# Patient Record
Sex: Male | Born: 1940
Health system: Southern US, Community
[De-identification: ages and names within clinical notes are randomized; demographics above are authoritative.]

## PROBLEM LIST (undated history)

## (undated) DIAGNOSIS — G709 Myoneural disorder, unspecified: Secondary | ICD-10-CM

## (undated) DIAGNOSIS — D689 Coagulation defect, unspecified: Secondary | ICD-10-CM

## (undated) DIAGNOSIS — I10 Essential (primary) hypertension: Secondary | ICD-10-CM

## (undated) DIAGNOSIS — T7840XA Allergy, unspecified, initial encounter: Secondary | ICD-10-CM

## (undated) DIAGNOSIS — K5790 Diverticulosis of intestine, part unspecified, without perforation or abscess without bleeding: Secondary | ICD-10-CM

## (undated) DIAGNOSIS — C61 Malignant neoplasm of prostate: Secondary | ICD-10-CM

## (undated) DIAGNOSIS — K219 Gastro-esophageal reflux disease without esophagitis: Secondary | ICD-10-CM

## (undated) DIAGNOSIS — J302 Other seasonal allergic rhinitis: Secondary | ICD-10-CM

## (undated) DIAGNOSIS — J019 Acute sinusitis, unspecified: Secondary | ICD-10-CM

## (undated) DIAGNOSIS — M1990 Unspecified osteoarthritis, unspecified site: Secondary | ICD-10-CM

## (undated) DIAGNOSIS — K4090 Unilateral inguinal hernia, without obstruction or gangrene, not specified as recurrent: Secondary | ICD-10-CM

## (undated) DIAGNOSIS — D369 Benign neoplasm, unspecified site: Secondary | ICD-10-CM

## (undated) DIAGNOSIS — E785 Hyperlipidemia, unspecified: Secondary | ICD-10-CM

## (undated) HISTORY — DX: Gastro-esophageal reflux disease without esophagitis: K21.9

## (undated) HISTORY — PX: TONSILLECTOMY: SUR1361

## (undated) HISTORY — DX: Acute sinusitis, unspecified: J01.90

## (undated) HISTORY — DX: Essential (primary) hypertension: I10

## (undated) HISTORY — DX: Myoneural disorder, unspecified: G70.9

## (undated) HISTORY — PX: EYE SURGERY: SHX253

## (undated) HISTORY — DX: Other seasonal allergic rhinitis: J30.2

## (undated) HISTORY — DX: Malignant neoplasm of prostate: C61

## (undated) HISTORY — DX: Diverticulosis of intestine, part unspecified, without perforation or abscess without bleeding: K57.90

## (undated) HISTORY — PX: HERNIA REPAIR: SHX51

## (undated) HISTORY — DX: Benign neoplasm, unspecified site: D36.9

## (undated) HISTORY — DX: Allergy, unspecified, initial encounter: T78.40XA

## (undated) HISTORY — DX: Unspecified osteoarthritis, unspecified site: M19.90

## (undated) HISTORY — DX: Coagulation defect, unspecified: D68.9

## (undated) HISTORY — PX: APPENDECTOMY: SHX54

## (undated) HISTORY — DX: Hyperlipidemia, unspecified: E78.5

---

## 1992-06-27 DIAGNOSIS — C61 Malignant neoplasm of prostate: Secondary | ICD-10-CM

## 1992-06-27 HISTORY — PX: OTHER SURGICAL HISTORY: SHX169

## 1992-06-27 HISTORY — DX: Malignant neoplasm of prostate: C61

## 1999-06-22 ENCOUNTER — Emergency Department (HOSPITAL_COMMUNITY): Admission: EM | Admit: 1999-06-22 | Discharge: 1999-06-22 | Payer: Self-pay | Admitting: Emergency Medicine

## 2002-03-04 ENCOUNTER — Ambulatory Visit (HOSPITAL_COMMUNITY): Admission: RE | Admit: 2002-03-04 | Discharge: 2002-03-04 | Payer: Self-pay | Admitting: Internal Medicine

## 2002-04-10 ENCOUNTER — Encounter: Admission: RE | Admit: 2002-04-10 | Discharge: 2002-04-10 | Payer: Self-pay | Admitting: Internal Medicine

## 2002-04-15 ENCOUNTER — Ambulatory Visit (HOSPITAL_COMMUNITY): Admission: RE | Admit: 2002-04-15 | Discharge: 2002-04-15 | Payer: Self-pay | Admitting: Internal Medicine

## 2002-04-23 ENCOUNTER — Ambulatory Visit: Admission: RE | Admit: 2002-04-23 | Discharge: 2002-04-23 | Payer: Self-pay | Admitting: Internal Medicine

## 2002-04-23 ENCOUNTER — Encounter (INDEPENDENT_AMBULATORY_CARE_PROVIDER_SITE_OTHER): Payer: Self-pay

## 2004-05-06 ENCOUNTER — Ambulatory Visit: Payer: Self-pay | Admitting: Internal Medicine

## 2006-06-27 DIAGNOSIS — D369 Benign neoplasm, unspecified site: Secondary | ICD-10-CM

## 2006-06-27 HISTORY — DX: Benign neoplasm, unspecified site: D36.9

## 2006-12-07 ENCOUNTER — Ambulatory Visit: Payer: Self-pay | Admitting: Internal Medicine

## 2006-12-07 LAB — CONVERTED CEMR LAB
ALT: 16 units/L (ref 0–40)
AST: 22 units/L (ref 0–37)
Albumin: 3.6 g/dL (ref 3.5–5.2)
Alkaline Phosphatase: 47 units/L (ref 39–117)
BUN: 11 mg/dL (ref 6–23)
Basophils Absolute: 0 10*3/uL (ref 0.0–0.1)
Basophils Relative: 0.2 % (ref 0.0–1.0)
Bilirubin, Direct: 0.1 mg/dL (ref 0.0–0.3)
CO2: 29 meq/L (ref 19–32)
Calcium: 8.9 mg/dL (ref 8.4–10.5)
Chloride: 102 meq/L (ref 96–112)
Cholesterol: 200 mg/dL (ref 0–200)
Creatinine, Ser: 1.1 mg/dL (ref 0.4–1.5)
Eosinophils Absolute: 0.1 10*3/uL (ref 0.0–0.6)
Eosinophils Relative: 2.3 % (ref 0.0–5.0)
GFR calc Af Amer: 86 mL/min
GFR calc non Af Amer: 71 mL/min
Glucose, Bld: 85 mg/dL (ref 70–99)
HCT: 42.5 % (ref 39.0–52.0)
HDL: 57.1 mg/dL (ref >39.0)
Hemoglobin: 15.2 g/dL (ref 13.0–17.0)
LDL Cholesterol: 131 mg/dL — ABNORMAL HIGH (ref 0–99)
Lymphocytes Relative: 35.4 % (ref 12.0–46.0)
MCHC: 35.8 g/dL (ref 30.0–36.0)
MCV: 88.4 fL (ref 78.0–100.0)
Monocytes Absolute: 0.5 10*3/uL (ref 0.2–0.7)
Monocytes Relative: 8.4 % (ref 3.0–11.0)
Neutro Abs: 3.1 10*3/uL (ref 1.4–7.7)
Neutrophils Relative %: 53.7 % (ref 43.0–77.0)
PSA: 1.65 ng/mL (ref 0.10–4.00)
Platelets: 209 10*3/uL (ref 150–400)
Potassium: 3.9 meq/L (ref 3.5–5.1)
RBC: 4.81 M/uL (ref 4.22–5.81)
RDW: 12.2 % (ref 11.5–14.6)
Sodium: 138 meq/L (ref 135–145)
TSH: 3.02 microintl units/mL (ref 0.35–5.50)
Total Bilirubin: 1.8 mg/dL — ABNORMAL HIGH (ref 0.3–1.2)
Total CHOL/HDL Ratio: 3.5
Total Protein: 6.8 g/dL (ref 6.0–8.3)
Triglycerides: 58 mg/dL (ref 0–149)
VLDL: 12 mg/dL (ref 0–40)
WBC: 5.7 10*3/uL (ref 4.5–10.5)

## 2007-02-07 ENCOUNTER — Ambulatory Visit: Payer: Self-pay | Admitting: Gastroenterology

## 2007-02-21 ENCOUNTER — Encounter: Payer: Self-pay | Admitting: Gastroenterology

## 2007-02-21 ENCOUNTER — Ambulatory Visit: Payer: Self-pay | Admitting: Gastroenterology

## 2007-02-21 ENCOUNTER — Encounter: Payer: Self-pay | Admitting: Internal Medicine

## 2007-02-21 LAB — HM COLONOSCOPY

## 2007-03-26 DIAGNOSIS — K219 Gastro-esophageal reflux disease without esophagitis: Secondary | ICD-10-CM | POA: Insufficient documentation

## 2007-03-26 DIAGNOSIS — K5730 Diverticulosis of large intestine without perforation or abscess without bleeding: Secondary | ICD-10-CM | POA: Insufficient documentation

## 2007-05-16 ENCOUNTER — Telehealth: Payer: Self-pay | Admitting: Internal Medicine

## 2007-05-16 ENCOUNTER — Ambulatory Visit: Payer: Self-pay | Admitting: Internal Medicine

## 2007-05-16 LAB — CONVERTED CEMR LAB
ALT: 230 units/L — ABNORMAL HIGH (ref 0–53)
AST: 277 units/L — ABNORMAL HIGH (ref 0–37)
Albumin: 4 g/dL (ref 3.5–5.2)
Alkaline Phosphatase: 89 units/L (ref 39–117)
Amylase: 3384 units/L — ABNORMAL HIGH (ref 27–131)
BUN: 12 mg/dL (ref 6–23)
Basophils Absolute: 0 10*3/uL (ref 0.0–0.1)
Basophils Relative: 0 % (ref 0.0–1.0)
Bilirubin, Direct: 0.9 mg/dL — ABNORMAL HIGH (ref 0.0–0.3)
CO2: 31 meq/L (ref 19–32)
Calcium: 10 mg/dL (ref 8.4–10.5)
Chloride: 98 meq/L (ref 96–112)
Creatinine, Ser: 1.1 mg/dL (ref 0.4–1.5)
Eosinophils Absolute: 0 10*3/uL (ref 0.0–0.6)
Eosinophils Relative: 0.3 % (ref 0.0–5.0)
GFR calc Af Amer: 86 mL/min
GFR calc non Af Amer: 71 mL/min
Glucose, Bld: 102 mg/dL — ABNORMAL HIGH (ref 70–99)
HCT: 44 % (ref 39.0–52.0)
Hemoglobin: 15.8 g/dL (ref 13.0–17.0)
Lymphocytes Relative: 12.2 % (ref 12.0–46.0)
MCHC: 35.9 g/dL (ref 30.0–36.0)
MCV: 91.2 fL (ref 78.0–100.0)
Monocytes Absolute: 0.9 10*3/uL — ABNORMAL HIGH (ref 0.2–0.7)
Monocytes Relative: 7.1 % (ref 3.0–11.0)
Neutro Abs: 9.8 10*3/uL — ABNORMAL HIGH (ref 1.4–7.7)
Neutrophils Relative %: 80.4 % — ABNORMAL HIGH (ref 43.0–77.0)
PSA: 1.46 ng/mL (ref 0.10–4.00)
Platelets: 215 10*3/uL (ref 150–400)
Potassium: 4 meq/L (ref 3.5–5.1)
RBC: 4.82 M/uL (ref 4.22–5.81)
RDW: 12.3 % (ref 11.5–14.6)
Sodium: 137 meq/L (ref 135–145)
Total Bilirubin: 3.5 mg/dL — ABNORMAL HIGH (ref 0.3–1.2)
Total Protein: 7.2 g/dL (ref 6.0–8.3)
WBC: 12.2 10*3/uL — ABNORMAL HIGH (ref 4.5–10.5)

## 2007-05-17 ENCOUNTER — Telehealth: Payer: Self-pay | Admitting: Internal Medicine

## 2007-05-17 ENCOUNTER — Encounter: Admission: RE | Admit: 2007-05-17 | Discharge: 2007-05-17 | Payer: Self-pay | Admitting: Internal Medicine

## 2007-05-18 ENCOUNTER — Ambulatory Visit: Payer: Self-pay | Admitting: Internal Medicine

## 2007-05-18 LAB — CONVERTED CEMR LAB
ALT: 122 units/L — ABNORMAL HIGH (ref 0–53)
AST: 62 units/L — ABNORMAL HIGH (ref 0–37)
Albumin: 4 g/dL (ref 3.5–5.2)
Bilirubin, Direct: 0.3 mg/dL (ref 0.0–0.3)
Lipase: 47 units/L (ref 11.0–59.0)
Total Bilirubin: 2 mg/dL — ABNORMAL HIGH (ref 0.3–1.2)

## 2007-05-19 ENCOUNTER — Encounter: Payer: Self-pay | Admitting: Internal Medicine

## 2007-06-01 ENCOUNTER — Telehealth: Payer: Self-pay | Admitting: Internal Medicine

## 2007-06-05 ENCOUNTER — Encounter (INDEPENDENT_AMBULATORY_CARE_PROVIDER_SITE_OTHER): Payer: Self-pay | Admitting: General Surgery

## 2007-06-05 ENCOUNTER — Ambulatory Visit (HOSPITAL_COMMUNITY): Admission: RE | Admit: 2007-06-05 | Discharge: 2007-06-05 | Payer: Self-pay | Admitting: General Surgery

## 2007-08-06 DIAGNOSIS — J309 Allergic rhinitis, unspecified: Secondary | ICD-10-CM | POA: Insufficient documentation

## 2009-03-10 ENCOUNTER — Ambulatory Visit: Payer: Self-pay | Admitting: Internal Medicine

## 2009-03-10 DIAGNOSIS — E785 Hyperlipidemia, unspecified: Secondary | ICD-10-CM | POA: Insufficient documentation

## 2009-03-19 ENCOUNTER — Telehealth: Payer: Self-pay | Admitting: Internal Medicine

## 2009-03-27 LAB — CONVERTED CEMR LAB
AST: 24 units/L (ref 0–37)
Basophils Absolute: 0 10*3/uL (ref 0.0–0.1)
CO2: 32 meq/L (ref 19–32)
Calcium: 9 mg/dL (ref 8.4–10.5)
Eosinophils Absolute: 0.1 10*3/uL (ref 0.0–0.7)
Eosinophils Relative: 2.4 % (ref 0.0–5.0)
GFR calc non Af Amer: 63.53 mL/min (ref >60)
Glucose, Bld: 94 mg/dL (ref 70–99)
HDL: 46 mg/dL (ref >39.00)
LDL Cholesterol: 120 mg/dL — ABNORMAL HIGH (ref 0–99)
Lymphocytes Relative: 31.8 % (ref 12.0–46.0)
Monocytes Absolute: 0.6 10*3/uL (ref 0.1–1.0)
Neutrophils Relative %: 55.4 % (ref 43.0–77.0)
Platelets: 188 10*3/uL (ref 150.0–400.0)
Potassium: 4.7 meq/L (ref 3.5–5.1)
Sodium: 139 meq/L (ref 135–145)
Total Bilirubin: 2.3 mg/dL — ABNORMAL HIGH (ref 0.3–1.2)
Total Protein: 7.4 g/dL (ref 6.0–8.3)
Triglycerides: 93 mg/dL (ref 0.0–149.0)
VLDL: 18.6 mg/dL (ref 0.0–40.0)

## 2009-04-28 ENCOUNTER — Telehealth: Payer: Self-pay | Admitting: Internal Medicine

## 2009-05-27 HISTORY — PX: CHOLECYSTECTOMY: SHX55

## 2009-10-14 ENCOUNTER — Ambulatory Visit: Payer: Self-pay | Admitting: Internal Medicine

## 2009-11-10 ENCOUNTER — Ambulatory Visit: Payer: Self-pay | Admitting: Internal Medicine

## 2009-11-10 DIAGNOSIS — M549 Dorsalgia, unspecified: Secondary | ICD-10-CM | POA: Insufficient documentation

## 2009-11-10 DIAGNOSIS — C61 Malignant neoplasm of prostate: Secondary | ICD-10-CM | POA: Insufficient documentation

## 2009-12-24 ENCOUNTER — Telehealth: Payer: Self-pay | Admitting: Internal Medicine

## 2010-02-22 ENCOUNTER — Telehealth: Payer: Self-pay | Admitting: Internal Medicine

## 2010-02-23 ENCOUNTER — Ambulatory Visit: Payer: Self-pay | Admitting: Internal Medicine

## 2010-02-24 ENCOUNTER — Encounter (INDEPENDENT_AMBULATORY_CARE_PROVIDER_SITE_OTHER): Payer: Self-pay | Admitting: *Deleted

## 2010-03-16 ENCOUNTER — Ambulatory Visit: Payer: Self-pay | Admitting: Family Medicine

## 2010-03-16 DIAGNOSIS — J209 Acute bronchitis, unspecified: Secondary | ICD-10-CM | POA: Insufficient documentation

## 2010-05-25 ENCOUNTER — Encounter (INDEPENDENT_AMBULATORY_CARE_PROVIDER_SITE_OTHER): Payer: Self-pay | Admitting: *Deleted

## 2010-06-01 ENCOUNTER — Encounter (INDEPENDENT_AMBULATORY_CARE_PROVIDER_SITE_OTHER): Payer: Self-pay | Admitting: *Deleted

## 2010-06-02 ENCOUNTER — Ambulatory Visit: Payer: Self-pay | Admitting: Gastroenterology

## 2010-06-18 ENCOUNTER — Ambulatory Visit: Payer: Self-pay | Admitting: Gastroenterology

## 2010-06-30 ENCOUNTER — Encounter: Payer: Self-pay | Admitting: Gastroenterology

## 2010-07-18 ENCOUNTER — Encounter: Payer: Self-pay | Admitting: Internal Medicine

## 2010-07-27 NOTE — Progress Notes (Signed)
Summary: rx for vaccine  Phone Note Call from Patient Call back at Home Phone [phone]   Caller: Patient Call For: Birdie Sons MD Summary of Call: pt would like rx [facility] [phone]  for shingle vaccine. please call pt back Initial call taken by: Heron Sabins,  February 22, 2010 10:44 AM  Follow-up for Phone Call        Wife notified we have vaccine here and needs appt to get it. Follow-up by: Gladis Riffle, RN,  February 22, 2010 2:27 PM

## 2010-07-27 NOTE — Assessment & Plan Note (Signed)
Summary: shingles vacc/cjr  Nurse Visit   Allergies: No Known Drug Allergies  Immunizations Administered:  Zostavax # 1:    Vaccine Type: Zostavax    Site: left deltoid    Mfr: Merck    Dose: 0.5 ml    Route: [place]    Given by: Gladis Riffle, RN    Exp. Date: 01/29/2011    Lot #: [id number]    VIS given: 04/08/05 given February 23, 2010.  Orders Added: 1)  Zoster (Shingles) Vaccine Live [90736] 2)  Admin 1st Vaccine [phone]

## 2010-07-27 NOTE — Letter (Signed)
Summary: [facility] Instructions  [facility]  [address] [place], [place] [address]   Phone: [phone]  Fax: [phone]       Juan Lin    1940/07/23    MRN: [id number]        Procedure Day Dorna Bloom:  Farrell Ours  06/18/10     Arrival Time:  2:30pm     Procedure Time: 3:30pm     Location of Procedure:                    Juliann Pares  [facility] (4th Floor)                       PREPARATION FOR COLONOSCOPY WITH MOVIPREP   Starting 5 days prior to your procedure  SUNDAY 12/18  do not eat nuts, seeds, popcorn, corn, beans, peas,  salads, or any raw vegetables.  Do not take any fiber supplements (e.g. Metamucil, Citrucel, and Benefiber).  THE DAY BEFORE YOUR PROCEDURE         DATE:  THURSDAY 12/22  1.  Drink clear liquids the entire day-NO SOLID FOOD  2.  Do not drink anything colored red or purple.  Avoid juices with pulp.  No orange juice.  3.  Drink at least 64 oz. (8 glasses) of fluid/clear liquids during the day to prevent dehydration and help the prep work efficiently.  CLEAR LIQUIDS INCLUDE: Water Jello Ice Popsicles Tea (sugar ok, no milk/cream) Powdered fruit flavored drinks Coffee (sugar ok, no milk/cream) Gatorade Juice: apple, white grape, white cranberry  Lemonade Clear bullion, consomm, broth Carbonated beverages (any kind) Strained chicken noodle soup Hard Candy                             4.  In the morning, mix first dose of MoviPrep solution:    Empty 1 Pouch A and 1 Pouch B into the disposable container    Add lukewarm drinking water to the top line of the container. Mix to dissolve    Refrigerate (mixed solution should be used within 24 hrs)  5.  Begin drinking the prep at 5:00 p.m. The MoviPrep container is divided by 4 marks.   Every 15 minutes drink the solution down to the next mark (approximately 8 oz) until the full liter is complete.   6.  Follow completed prep with 16 oz of clear liquid of your choice (Nothing  red or purple).  Continue to drink clear liquids until bedtime.  7.  Before going to bed, mix second dose of MoviPrep solution:    Empty 1 Pouch A and 1 Pouch B into the disposable container    Add lukewarm drinking water to the top line of the container. Mix to dissolve    Refrigerate  THE DAY OF YOUR PROCEDURE      DATE:  FRIDAY  12/23  Beginning at  10:30 a.m. (5 hours before procedure):         1. Every 15 minutes, drink the solution down to the next mark (approx 8 oz) until the full liter is complete.  2. Follow completed prep with 16 oz. of clear liquid of your choice.    3. You may drink clear liquids until  1:30pm  (2 HOURS BEFORE PROCEDURE).   MEDICATION INSTRUCTIONS  Unless otherwise instructed, you should take regular prescription medications with a small sip of water   as early  as possible the morning of your procedure.         OTHER INSTRUCTIONS  You will need a responsible adult at least 72 years of age to accompany you and drive you home.   This person must remain in the waiting room during your procedure.  Wear loose fitting clothing that is easily removed.  Leave jewelry and other valuables at home.  However, you may wish to bring a book to read or  an iPod/MP3 player to listen to music as you wait for your procedure to start.  Remove all body piercing jewelry and leave at home.  Total time from sign-in until discharge is approximately 2-3 hours.  You should go home directly after your procedure and rest.  You can resume normal activities the  day after your procedure.  The day of your procedure you should not:   Drive   Make legal decisions   Operate machinery   Drink alcohol   Return to work  You will receive specific instructions about eating, activities and medications before you leave.    The above instructions have been reviewed and explained to me by   Wyona Almas RN  June 02, 2010 9:31 AM     I fully understand and  can verbalize these instructions _____________________________ Date _________

## 2010-07-27 NOTE — Letter (Signed)
Summary: Colonoscopy Letter  [facility]  [address] [place], [place] [address]   Phone: [phone]  Fax: [phone]      February 24, 2010 MRN: [id number]   Juan Lin [address] [place], [place]  [address]   Dear Mr. VELAZCO,   According to your medical record, it is time for you to schedule a Colonoscopy. The American Cancer Society recommends this procedure as a method to detect early colon cancer. Patients with a family history of colon cancer, or a personal history of colon polyps or inflammatory bowel disease are at increased risk.  This letter has beeen generated based on the recommendations made at the time of your procedure. If you feel that in your particular situation this may no longer apply, please contact our office.  Please call our office at [phone] to schedule this appointment or to update your records at your earliest convenience.  Thank you for cooperating with [place] to provide you with the very best care possible.   Sincerely,   Vania Rea. Jarold Motto, M.D.  [facility] Gastroenterology Division [phone]

## 2010-07-27 NOTE — Progress Notes (Signed)
Summary: REQ FOR SHINGLES VACCINE (ZOSTAVAX)  Phone Note Call from Patient   Caller: Patient  [phone] Reason for Call: Talk to Nurse, Talk to Doctor Summary of Call: Pt called to ck on receiving shingles (zostavax) vaccine or a Rx for same.... Pt can be reached at [phone] when / if available.Marland KitchenMarland KitchenMarland KitchenPt understands that Dr Cato Mulligan / Alvino Chapel, RN out of office till 12/29/2009... Pt adv he would wait to speak with them once they return.  Initial call taken by: Debbra Riding,  December 24, 2009 10:41 AM  Follow-up for Phone Call        Left message that either is possible as of right now.  He is to check with insurance for coverage and let us know which he would like to do. Follow-up by: Gladis Riffle, RN,  December 30, 2009 4:18 PM

## 2010-07-27 NOTE — Miscellaneous (Signed)
Summary: [facility] Previsit/prep  Clinical Lists Changes  Medications: Added new medication of MOVIPREP 100 GM  SOLR (PEG-KCL-NACL-NASULF-NA ASC-C) As per prep instructions. - Signed Rx of MOVIPREP 100 GM  SOLR (PEG-KCL-NACL-NASULF-NA ASC-C) As per prep instructions.;  #1 x 0;  Signed;  Entered by: Wyona Almas RN;  Authorized by: Mardella Layman MD [facility];  Method used: Electronically to [id number] [address], [place], [place], [place]  [address], Ph: [phone] or [phone], Fax: [phone] Observations: Added new observation of NKA: T (06/02/2010 8:48)    Prescriptions: MOVIPREP 100 GM  SOLR (PEG-KCL-NACL-NASULF-NA ASC-C) As per prep instructions.  #1 x 0   Entered by:   Wyona Almas RN   Authorized by:   Mardella Layman MD [facility]   Signed by:   Wyona Almas RN on 06/02/2010   Method used:   Electronically to        [facility] [address] [phone]* (retail)       [address]       [place], [place]  [address]       Ph: [phone] or [phone]       Fax: [phone]   RxID:   [id number]

## 2010-07-27 NOTE — Assessment & Plan Note (Signed)
Summary: COUGH, CONGESTION // RS   Vital Signs:  Patient profile:   70 year old male Height:      73.5 inches Weight:      170 pounds Temp:     98.2 degrees F oral Pulse rate:   84 / minute Pulse rhythm:   regular Resp:     12 per minute BP sitting:   112 / 74  (left arm) Cuff size:   regular  Vitals Entered By: Gladis Riffle, RN (October 14, 2009 1:02 PM) CC: c/o cough and congestion--taking mucinex, loratidine with some relief--now when blows nose gets bloody drainage Is Patient Diabetic? No Comments all began with tree pollen   Primary Care Provider:  [facility]  CC:  c/o cough and congestion--taking mucinex and loratidine with some relief--now when blows nose gets bloody drainage.  History of Present Illness: not feeling normal for 2weeks---initially thought allergy related 9 days ago developed fever 101---resolved since then has had a cough---can be productive of white mucous (foamy) he does have some sinus congestion mucopurulent with bloody discharge no fever or chills recently  All other systems reviewed and were negative   Preventive Screening-Counseling & Management  Alcohol-Tobacco     Smoking Status: never  Current Problems (verified): 1)  Hyperlipidemia  (ICD-272.4) 2)  Colonic Polyps, Adenomatous, Serrated  (ICD-211.3) 3)  Allergic Rhinitis  (ICD-477.9) 4)  Colonic Polyps, Benign, Hx Of, Lymphoid Nodule  (ICD-V12.72) 5)  Gerd  (ICD-530.81) 6)  Diverticulosis, Colon  (ICD-562.10) 7)  Prostate Cancer, Hx of  (ICD-V10.46)  Current Medications (verified): 1)  Multivitamins   Tabs (Multiple Vitamin) .... Once Daily 2)  Fish Oil 500 Mg  Caps (Omega-3 Fatty Acids) .... Once Daily 3)  Glucosamine-Chondroitin 500-400 Mg  Tabs (Glucosamine-Chondroitin) .... Once Daily 4)  Loratadine 10 Mg  Tabs (Loratadine) .... Once Daily As Needed Allergies 5)  Aleve 220 Mg Tabs (Naproxen Sodium) .... At Bedtime 6)  Mucinex 600 Mg Xr12h-Tab (Guaifenesin) .... Two Times A Day As  Needed 7)  Claritin 10 Mg Tabs (Loratadine) .... Once Daily As Needed  Allergies (verified): No Known Drug Allergies  Past History:  Past Medical History: Last updated: 03/10/2009 Prostate cancer, hx of Diverticulosis, colon Sinusitis GERD Allergies Serrated adenoma of colon (2008) Hyperlipidemia  Past Surgical History: Last updated: 03/10/2009 Colonoscopy 02/21/07 Prostatectomy 1994  Family History: Last updated: 03/26/2007 Family History of Respiratory disease Family History of Cardiovascular disorder  Social History: Last updated: 03/26/2007 Retired Married Never Smoked Alcohol use-yes  Risk Factors: Smoking Status: never (10/14/2009)  Physical Exam  General:  Well developed, well nourished, no acute distress. Head:  normocephalic and atraumatic.   Eyes:  pupils equal and pupils round.   Ears:  R ear normal and L ear normal.   Neck:  Supple; no masses or thyromegaly. Lungs:  normal respiratory effort and no intercostal retractions.   Heart:  normal rate and regular rhythm.   Abdomen:  soft and non-tender.   Msk:  No deformity or scoliosis noted of thoracic or lumbar spine.   Skin:  turgor normal and color normal.     Impression & Recommendations:  Problem # 1:  SINUSITIS- ACUTE-NOS (ICD-461.9)  trial ABX side effects discussed salt water nose spray.  His updated medication list for this problem includes:    Mucinex 600 Mg Xr12h-tab (Guaifenesin) .[name]..[name] Two times a day as needed    Doxycycline Hyclate 100 Mg Caps (Doxycycline hyclate) .[name]... Take 1 tab twice a day  Orders: Prescription Created Electronically (  [email or web address])  Problem # 2:  PROSTATE CANCER, HX OF (ICD-V10.46) check psa today Orders: Venipuncture ([address]) TLB-PSA (Prostate Specific Antigen) (84153-PSA)  Complete Medication List: 1)  Multivitamins Tabs (Multiple vitamin) .... Once daily 2)  Fish Oil 500 Mg Caps (Omega-3 fatty acids) .... Once daily 3)  Glucosamine-chondroitin 500-400 Mg  Tabs (Glucosamine-chondroitin) .... Once daily 4)  Loratadine 10 Mg Tabs (Loratadine) .... Once daily as needed allergies 5)  Aleve 220 Mg Tabs (Naproxen sodium) .... At bedtime 6)  Mucinex 600 Mg Xr12h-tab (Guaifenesin) .... Two times a day as needed 7)  Claritin 10 Mg Tabs (Loratadine) .... Once daily as needed 8)  Doxycycline Hyclate 100 Mg Caps (Doxycycline hyclate) .... Take 1 tab twice a day  Patient Instructions: 1)  . Prescriptions: DOXYCYCLINE HYCLATE 100 MG CAPS (DOXYCYCLINE HYCLATE) Take 1 tab twice a day  #20 x 0   Entered and Authorized by:   Birdie Sons MD   Signed by:   Birdie Sons MD on 10/14/2009   Method used:   Electronically to        [facility] [address] [phone]* (retail)       [address]       [place], [place]  [address]       Ph: [phone] or [phone]       Fax: [phone]   RxID:   [id number]

## 2010-07-27 NOTE — Assessment & Plan Note (Signed)
Summary: strange sensation in legs/feels like legs going to sleep/cjr   Vital Signs:  Patient profile:   70 year old male Height:      72.5 inches Weight:      165 pounds BMI:     22.15 Pulse rate:   86 / minute Pulse rhythm:   regular Resp:     12 per minute BP sitting:   116 / 74  (left arm) Cuff size:   regular  Vitals Entered By: Gladis Riffle, RN (Nov 10, 2009 8:38 AM) CC: c/o feeling of left leg going to sleep Is Patient Diabetic? No   Primary Care Provider:  Montavis Schubring  CC:  c/o feeling of left leg going to sleep.  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 Once Daily 2)  Fish Oil 500 Mg  Caps (Omega-3 Fatty Acids) .... Once Daily 3)  Glucosamine-Chondroitin 500-400 Mg  Tabs (Glucosamine-Chondroitin) .... Once Daily 4)  Loratadine 10 Mg  Tabs (Loratadine) .... Once Daily As Needed Allergies 5)  Aleve 220 Mg Tabs (Naproxen Sodium) .... At Bedtime  Allergies (verified): No Known Drug Allergies  Past History:  Past Medical History: Last updated: 03/10/2009 Prostate cancer, hx of Diverticulosis, colon Sinusitis GERD Allergies Serrated adenoma of colon  (2008) Hyperlipidemia  Past Surgical History: Last updated: 03/10/2009 Colonoscopy 02/21/07 Prostatectomy 1994  Family History: Last updated: 03/26/2007 Family History of Respiratory disease Family History of Cardiovascular disorder  Social History: Last updated: 03/26/2007 Retired Married Never Smoked Alcohol use-yes  Risk Factors: Smoking Status: never (11/10/2009)  Physical Exam  General:  alert and well-developed.   Head:  normocephalic and atraumatic.   Eyes:  pupils equal and pupils round.   Ears:  R ear normal and L ear normal.   Neck:  Supple; no masses or thyromegaly. Abdomen:  soft and non-tender.   Msk:  FROM both hips Neurologic:  cranial nerves II-XII intact, gait normal, and DTRs symmetrical and normal.  AJ and KJ Skin:  turgor normal and color normal.   Psych:  memory intact for recent and remote and normally interactive.     Impression & Recommendations:  Problem # 1:  ADENOCARCINOMA, PROSTATE (ICD-185) unclear about rising PSA and what that means in context of hip pain discussed will need bone survey/scan---discuss with radiology Radionuclide bone scan   Problem # 2:  BACK PAIN (ICD-724.5) radicular sxs His updated medication list for this problem includes:    Diclofenac Sodium 75 Mg Tbec (Diclofenac sodium) .[name]... Take 1 tablet by mouth two times a day  Complete Medication List: 1)  Multivitamins Tabs (Multiple vitamin) .... Once daily 2)  Fish Oil 500 Mg Caps (Omega-3 fatty acids) .... Once daily 3)  Glucosamine-chondroitin 500-400 Mg Tabs (Glucosamine-chondroitin) .... Once  daily 4)  Loratadine 10 Mg Tabs (Loratadine) .... Once daily as needed allergies 5)  Diclofenac Sodium 75 Mg Tbec (Diclofenac sodium) .... Take 1 tablet by mouth two times a day  Patient Instructions: 1)  we will call you about radiology study Prescriptions: DICLOFENAC SODIUM 75 MG TBEC (DICLOFENAC SODIUM) Take 1 tablet by mouth two times a day  #40 x 1   Entered and  Authorized by:   Birdie Sons MD   Signed by:   Birdie Sons MD on 11/10/2009   Method used:   Electronically to        [facility] [address] [phone]* (retail)       [address]       [place], [place]  [address]       Ph: [phone] or [phone]       Fax: [phone]   RxID:   [id number]

## 2010-07-27 NOTE — Assessment & Plan Note (Signed)
Summary: still has sinus headaches/cannot sleep//ccm   Vital Signs:  Patient profile:   70 year old male Height:      72.5 inches (184.15 cm) Weight:      174 pounds (79.09 kg) O2 Sat:      98 % on Room air Temp:     98.0 degrees F (36.67 degrees C) oral Pulse rate:   81 / minute BP sitting:   150 / 88  (left arm) Cuff size:   regular  Vitals Entered By: Josph Macho RMA (March 16, 2010 12:45 PM)  O2 Flow:  Room air CC: sinus headaches/ can't sleep/ CF Is Patient Diabetic? No   History of Present Illness: Patient in today to she has symptoms of sinus congestion and headache evaluate. He reports a history of a similar episode in the past she would choose lower respiratory symptoms and was ultimately treated with antibiotics to that effect area and he reports he's been having trouble for about a week now. He first noticed pain in his ears bilaterally week ago then developed a generalized headache and pressure behind his eyes. The headache can occur throughout the day but over the last few days has been waking him up for him and has been uncomfortable and asked to keep him from going back to sleep. He is noted mild sore throat intermittently some persistent nasal congestion. He denies fevers, chills, visual changes, chest pain, palpitations, shortness of breath, wheezing does note a mild nonproductive cough is occurring occasionally. Denies any abdominal pain, GI or GU complaints at this time. Has tried Claritin in the morning occasional nasal saline and some occasional Benadryl at night with partial effect. Took some Advil this morning for a headache and got some temporary relief only to have the headache returned. He has been taking Mucinex intermittently for the last week as well.  Current Medications (verified): 1)  Multivitamins   Tabs (Multiple Vitamin) .... Once Daily 2)  Fish Oil 500 Mg  Caps (Omega-3 Fatty Acids) .... Once Daily 3)  Glucosamine-Chondroitin 500-400 Mg  Tabs  (Glucosamine-Chondroitin) .... Once Daily 4)  Loratadine 10 Mg  Tabs (Loratadine) .... Once Daily As Needed Allergies 5)  Diclofenac Sodium 75 Mg Tbec (Diclofenac Sodium) .... Take 1 Tablet By Mouth Two Times A Day  Allergies (verified): No Known Drug Allergies  Past History:  Past medical history reviewed for relevance to current acute and chronic problems. Social history (including risk factors) reviewed for relevance to current acute and chronic problems.  Past Medical History: Reviewed history from 03/10/2009 and no changes required. Prostate cancer, hx of Diverticulosis, colon Sinusitis GERD Allergies Serrated adenoma of colon (2008) Hyperlipidemia  Social History: Reviewed history from 03/26/2007 and no changes required. Retired Married Never Smoked Alcohol use-yes  Review of Systems      See HPI  Physical Exam  General:  Well-developed,well-nourished,in no acute distress; alert,appropriate and cooperative throughout examination Head:  Normocephalic and atraumatic without obvious abnormalities. No apparent alopecia or balding. Eyes:  No corneal or conjunctival inflammation noted. EOMI. Perrla. Funduscopic exam benign, without hemorrhages, exudates or papilledema. Vision grossly normal. Ears:  R ear normal and L Cerumen impaction.   Nose:  nasal dischargemucosal pallor.   Mouth:  Oral mucosa and oropharynx without lesions or exudates.  Teeth in good repair. Neck:  No deformities, masses. Anterior Cervical LN mildly tender to palp b/l Lungs:  R decreased breath sounds and L decreased breath sounds at bases. All else CTA Heart:  Normal rate and  regular rhythm. S1 and S2 normal without gallop, murmur, click, rub or other extra sounds. Abdomen:  Bowel sounds positive,abdomen soft and non-tender without masses, organomegaly or hernias noted. Extremities:  No clubbing, cyanosis, edema, or deformity noted  Cervical Nodes:  R anterior LN tender and L anterior LN tender.     Psych:  Cognition and judgment appear intact. Alert and cooperative with normal attention span and concentration. No apparent delusions, illusions, hallucinations   Impression & Recommendations:  Problem # 1:  ACUTE BRONCHITIS (ICD-466.0)  His updated medication list for this problem includes:    Cipro 500 Mg Tabs (Ciprofloxacin hcl) .[name]... 1 tab by mouth two times a day x 10 day Mucinex two times a day x 10 days  Problem # 2:  ALLERGIC RHINITIS (ICD-477.9)  His updated medication list for this problem includes:    Loratadine 10 Mg Tabs (Loratadine) ..... Once daily as needed allergies and may take Benadryl at bedtime and nasal saline two times a day during acute illness.  Complete Medication List: 1)  Multivitamins Tabs (Multiple vitamin) .... Once daily 2)  Fish Oil 500 Mg Caps (Omega-3 fatty acids) .... Once daily 3)  Glucosamine-chondroitin 500-400 Mg Tabs (Glucosamine-chondroitin) .... Once daily 4)  Loratadine 10 Mg Tabs (Loratadine) .... Once daily as needed allergies 5)  Diclofenac Sodium 75 Mg Tbec (Diclofenac sodium) .... Take 1 tablet by mouth two times a day 6)  Cipro 500 Mg Tabs (Ciprofloxacin hcl) .[name].. 1 tab by mouth two times a day x 10 day  Patient Instructions: 1)  Please schedule a follow-up appointment as needed if symptoms worsen or do not improve 2)  Take 650 - 1000 mg of tylenol every 4-6 hours as needed for relief of pain or comfort of fever. Avoid taking more than 3000 mg in a 24 hour period( can cause liver damage in higher doses).  3)  May alternate Tylenol with Aleve (generic name is Naproxen) do not take with Advil/Motrin (generic Ibuprofen), do not take Diclofenac with any 4)  Take a probiotic such as Align capsules daily whenever you take an antibiotic 5)  May flush ear wax out of left ear with hYDROGEN PEROXIDE AND WARM wATER and return if no relief 6)  Plain Mucinex two times a day for next 10 days as well Prescriptions: CIPRO 500 MG TABS  (CIPROFLOXACIN HCL) 1 tab by mouth two times a day x 10 day  #20 x 0   Entered and Authorized by:   Danise Edge MD   Signed by:   Danise Edge MD on 03/16/2010   Method used:   Electronically to        [facility] [phone]* (retail)       [address]       [place], [place]  [address]       Ph: [phone] or [phone]       Fax: [phone]   RxID:   [id number]

## 2010-07-27 NOTE — Letter (Signed)
Summary: Pre Visit Letter Revised  [facility]  [address] [place], [place] [address]   Phone: [phone]  Fax: [phone]        05/25/2010 MRN: [id number] Juan Lin [address] [place], [place]  [address]             Procedure Date:  06/18/2010    Welcome to the Gastroenterology Division at [facility].    You are scheduled to see a nurse for your pre-procedure visit on 06/02/2010 at 9:00 AM on the 3rd floor at [facility], [address]  We ask that you try to arrive at our office 15 minutes prior to your appointment time to allow for check-in.  Please take a minute to review the attached form.  If you answer "Yes" to one or more of the questions on the first page, we ask that you call the person listed at your earliest opportunity.  If you answer "No" to all of the questions, please complete the rest of the form and bring it to your appointment.    Your nurse visit will consist of discussing your medical and surgical history, your immediate family medical history, and your medications.   If you are unable to list all of your medications on the form, please bring the medication bottles to your appointment and we will list them.  We will need to be aware of both prescribed and over the counter drugs.  We will need to know exact dosage information as well.    Please be prepared to read and sign documents such as consent forms, a financial agreement, and acknowledgement forms.  If necessary, and with your consent, a friend or relative is welcome to sit-in on the nurse visit with you.  Please bring your insurance card so that we may make a copy of it.  If your insurance requires a referral to see a specialist, please bring your referral form from your primary care physician.  No co-pay is required for this nurse visit.     If you cannot keep your appointment, please call [phone] to cancel or reschedule prior to your appointment date.  This  allows [place] the opportunity to schedule an appointment for another patient in need of care.    Thank you for choosing [facility] for your medical needs.  We appreciate the opportunity to care for you.  Please visit [place] at our website  to learn more about our practice.  Sincerely, The Gastroenterology Division

## 2010-07-29 NOTE — Procedures (Signed)
Summary: Colonoscopy  Patient: Logan Baltimore Note: All result statuses are Final unless otherwise noted.  Tests: (1) Colonoscopy (COL)   COL Colonoscopy           DONE     [facility]     [address]     [place], [place]  [address]           COLONOSCOPY PROCEDURE REPORT           PATIENT:  Craigory, Toste  MR#:  [id number]     BIRTHDATE:  Nov 28, 1940, 69 yrs. old  GENDER:  male     ENDOSCOPIST:  Vania Rea. Jarold Motto, MD, [facility]     REF. BY:     PROCEDURE DATE:  06/18/2010     PROCEDURE:  Colonoscopy with snare polypectomy     ASA CLASS:  Class II     INDICATIONS:  history of pre-cancerous (adenomatous) colon polyps,     Elevated Risk Screening     MEDICATIONS:   Fentanyl 50 mcg IV, Versed 8 mg IV           DESCRIPTION OF PROCEDURE:   After the risks benefits and     alternatives of the procedure were thoroughly explained, informed     consent was obtained.  Digital rectal exam was performed and     revealed no abnormalities.   The LB 180AL [id number] endoscope was     introduced through the anus and advanced to the cecum, which was     identified by both the appendix and ileocecal valve, limited by     poor preparation, a redundant colon.    The quality of the prep     was adequate, using MoviPrep.  The instrument was then slowly     withdrawn as the colon was fully examined.     <<PROCEDUREIMAGES>>           FINDINGS:  ENDOSCOPIC FINDINGS:   A sessile polyp was found in the     cecum. .5 CM FLAT POLYP PIECEMEAL HOT SNARE EXCISED.CECAL BASE.     Severe diverticulosis was found in the sigmoid to descending colon     segments.   Retroflexed views in the rectum revealed no     abnormalities.    The scope was then withdrawn from the patient     and the procedure completed.           COMPLICATIONS:  None     ENDOSCOPIC IMPRESSION:     1) Severe diverticulosis in the sigmoid to descending colon     segments     2) Sessile polyp in the cecum     R/O ADENOMA.  RECOMMENDATIONS:     1) high fiber diet     2) Await biopsy results     3) Repeat Colonoscopy in 3 years.     REPEAT EXAM:  No           ______________________________     Vania Rea. Jarold Motto, MD, [facility]           CC:  Lindley Magnus, MD           n.     Rosalie DoctorVania Rea. Patterson at 06/18/2010 03:43 PM           Alleen Borne, [id number]  Note: An exclamation mark (!) indicates a result that was not dispersed into the flowsheet. Document Creation Date: 06/18/2010 3:43 PM _______________________________________________________________________  (1) Order result status: Final Collection  or observation date-time: 06/18/2010 15:36 Requested date-time:  Receipt date-time:  Reported date-time:  Referring Physician:   Ordering Physician: Sheryn Bison [phone]) Specimen Source:  Source: Launa Grill Order Number: [id number] Lab site:   Appended Document: Colonoscopy

## 2010-07-29 NOTE — Letter (Signed)
Summary: Patient Notice- Polyp Results  [facility]  [address] [place], [place] [address]   Phone: [phone]  Fax: [phone]        June 30, 2010 MRN: [id number]    [facility] [address] [place], [place]  [address]    Dear Mr. OFFORD,  I am pleased to inform you that the colon polyp(s) removed during your recent colonoscopy was (were) found to be benign (no cancer detected) upon pathologic examination.  I recommend you have a repeat colonoscopy examination in 3_ years to look for recurrent polyps, as having colon polyps increases your risk for having recurrent polyps or even colon cancer in the future.  Should you develop new or worsening symptoms of abdominal pain, bowel habit changes or bleeding from the rectum or bowels, please schedule an evaluation with either your primary care physician or with me.  Additional information/recommendations:  _x_ No further action with gastroenterology is needed at this time. Please      follow-up with your primary care physician for your other healthcare      needs.  __ Please call [phone] to schedule a return visit to review your      situation.  __ Please keep your follow-up visit as already scheduled.  __ Continue treatment plan as outlined the day of your exam.  Please call us if you are having persistent problems or have questions about your condition that have not been fully answered at this time.  Sincerely,  Mardella Layman MD [facility]  This letter has been electronically signed by your physician.  Appended Document: Patient Notice- Polyp Results Letter mailed

## 2010-11-09 NOTE — Op Note (Signed)
Juan Lin, Juan Lin              ACCOUNT NO.:  [id number]   MEDICAL RECORD NO.:  [id number]          PATIENT TYPE:  AMB   LOCATION:  SDS                          FACILITY:  [facility]   PHYSICIAN:  Lennie Muckle, MD      DATE OF BIRTH:  1940/09/16   DATE OF PROCEDURE:  06/05/2007  DATE OF DISCHARGE:                               OPERATIVE REPORT   PREOP DIAGNOSIS:  Gallstone pancreatitis with cholelithiasis.   POSTOP DIAGNOSIS:  Gallstone pancreatitis with cholelithiasis.   PROCEDURE:  Laparoscopic cholecystectomy with intraoperative  cholangiogram.   SURGEON:  Bertram Savin, MD.   No assistant.  General endotracheal anesthesia.   SPECIMEN:  Gallbladder.   No drains were placed.  No immediate complications.   INDICATIONS FOR PROCEDURE:  Juan Lin is a 70 year old male who had  an episode of epigastric pain.  During his workup was found to have  gallstone pancreatitis.  He did have slow return to normal of his liver  enzymes.  However, I discussed with Juan Lin and his wife that I  felt he should have an intraoperative cholangiogram to ensure patency of  the common bile duct.  Informed consent was obtained prior to the  procedure.   DETAILS OF PROCEDURE:  Juan Lin was identified in the preoperative  holding suite.  He received IV cefazolin prior to being taken to the  operating room.  Once in the operating room he was placed in the supine  position.  After administration of general endotracheal anesthesia, his  abdomen was prepped and draped in the usual sterile fashion.  Using a  #11 blade, a supraumbilical incision was placed.  A Veress needle was  placed into the abdominal cavity to obtain pneumoperitoneum.  Using the  #11 mm Optiview, the camera was placed into the abdominal cavity.  There  was no evidence of injury with entry into the abdominal cavity, the  Veress needle or the trocar.  There were adhesions noted just superiorly  to the trocar at the  supraumbilical incision.  I placed a 5 mm trocar at  the epigastric region.  An additional 5 mm trocar was placed in the  right upper quadrant.  Using laparoscopic scissors, the adhesions were  taken down sharp dissection.  This enabled a direct visualization of the  gallbladder and the gallbladder fossa beneath the liver.  An additional  5 mm port was placed in the right side of the abdomen.  The gallbladder  was noted to be very contracted.  The fundus was grasped and lifted up.  The area around the infundibulum was attempted to be grasped away from  the liver bed.  However, due to the wall thickening the dissection was  rather difficult.  I was able to carefully dissect the peritoneum  surrounding the cystic duct and the cystic artery which was coursing  medial posterior to the cystic duct.  I did obtain a critical view of  the cystic duct, cystic artery and the liver bed.  The 5 mm epigastric  port had to be up-sized to an 11 mm trocar due to the  size of the cystic  duct.  The clip was closed proximally and the cystic duct was partially  transected.  A Cook catheter was placed into the abdominal cavity for  the cholangiogram.  Once secured in place, a cholangiogram was  performed.  There appeared to be patency in the common duct and there  appeared to be a rather long tortuous cystic duct and both branches of  the common duct into the right and left ducts were identified.  I then  placed 3 clips proximally on the cystic duct and completed the  transection with laparoscopic scissors.  I placed 2 clips on the cystic  artery proximally, 1 distally and also transected this.  There was a  posterior branch of the cystic artery which I followed along the  peritoneum of the gallbladder.  I performed a high ligation of this  posterior branch near the top of the gallbladder near the fundus of the  gallbladder.  The gallbladder fossa was irrigated, there was no evidence  of bleeding at the end of  the case.  The gallbladder was placed in an  EndoCatch bag and removed from the abdomen.  Both 11 mm port sites,  fascia was closed with #0 Vicryl suture.  Skin was closed with #4-0  Monocryl.  Steri-Strips were placed final dressing.  The patient was  then extubated and transported to the Post Anesthesia Care Unit in  stable condition.      Lennie Muckle, MD  Electronically Signed     ALA/MEDQ  D:  06/05/2007  T:  06/05/2007  Job:  [id number]

## 2010-11-12 NOTE — Op Note (Signed)
NAME:  Juan Lin, Juan Lin NO.:  [id number]   MEDICAL RECORD NO.:  [id number]                   PATIENT TYPE:   LOCATION:                                       FACILITY:   PHYSICIAN:  Casimiro Needle B. Sherene Sires, M.D. [facility]           DATE OF BIRTH:   DATE OF PROCEDURE:  04/23/2002  DATE OF DISCHARGE:                                 OPERATIVE REPORT   PROCEDURE:  Fiberoptic bronchoscopy diagnostic with lavage.   HISTORY:  This is a 70 year old white male with persistent cough that has  only responded in the past to high dose prednisone and has failed to respond  to treatment directly at either rhinitis, asthma or topical treatments  designed to treat asthma, rhinitis or reflux.   Bronchoscopy I felt was indicated to perform lavage to be sure he did not  have eosinophilic bronchitis or mechanical cause for cough or atypical  microbacterial infection based on a CT scan showing lingula in the right  middle lobe, predominantly bronchial changes with scarring.   The procedure was performed in the bronchoscopy suite with continuous  monitoring by surface ECG. The patient agreed to the procedure after a full  discussion of risks, benefits, and alternatives. He received a total of 50  mg of IV Demerol and 5 mg of IV Versed with marginal control of airway  coughing. He also received 1% lidocaine aerosol by [employer].   Using a standard fiberoptic bronchoscope, the right naris was easily  cannulated with good visualization of the entire oropharynx and larynx. The  cords moved normally and there were no apparent upper airway lesions. There  was extreme and exquisite sensitivity of the upper airway to lidocaine and  the bronchoscope such that it took an inordinate amount of lidocaine to try  to topically anesthetize the upper airway.   Using an additional 1% lidocaine as needed, the entire tracheobronchial tree  was explored bilaterally with the following findings:  1. The trachea, carina and all the airways were normal except for the     lingula orifice. Here there appeared to be mucoid secretions lining the     airway leading to the lingula. This was therefore lavage selectively with     adequate return and sent for Gram stain and culture, cell count with     differential, AFB and fungal stain and culture as well as cytology.   The patient tolerated the procedure well. Followup will be arranged as an  outpatient.   FINDINGS:  Cough undetermined etiology. Rule out eosinophilic bronchitis and  a MIA infection.                                               Charlaine Dalton. Sherene Sires, M.D. [facility]    MBW/MEDQ  D:  04/23/2002  T:  04/23/2002  Job:  [id number]

## 2011-04-04 LAB — COMPREHENSIVE METABOLIC PANEL
AST: 27
Albumin: 4
Alkaline Phosphatase: 62
Chloride: 104
Creatinine, Ser: 1.04
GFR calc Af Amer: >60
GFR calc non Af Amer: >60

## 2011-04-04 LAB — CBC
HCT: 43.4
Hemoglobin: 15.3
MCHC: 35.4
Platelets: 249

## 2011-05-17 ENCOUNTER — Telehealth: Payer: Self-pay | Admitting: Internal Medicine

## 2011-05-17 NOTE — Telephone Encounter (Signed)
Pt called again for find out status of getting work in ov with any doctor or status of getting an abx called in to [facility] on [address] today. Pls call pt asap.

## 2011-05-17 NOTE — Telephone Encounter (Signed)
Pt called back again and said that he would see any doctor that has an opening or he would like an abx to be called in to [facility] at [address]

## 2011-05-17 NOTE — Telephone Encounter (Signed)
Wants to only see Dr Juan Lin today. He has a sinus infection which is going down into his chest. Please advise and return his call. He refused several times to see another MD. Thanks.

## 2011-05-18 ENCOUNTER — Encounter: Payer: Self-pay | Admitting: Internal Medicine

## 2011-05-18 ENCOUNTER — Ambulatory Visit (INDEPENDENT_AMBULATORY_CARE_PROVIDER_SITE_OTHER): Payer: Medicare Other | Admitting: Internal Medicine

## 2011-05-18 DIAGNOSIS — J309 Allergic rhinitis, unspecified: Secondary | ICD-10-CM

## 2011-05-18 DIAGNOSIS — J069 Acute upper respiratory infection, unspecified: Secondary | ICD-10-CM

## 2011-05-18 MED ORDER — METHYLPREDNISOLONE ACETATE 40 MG/ML IJ SUSP
40.0000 mg | Freq: Once | INTRAMUSCULAR | Status: AC
  Administered 2011-05-18: 40 mg via INTRAMUSCULAR

## 2011-05-18 MED ORDER — FLUTICASONE PROPIONATE 50 MCG/ACT NA SUSP: 1.0000 | Freq: Every day | NASAL | Status: DC

## 2011-05-18 NOTE — Progress Notes (Signed)
  Subjective:    Patient ID: Juan Lin, male    DOB: [birth year], 70 y.o.   MRN: [id number]  HPI  70 year old patient who has a history of allergic rhinitis for the past 7 days he has had increased sinus congestion drainage postnasal drip and minimal cough. There's been no fever although his nasal drainage has become a bit discolored. Denies any fever chills shortness of breath or productive cough he has been treated for sinusitis and bronchitis in the past;  at the present time he is using Mucinex   Review of Systems  Constitutional: Positive for fatigue. Negative for fever, chills and appetite change.  HENT: Positive for congestion, rhinorrhea and postnasal drip. Negative for hearing loss, ear pain, sore throat, trouble swallowing, neck stiffness, dental problem, voice change and tinnitus.   Eyes: Negative for pain, discharge and visual disturbance.  Respiratory: Negative for cough, chest tightness, wheezing and stridor.   Cardiovascular: Negative for chest pain, palpitations and leg swelling.  Gastrointestinal: Negative for nausea, vomiting, abdominal pain, diarrhea, constipation, blood in stool and abdominal distention.  Genitourinary: Negative for urgency, hematuria, flank pain, discharge, difficulty urinating and genital sores.  Musculoskeletal: Negative for myalgias, back pain, joint swelling, arthralgias and gait problem.  Skin: Negative for rash.  Neurological: Negative for dizziness, syncope, speech difficulty, weakness, numbness and headaches.  Hematological: Negative for adenopathy. Does not bruise/bleed easily.  Psychiatric/Behavioral: Negative for behavioral problems and dysphoric mood. The patient is not nervous/anxious.        Objective:   Physical Exam  Constitutional: He is oriented to person, place, and time. He appears well-developed and well-nourished.  HENT:  Head: Normocephalic.  Right Ear: External ear normal.  Left Ear: External ear normal.  Eyes:  Conjunctivae and EOM are normal. Pupils are equal, round, and reactive to light. Right eye exhibits no discharge. Left eye exhibits no discharge.  Neck: Normal range of motion. Neck supple. No thyromegaly present.  Cardiovascular: Normal rate, regular rhythm and normal heart sounds.   Pulmonary/Chest: Effort normal and breath sounds normal. No respiratory distress. He has no wheezes. He has no rales.  Abdominal: Soft. Bowel sounds are normal.  Musculoskeletal: Normal range of motion. He exhibits no edema and no tenderness.  Lymphadenopathy:    He has no cervical adenopathy.  Neurological: He is alert and oriented to person, place, and time.  Psychiatric: He has a normal mood and affect. His behavior is normal.          Assessment & Plan:   Allergic rhinitis. Possible allergy flare versus viral URI. Will treat with expectorants decongestants nasal steroids. Was also given an injection of Depo-Medrol 40 mg IM. We'll also consider a Patent examiner

## 2011-05-18 NOTE — Telephone Encounter (Signed)
Pt has appt with Dr Kirtland Bouchard this morning

## 2011-05-18 NOTE — Patient Instructions (Signed)
Acute sinusitis symptoms for less than 10 days are generally not helped by antibiotic therapy.  Use saline irrigation, warm  moist compresses and over-the-counter decongestants only as directed.  Call if there is no improvement in 5 to 7 days, or sooner if you develop increasing pain, fever, or any new symptoms.  Netti Pot as directed

## 2011-05-18 NOTE — Progress Notes (Signed)
Addended by: Duard Brady I on: 05/18/2011 11:06 AM   Modules accepted: Orders

## 2011-07-07 ENCOUNTER — Encounter: Payer: Self-pay | Admitting: Internal Medicine

## 2011-07-07 ENCOUNTER — Ambulatory Visit (INDEPENDENT_AMBULATORY_CARE_PROVIDER_SITE_OTHER): Payer: Medicare Other | Admitting: Internal Medicine

## 2011-07-07 DIAGNOSIS — E785 Hyperlipidemia, unspecified: Secondary | ICD-10-CM

## 2011-07-07 DIAGNOSIS — C61 Malignant neoplasm of prostate: Secondary | ICD-10-CM

## 2011-07-07 LAB — BASIC METABOLIC PANEL
BUN: 15 mg/dL (ref 6–23)
CO2: 29 mEq/L (ref 19–32)
GFR: 72.04 mL/min (ref >60.00)
Glucose, Bld: 92 mg/dL (ref 70–99)
Potassium: 3.7 mEq/L (ref 3.5–5.1)

## 2011-07-07 LAB — LIPID PANEL
HDL: 56.3 mg/dL (ref >39.00)
LDL Cholesterol: 101 mg/dL — ABNORMAL HIGH (ref 0–99)
Total CHOL/HDL Ratio: 3
Triglycerides: 81 mg/dL (ref 0.0–149.0)
VLDL: 16.2 mg/dL (ref 0.0–40.0)

## 2011-07-07 LAB — CBC WITH DIFFERENTIAL/PLATELET
HCT: 43.3 % (ref 39.0–52.0)
Hemoglobin: 15.1 g/dL (ref 13.0–17.0)
Lymphocytes Relative: 16.9 % (ref 12.0–46.0)
Lymphs Abs: 1.4 10*3/uL (ref 0.7–4.0)
MCHC: 34.9 g/dL (ref 30.0–36.0)
Monocytes Absolute: 1 10*3/uL (ref 0.1–1.0)
Neutro Abs: 5.7 10*3/uL (ref 1.4–7.7)
Platelets: 197 10*3/uL (ref 150.0–400.0)
RBC: 4.67 Mil/uL (ref 4.22–5.81)
RDW: 13.1 % (ref 11.5–14.6)
WBC: 8.3 10*3/uL (ref 4.5–10.5)

## 2011-07-07 LAB — POCT URINALYSIS DIPSTICK
Leukocytes, UA: NEGATIVE
Urobilinogen, UA: 0.2

## 2011-07-07 LAB — HEPATIC FUNCTION PANEL
Albumin: 4.1 g/dL (ref 3.5–5.2)
Alkaline Phosphatase: 55 U/L (ref 39–117)
Total Bilirubin: 2.1 mg/dL — ABNORMAL HIGH (ref 0.3–1.2)
Total Protein: 6.9 g/dL (ref 6.0–8.3)

## 2011-07-07 LAB — PSA: PSA: 3.05 ng/mL (ref 0.10–4.00)

## 2011-07-07 LAB — TSH: TSH: 6.85 u[IU]/mL — ABNORMAL HIGH (ref 0.35–5.50)

## 2011-07-07 MED ORDER — TRAMADOL HCL 50 MG PO TABS: 50.0000 mg | ORAL_TABLET | Freq: Every evening | ORAL | Status: AC | PRN

## 2011-07-07 NOTE — Progress Notes (Signed)
Patient ID: Juan Lin, male   DOB: 1940/12/29, 71 y.o.   MRN: [id number] cpx  Complains of URI sxs--since Monday. No fever or chills but has some sinus congestion and cough. Cough can be productive of white sputum.  No SOB.  Cough can be disruptive  Past Medical History  Diagnosis Date  . Prostate cancer   . Diverticulosis   . Sinusitis acute   . Seasonal allergies   . Adenoma 2008    serrated adenoma of colon  . Hyperlipidemia     History   Social History  . Marital Status: Married    Spouse Name: N/A    Number of Children: N/A  . Years of Education: N/A   Occupational History  . Not on file.   Social History Main Topics  . Smoking status: Never Smoker   . Smokeless tobacco: Never Used  . Alcohol Use: Yes  . Drug Use: Not on file  . Sexually Active: Not on file   Other Topics Concern  . Not on file   Social History Narrative  . No narrative on file    Past Surgical History  Procedure Date  . Prostatectomy 1994    No family history on file.  No Known Allergies  Current Outpatient Prescriptions on File Prior to Visit  Medication Sig Dispense Refill  . fluticasone (FLONASE) 50 MCG/ACT nasal spray Place 1 spray into the nose daily.  16 g  2  . glucosamine-chondroitin 500-400 MG tablet Take 1 tablet by mouth daily.        [name] loratadine (CLARITIN) 10 MG tablet Take 10 mg by mouth daily.        . Multiple Vitamins-Minerals (MULTIVITAMIN WITH MINERALS) tablet Take 1 tablet by mouth daily.        . Omega-3 Fatty Acids (FISH OIL) 500 MG CAPS Take 1 capsule by mouth daily.           patient denies chest pain, shortness of breath, orthopnea. Denies lower extremity edema, abdominal pain, change in appetite, change in bowel movements. Patient denies rashes, musculoskeletal complaints. No other specific complaints in a complete review of systems.   There were no vitals taken for this visit. Well-developed male in no acute distress. HEENT exam atraumatic,  normocephalic, extraocular muscles are intact. Conjunctivae are pink without exudate. Neck is supple without lymphadenopathy, thyromegaly, jugular venous distention. Chest is clear to auscultation without increased work of breathing. Cardiac exam S1-S2 are regular. The PMI is normal. No significant murmurs or gallops. Abdominal exam active bowel sounds, soft, nontender. No abdominal bruits. Extremities no clubbing cyanosis or edema. Peripheral pulses are normal without bruits. Neurologic exam alert and oriented without any motor or sensory deficits.

## 2011-07-12 ENCOUNTER — Other Ambulatory Visit: Payer: Self-pay | Admitting: Internal Medicine

## 2011-07-12 DIAGNOSIS — R972 Elevated prostate specific antigen [PSA]: Secondary | ICD-10-CM

## 2011-08-12 DIAGNOSIS — R972 Elevated prostate specific antigen [PSA]: Secondary | ICD-10-CM | POA: Diagnosis not present

## 2011-08-12 DIAGNOSIS — C61 Malignant neoplasm of prostate: Secondary | ICD-10-CM | POA: Diagnosis not present

## 2011-08-26 ENCOUNTER — Encounter: Payer: Self-pay | Admitting: *Deleted

## 2011-08-26 DIAGNOSIS — C61 Malignant neoplasm of prostate: Secondary | ICD-10-CM | POA: Insufficient documentation

## 2011-08-26 NOTE — Progress Notes (Signed)
HX  Radical Prostatectomy ,Path: Adenocarcinoma,.Gleason was 2+3=5,PSA=4.5;,Volume=29.0 grams; 09/14/1992 Dr. [place] at [facility]  PSA  07/07/11=3.05 2.43 10/14/09 2.39,03/10/09 1.46 05/16/07 1.65 12/07/06  Married,no children,  No c/o pain or discomfort, alert and oriented x 3, steaday gait  Allergies, NKDA, seasonal  08/29/11, I-PPS= [id number] score=9 IIEF=53 [id number]-, [id number]   Paternal uncle and cousin both deceased, had prostate cancer

## 2011-08-29 ENCOUNTER — Ambulatory Visit
Admission: RE | Admit: 2011-08-29 | Discharge: 2011-08-29 | Disposition: A | Discharge status: Home / Self Care | Payer: Medicare Other | Source: Ambulatory Visit | Attending: Radiation Oncology | Admitting: Radiation Oncology

## 2011-08-29 ENCOUNTER — Encounter: Payer: Self-pay | Admitting: Radiation Oncology

## 2011-08-29 VITALS — BP 129/87 | HR 76 | Temp 97.1°F | Resp 20 | Ht 73.0 in | Wt 161.8 lb

## 2011-08-29 DIAGNOSIS — K219 Gastro-esophageal reflux disease without esophagitis: Secondary | ICD-10-CM | POA: Insufficient documentation

## 2011-08-29 DIAGNOSIS — C61 Malignant neoplasm of prostate: Secondary | ICD-10-CM

## 2011-08-29 DIAGNOSIS — Z79899 Other long term (current) drug therapy: Secondary | ICD-10-CM | POA: Insufficient documentation

## 2011-08-29 DIAGNOSIS — R972 Elevated prostate specific antigen [PSA]: Secondary | ICD-10-CM | POA: Diagnosis not present

## 2011-08-29 DIAGNOSIS — Z9079 Acquired absence of other genital organ(s): Secondary | ICD-10-CM | POA: Diagnosis not present

## 2011-08-29 DIAGNOSIS — E785 Hyperlipidemia, unspecified: Secondary | ICD-10-CM | POA: Diagnosis not present

## 2011-08-29 DIAGNOSIS — Z8042 Family history of malignant neoplasm of prostate: Secondary | ICD-10-CM | POA: Diagnosis not present

## 2011-08-29 NOTE — Progress Notes (Signed)
Please see the Nurse Progress Note in the MD Initial Consult Encounter for this patient. 

## 2011-08-31 ENCOUNTER — Encounter: Payer: Self-pay | Admitting: Radiation Oncology

## 2011-08-31 NOTE — Progress Notes (Signed)
Radiation Oncology         [phone] ________________________________  Initial outpatient Consultation  Name: Juan Lin MRN: [id number]  Date: 08/31/2011  DOB: 1940-11-23  WU:[id number],YNWGN Juan Cooter, MD, MD  [name],*   REFERRING PHYSICIAN: [name],*  DIAGNOSIS: 71 year old gentleman with a history of prostate cancer status post radical prostatectomy in 1994 with rising PSA most recently 3.05  HISTORY OF PRESENT ILLNESS::Juan Lin is a 71 y.o. male who is a very interesting gentleman who was diagnosed with adenocarcinoma of the prostate with PSA level IV.5 in 1994 the patient proceeded to radical prostatectomy at [facility] on March 21 of 1994. The prostate measured 29 g at that time and contained 10% involvement with adenocarcinoma characterized as Gleason's 2+3. Surgical margins were negative. There was no penetration of the capsule. There was no perineural or lymphovascular invasion noted. Pelvic lymph nodes were uninvolved. Seminal vesicles were patient experienced an excellent biochemical control with undetectable PSA until 1997 when it was found to be 0.1. In 1998, and returned to an undetectable level and in 1999 increased to 0.2. It has gradually increased over subsequent years most recently achieving a level of 3.05 on January 10 of 2013. His PSA doubling time appears to be 4 years. Patient has been followed by Dr. Cato Mulligan and Dr. Margarita Grizzle. He is currently been referred today for a discussion about salvage radiotherapy.[name]  PREVIOUS RADIATION THERAPY: No  PAST MEDICAL HISTORY:  has a past medical history of Diverticulosis; Sinusitis acute; Seasonal allergies; Adenoma (2008); Hyperlipidemia; Prostate cancer (1994); and GERD (gastroesophageal reflux disease).    PAST SURGICAL HISTORY: Past Surgical History  Procedure Date  . Prostatectomy 1994    DR[facility]  . Appendectomy   . Tonsillectomy   . Cholecystectomy 05/2009    FAMILY  HISTORY: family history includes Prostate cancer in his cousin and paternal uncle.  SOCIAL HISTORY:  reports that he has never smoked. He has never used smokeless tobacco. He reports that he drinks alcohol.  ALLERGIES: Review of patient's allergies indicates no known allergies.  MEDICATIONS:  Current Outpatient Prescriptions  Medication Sig Dispense Refill  . Calcium Carbonate-Vitamin D 300-100 MG-UNIT CAPS Take 1 capsule by mouth daily.      [name] glucosamine-chondroitin 500-400 MG tablet Take 1 tablet by mouth daily.        [name] guaiFENesin (MUCINEX) 600 MG 12 hr tablet Take 1,200 mg by mouth 2 (two) times daily.      [name] loratadine (CLARITIN) 10 MG tablet Take 10 mg by mouth daily.        . Multiple Vitamins-Minerals (MULTIVITAMIN WITH MINERALS) tablet Take 1 tablet by mouth daily.        . Omega-3 Fatty Acids (FISH OIL) 500 MG CAPS Take 1 capsule by mouth daily.        . vitamin C (ASCORBIC ACID) 500 MG tablet Take 500 mg by mouth daily.      . fluticasone (FLONASE) 50 MCG/ACT nasal spray Place 1 spray into the nose daily.  16 g  2    REVIEW OF SYSTEMS:  A 15 point review of systems is documented in the electronic medical record. This was obtained by the nursing staff. However, I reviewed this with the patient to discuss relevant findings and make appropriate changes.  A comprehensive review of systems was negative.   PHYSICAL EXAM:  height is 6\' 1"  (1.854 m) and weight is 161 lb 12.8 oz (73.392 kg). His oral temperature is 97.1  F (36.2 C). His blood pressure is 129/87 and his pulse is 76. His respiration is 20.   The patient is in no acute distress today. He is alert and oriented. He is a cheerful. I did not perform a formal physical exam today. Dr. Hilario Quarry note describes an absent prostate fossa did a rectal exam.  LABORATORY DATA:  Lab Results  Component Value Date   WBC 8.3 07/07/2011   HGB 15.1 07/07/2011   HCT 43.3 07/07/2011   MCV 92.6 07/07/2011   PLT 197.0 07/07/2011   Lab  Results  Component Value Date   NA 131* 07/07/2011   K 3.7 07/07/2011   CL 95* 07/07/2011   CO2 29 07/07/2011   Lab Results  Component Value Date   ALT 19 07/07/2011   AST 24 07/07/2011   ALKPHOS 55 07/07/2011   BILITOT 2.1* 07/07/2011     RADIOGRAPHY: No results found.    IMPRESSION: Mr. [name] is a very interesting 71 year old gentleman with rising PSA following radical prostatectomy. He seems to have very low-grade disease based on the pathology report with a Gleason score 2+3. He also had no adverse pathologic features which might portend a local recurrence. The slow rise in his PSA over the past decade would be suggestive of low-grade disease such as Gleason's 5. However, the source of the rising PSA is not entirely clear. He could have recurrent disease within the prostatic fossa or elsewhere. The rate of PSA rise and the grade of his disease may be slightly more suggestive of local recurrence amenable to salvage radiation treatment. His PSA is high enough that he may in fact have a solitary gross deposit of disease visible on imaging studies. For that reason, I suspect restaging imaging would be appropriate prior to making any decisions about possible treatment.  PLAN: Today, I talked to the patient and his wife about his situation. Talked about the management of prostate cancer in general. The role of radiation treatment following prostatectomy. Talked about adjuvant radiation versus salvage radiation. Talked about some of the risk factors which can predict for local recurrence and I explained that he does not seem to have any of these risk factors on his pathology report. We discussed the logistics and delivery of radiation treatment as well as the anticipated acute and late sequela. The patient had several questions and seems very well-informed regarding prostate cancer. At this point, he does not wish to pursue active therapy such as radiation but he will would like to consider pursuing  radiation along the way if his PSA continues to rise. I will talk with Dr. Margarita Grizzle about whether followup restaging imaging would be appropriate. The patient may be suitable for standard bone scan and CT of the abdomen and pelvis or possibly a sodium fluoride PET scan. If any of these studies documented oligo metastatic disease, the patient could be treated aggressively to a metastatic deposit. I talked to stereotactic body irradiation for oligo metastatic disease in addition or as an alternative to prostatic fossa radiation if his imaging suggests metastases. Patient plans to followup with Dr. Margarita Grizzle. I enjoyed meeting with him today and will be happy to be involved in his care if clinically indicated.  I spent 60 minutes minutes face to face with the patient and more than 50% of that time was spent in counseling and/or coordination of care.   ------------------------------------------------  Artist Pais. Kathrynn Running, M.D.

## 2011-12-06 ENCOUNTER — Ambulatory Visit (INDEPENDENT_AMBULATORY_CARE_PROVIDER_SITE_OTHER): Payer: Medicare Other | Admitting: Family Medicine

## 2011-12-06 ENCOUNTER — Encounter: Payer: Self-pay | Admitting: Family Medicine

## 2011-12-06 VITALS — BP 120/82 | HR 90 | Temp 99.6°F | Wt 160.0 lb

## 2011-12-06 DIAGNOSIS — J40 Bronchitis, not specified as acute or chronic: Secondary | ICD-10-CM

## 2011-12-06 MED ORDER — HYDROCODONE-HOMATROPINE 5-1.5 MG/5ML PO SYRP: 5.0000 mL | ORAL_SOLUTION | ORAL | Status: AC | PRN

## 2011-12-06 MED ORDER — AZITHROMYCIN 250 MG PO TABS: ORAL_TABLET | ORAL | Status: AC

## 2011-12-06 NOTE — Progress Notes (Signed)
  Subjective:    Patient ID: Juan Lin, male    DOB: May 12, 1941, 71 y.o.   MRN: [id number]  HPI Here for 5 days of sinus pressure, PND, ST, chest congestion, and coughing up green sputum.    Review of Systems  Constitutional: Positive for fever.  HENT: Positive for congestion, postnasal drip and sinus pressure.   Eyes: Negative.   Respiratory: Positive for cough.        Objective:   Physical Exam  Constitutional: He appears well-developed and well-nourished.  HENT:  Right Ear: External ear normal.  Left Ear: External ear normal.  Nose: Nose normal.  Mouth/Throat: Oropharynx is clear and moist. No oropharyngeal exudate.  Eyes: Conjunctivae are normal.  Pulmonary/Chest: Effort normal. No respiratory distress. He has no wheezes. He has no rales.       Scattered rhonchi  Lymphadenopathy:    He has no cervical adenopathy.          Assessment & Plan:  Add Mucinex

## 2011-12-26 ENCOUNTER — Encounter: Payer: Self-pay | Admitting: Family Medicine

## 2011-12-26 ENCOUNTER — Ambulatory Visit (INDEPENDENT_AMBULATORY_CARE_PROVIDER_SITE_OTHER): Payer: Medicare Other | Admitting: Family Medicine

## 2011-12-26 VITALS — BP 120/72 | Temp 98.1°F | Wt 162.0 lb

## 2011-12-26 DIAGNOSIS — J019 Acute sinusitis, unspecified: Secondary | ICD-10-CM | POA: Diagnosis not present

## 2011-12-26 MED ORDER — LEVOFLOXACIN 500 MG PO TABS: 500.0000 mg | ORAL_TABLET | Freq: Every day | ORAL | Status: AC

## 2011-12-26 NOTE — Progress Notes (Signed)
  Subjective:    Patient ID: Juan Lin, male    DOB: February 27, 1941, 71 y.o.   MRN: [id number]  HPI  Acute visit- patient seen with persistent sinus symptoms. Was seen recently and treated with Zithromax. He had progressive left maxillary facial pain and some frontal discomfort as well. Persistent nasal congestion not relieved Mucinex. Intermittent headaches. No nausea or vomiting. He has occasional cough. In addition Mucinex he start Claritin. He has occasional tan to brownish colored mucus.  Past Medical History  Diagnosis Date  . Diverticulosis   . Sinusitis acute   . Seasonal allergies   . Adenoma 2008    serrated adenoma of colon  . Hyperlipidemia   . Prostate cancer 1994    radical prostatectomy Dr. Richardo Priest  . GERD (gastroesophageal reflux disease)    Past Surgical History  Procedure Date  . Prostatectomy 1994    [facility]  . Appendectomy   . Tonsillectomy   . Cholecystectomy 05/2009    reports that he has never smoked. He has never used smokeless tobacco. He reports that he drinks about 3.5 ounces of alcohol per week. He reports that he does not use illicit drugs. family history includes Prostate cancer in his cousin and paternal uncle. No Known Allergies    Review of Systems  Constitutional: Positive for fatigue. Negative for fever and chills.  HENT: Positive for congestion and sinus pressure.   Respiratory: Positive for cough. Negative for shortness of breath.   Neurological: Positive for headaches.       Objective:   Physical Exam  Constitutional: He appears well-developed and well-nourished.  HENT:  Right Ear: External ear normal.  Left Ear: External ear normal.  Mouth/Throat: Oropharynx is clear and moist.  Neck: Neck supple.  Cardiovascular: Normal rate and regular rhythm.   Pulmonary/Chest: Effort normal and breath sounds normal. No respiratory distress. He has no wheezes. He has no rales.  Lymphadenopathy:    He has no cervical adenopathy.            Assessment & Plan:  Probable acute sinusitis. Unimproved with Zithromax. Levaquin 500 milligrams once daily for 10 days. Consider CT maxillofacial in 2 weeks if no better

## 2011-12-26 NOTE — Patient Instructions (Addendum)

## 2012-02-28 DIAGNOSIS — C61 Malignant neoplasm of prostate: Secondary | ICD-10-CM | POA: Diagnosis not present

## 2012-03-01 DIAGNOSIS — C61 Malignant neoplasm of prostate: Secondary | ICD-10-CM | POA: Diagnosis not present

## 2012-03-19 ENCOUNTER — Encounter: Payer: Self-pay | Admitting: *Deleted

## 2012-06-21 ENCOUNTER — Other Ambulatory Visit (HOSPITAL_COMMUNITY): Payer: Self-pay | Admitting: Urology

## 2012-06-21 DIAGNOSIS — C61 Malignant neoplasm of prostate: Secondary | ICD-10-CM

## 2012-07-09 ENCOUNTER — Encounter (HOSPITAL_COMMUNITY)
Admission: RE | Admit: 2012-07-09 | Discharge: 2012-07-09 | Disposition: A | Discharge status: Home / Self Care | Payer: Medicare Other | Source: Ambulatory Visit | Attending: Urology | Admitting: Urology

## 2012-07-09 DIAGNOSIS — C61 Malignant neoplasm of prostate: Secondary | ICD-10-CM | POA: Diagnosis not present

## 2012-07-09 DIAGNOSIS — Z006 Encounter for examination for normal comparison and control in clinical research program: Secondary | ICD-10-CM | POA: Diagnosis not present

## 2012-07-09 MED ORDER — FLUDEOXYGLUCOSE F - 18 (FDG) INJECTION
10.0000 | Freq: Once | INTRAVENOUS | Status: AC | PRN
  Administered 2012-07-09: 10 via INTRAVENOUS

## 2012-12-03 ENCOUNTER — Telehealth: Payer: Self-pay | Admitting: Internal Medicine

## 2012-12-03 NOTE — Telephone Encounter (Signed)
Patient Information:  Caller Name: Koltan  Phone: [phone]  Patient: Juan Lin, Diskin  Gender: Male  DOB: 03/03/1941  Age: 72 Years  PCP: Birdie Sons (Adults only)  Office Follow Up:  Does the office need to follow up with this patient?: No  Instructions For The Office: N/A   Symptoms  Reason For Call & Symptoms: Onset 12/01/2012 tick bite on chin in beard, area is sore, and doesn't feel well today.  Patient states [place] tick.  Has raised red bump at location of bite.  Reviewed Health History In EMR: Yes  Reviewed Medications In EMR: Yes  Reviewed Allergies In EMR: Yes  Reviewed Surgeries / Procedures: Yes  Date of Onset of Symptoms: 12/01/2012  Guideline(s) Used:  Tick Bite  Disposition Per Guideline:   Home Care  Reason For Disposition Reached:   Tick bite with no complications  Advice Given:  Antibiotic Ointment:  Wash the wound and your hands with soap and water after removal to prevent catching any tick disease. Apply an over-the-counter antibiotic ointment (e.g., bacitracin) to the bite once.  Expected Course:  Tick bites normally do not itch or hurt. That is why they often go unnoticed.  Call Back If:  Fever or rash occur in the next 2 weeks  Bite begins to look infected  You become worse.  Patient Will Follow Care Advice:  YES

## 2012-12-04 ENCOUNTER — Ambulatory Visit (INDEPENDENT_AMBULATORY_CARE_PROVIDER_SITE_OTHER): Payer: Medicare Other | Admitting: Family Medicine

## 2012-12-04 ENCOUNTER — Encounter: Payer: Self-pay | Admitting: Family Medicine

## 2012-12-04 VITALS — BP 110/70 | Temp 98.4°F

## 2012-12-04 DIAGNOSIS — T148 Other injury of unspecified body region: Secondary | ICD-10-CM

## 2012-12-04 DIAGNOSIS — W57XXXA Bitten or stung by nonvenomous insect and other nonvenomous arthropods, initial encounter: Secondary | ICD-10-CM

## 2012-12-04 NOTE — Patient Instructions (Addendum)
Continue with Claritan one daily Avoid further use of Neosporin Consider topical hydrocortisone cream

## 2012-12-04 NOTE — Progress Notes (Signed)
  Subjective:    Patient ID: Juan Lin, male    DOB: Feb 11, 1941, 72 y.o.   MRN: [id number]  HPI Acute visit Tick bite this past Saturday, 3 days ago. Location was chin and he described this as a [place] tick He's had some local itching. Tic was removed in entirety. No fever. No headaches. No local or generalized rash. No arthralgias. He has not used anything topically.   Review of Systems  Constitutional: Negative for fever, chills and appetite change.  Musculoskeletal: Negative for arthralgias.  Skin: Negative for rash.  Neurological: Negative for headaches.       Objective:   Physical Exam  Constitutional: He appears well-developed and well-nourished.  Cardiovascular: Normal rate and regular rhythm.   Pulmonary/Chest: Effort normal and breath sounds normal. No respiratory distress. He has no wheezes. He has no rales.  Skin:  Patient has very small eschar chin region. Minimal surrounding erythema which is non-warm to palpation and nontender. No pustules. No surrounding rashes. No generalized rashes          Assessment & Plan:  Tick bite. Suspect local allergic reaction. We discussed tick associated fever and especially described STARI, though he does not have any rash or other findings to suggest. Avoid further Neosporin. Continue Claritin. Consider topical steroid cream such as hydrocortisone. We did not see any evidence for retained tick parts

## 2012-12-18 ENCOUNTER — Telehealth: Payer: Self-pay | Admitting: Internal Medicine

## 2012-12-18 NOTE — Telephone Encounter (Signed)
[facility] Triage Call Report Triage Record Num: [id number] Operator: Griselda Miner Patient Name: Juan Lin Call Date & Time: 12/17/2012 6:40:06PM Patient Phone: [phone] PCP: Valetta Mole. Swords Patient Gender: Male PCP Fax : [phone] Patient DOB: Feb 20, 1941 Practice Name: [facility] Reason for Call: Caller: [name]/Patient; PCP: Birdie Sons (Adults only); CB#: [phone]; Call regarding Recent tick bit (06/07)e-now has diarrhea (12/14/12);Watery stools- the morning every other day since 06/20; today stool was formed and tan. Today is feeling uneasy stomach, tired and just feeling bad. Is eating some but has no appetite. Is drinking fluids and voiding normally.Using Immodium for diarrhea [place] tick was seen and patient has been seen by provider. Has not had rash, fever. Has had slight headache and joint pain. Lymph glands in neck may be tender but not bad per patient. Triaged using Bites, stings, insects and spiders as well as diarrhea or other change in bowel habits with dispositions of home care due all other situations and see in 2 wks due to change in character of bowel movements. Care advice given with caller demonstrating his understanding. Declined appointment will call back as needed. Protocol(s) Used: Bites and Stings - Insects or Spiders Recommended Outcome per Protocol: Provide Home/Self Care Reason for Outcome: All other situations Care Advice: If you find a tick on yourself, it is important to take it off as soon as possible. The tick transmitting [facility] Spotted Fever can cause an infection after several hours of attachment. The risk of getting Lyme disease is much lower if the tick is removed within 36 hours. ~ Call EMS 911 if develop change in level of consciousness, paralysis, difficulty breathing, chest pain or palpitations, or severe signs dehydration. ~ 06/

## 2012-12-18 NOTE — Telephone Encounter (Signed)
[facility] Triage Call Report Triage Record Num: [id number] Operator: Rebeca Allegra Patient Name: Juan Lin Call Date & Time: 12/17/2012 8:00:04PM Patient Phone: [phone] PCP: Patient Gender: Male PCP Fax : Patient DOB: 12/11/1940 Practice Name: [facility] Reason for Call: Caller: Keddrick/Patient; PCP: Birdie Sons (Adults only); CB#: [phone]; Call regarding Tick Bite; No answer upon call back. VM left for pt to call back if still needed. Protocol(s) Used: Office Note Recommended Outcome per Protocol: Information Noted and Sent to Office Reason for Outcome: Caller information to office Care Advice: ~ 06/

## 2013-02-20 DIAGNOSIS — C44611 Basal cell carcinoma of skin of unspecified upper limb, including shoulder: Secondary | ICD-10-CM | POA: Diagnosis not present

## 2013-02-20 DIAGNOSIS — C44711 Basal cell carcinoma of skin of unspecified lower limb, including hip: Secondary | ICD-10-CM | POA: Diagnosis not present

## 2013-02-20 DIAGNOSIS — C44519 Basal cell carcinoma of skin of other part of trunk: Secondary | ICD-10-CM | POA: Diagnosis not present

## 2013-04-08 ENCOUNTER — Encounter: Payer: Self-pay | Admitting: Internal Medicine

## 2013-04-08 ENCOUNTER — Ambulatory Visit (INDEPENDENT_AMBULATORY_CARE_PROVIDER_SITE_OTHER): Payer: Medicare Other | Admitting: Internal Medicine

## 2013-04-08 VITALS — BP 112/80 | HR 76 | Temp 98.0°F | Ht 73.0 in | Wt 158.0 lb

## 2013-04-08 DIAGNOSIS — E785 Hyperlipidemia, unspecified: Secondary | ICD-10-CM

## 2013-04-08 DIAGNOSIS — K219 Gastro-esophageal reflux disease without esophagitis: Secondary | ICD-10-CM | POA: Diagnosis not present

## 2013-04-08 DIAGNOSIS — C4491 Basal cell carcinoma of skin, unspecified: Secondary | ICD-10-CM | POA: Diagnosis not present

## 2013-04-08 DIAGNOSIS — C61 Malignant neoplasm of prostate: Secondary | ICD-10-CM

## 2013-04-08 LAB — HEPATIC FUNCTION PANEL
ALT: 16 U/L (ref 0–53)
Total Protein: 6.9 g/dL (ref 6.0–8.3)

## 2013-04-08 LAB — CBC WITH DIFFERENTIAL/PLATELET
Basophils Relative: 0.5 % (ref 0.0–3.0)
Eosinophils Absolute: 0.1 10*3/uL (ref 0.0–0.7)
Lymphocytes Relative: 35.4 % (ref 12.0–46.0)
MCHC: 33.9 g/dL (ref 30.0–36.0)
Neutrophils Relative %: 52.1 % (ref 43.0–77.0)
Platelets: 224 10*3/uL (ref 150.0–400.0)
RBC: 4.83 Mil/uL (ref 4.22–5.81)
WBC: 5.6 10*3/uL (ref 4.5–10.5)

## 2013-04-08 LAB — BASIC METABOLIC PANEL
BUN: 10 mg/dL (ref 6–23)
CO2: 28 mEq/L (ref 19–32)
Chloride: 100 mEq/L (ref 96–112)
Creatinine, Ser: 1 mg/dL (ref 0.4–1.5)

## 2013-04-08 LAB — PSA: PSA: 5.18 ng/mL — ABNORMAL HIGH (ref 0.10–4.00)

## 2013-04-08 NOTE — Progress Notes (Signed)
Prostate cancer- has regular f/u with urology  GERD- not requiring any meds  Lipids- not on meds  Health maint- reviewed during this visit  Past Medical History  Diagnosis Date  . Diverticulosis   . Sinusitis acute   . Seasonal allergies   . Adenoma 2008    serrated adenoma of colon  . Hyperlipidemia   . Prostate cancer 1994    radical prostatectomy Dr. Richardo Priest  . GERD (gastroesophageal reflux disease)     History   Social History  . Marital Status: Married    Spouse Name: N/A    Number of Children: N/A  . Years of Education: N/A   Occupational History  . Not on file.   Social History Main Topics  . Smoking status: Never Smoker   . Smokeless tobacco: Never Used  . Alcohol Use: 3.5 oz/week    7 drink(s) per week  . Drug Use: No  . Sexual Activity: Not on file   Other Topics Concern  . Not on file   Social History Narrative  . No narrative on file    Past Surgical History  Procedure Laterality Date  . Prostatectomy  1994    [facility]  . Appendectomy    . Tonsillectomy    . Cholecystectomy  05/2009    Family History  Problem Relation Age of Onset  . Prostate cancer Paternal Uncle   . Prostate cancer Cousin     No Known Allergies  Current Outpatient Prescriptions on File Prior to Visit  Medication Sig Dispense Refill  . Calcium Carbonate-Vitamin D 300-100 MG-UNIT CAPS Take 1 capsule by mouth daily.      [name] glucosamine-chondroitin 500-400 MG tablet Take 1 tablet by mouth daily.        [name] guaiFENesin (MUCINEX) 600 MG 12 hr tablet Take 1,200 mg by mouth 2 (two) times daily.      [name] loratadine (CLARITIN) 10 MG tablet Take 10 mg by mouth daily.        . Multiple Vitamins-Minerals (MULTIVITAMIN WITH MINERALS) tablet Take 1 tablet by mouth daily.        . Omega-3 Fatty Acids (FISH OIL) 500 MG CAPS Take 1 capsule by mouth daily.        . vitamin C (ASCORBIC ACID) 500 MG tablet Take 500 mg by mouth daily.       No current facility-administered  medications on file prior to visit.     patient denies chest pain, shortness of breath, orthopnea. Denies lower extremity edema, abdominal pain, change in appetite, change in bowel movements. Patient denies rashes, musculoskeletal complaints. No other specific complaints in a complete review of systems.   Reviewed vitals  well-developed well-nourished male in no acute distress. HEENT exam atraumatic, normocephalic, neck supple without jugular venous distention. Chest clear to auscultation cardiac exam S1-S2 are regular. Abdominal exam overweight with bowel sounds, soft and nontender. Extremities no edema. Neurologic exam is alert with a normal gait. LE with bilateral varicose veins

## 2013-04-08 NOTE — Assessment & Plan Note (Signed)
Note gradually increasing psa- will check today Lab Results  Component Value Date   PSA 3.05 07/07/2011   PSA 2.43 10/14/2009   PSA 2.39 03/10/2009

## 2013-04-08 NOTE — Assessment & Plan Note (Signed)
Lipid Panel     Component Value Date/Time   CHOL 173 07/07/2011 0946   TRIG 81.0 07/07/2011 0946   HDL 56.30 07/07/2011 0946   CHOLHDL 3 07/07/2011 0946   VLDL 16.2 07/07/2011 0946   LDLCALC 101* 07/07/2011 0946   Reviewed previous labs- no need to check (well controlled)

## 2013-04-08 NOTE — Assessment & Plan Note (Signed)
No significant sxs Check cbc today

## 2013-04-08 NOTE — Assessment & Plan Note (Signed)
Lab Results  Component Value Date   PSA 3.05 07/07/2011   PSA 2.43 10/14/2009   PSA 2.39 03/10/2009   Has regular f/u with urology

## 2014-04-11 ENCOUNTER — Encounter: Payer: Self-pay | Admitting: Internal Medicine

## 2014-04-11 ENCOUNTER — Other Ambulatory Visit: Payer: Self-pay

## 2014-06-02 ENCOUNTER — Encounter: Payer: Self-pay | Admitting: Family Medicine

## 2014-06-02 ENCOUNTER — Ambulatory Visit (INDEPENDENT_AMBULATORY_CARE_PROVIDER_SITE_OTHER): Payer: Medicare Other | Admitting: Family Medicine

## 2014-06-02 VITALS — BP 140/74 | Temp 98.0°F | Wt 159.0 lb

## 2014-06-02 DIAGNOSIS — E785 Hyperlipidemia, unspecified: Secondary | ICD-10-CM | POA: Diagnosis not present

## 2014-06-02 DIAGNOSIS — Z0000 Encounter for general adult medical examination without abnormal findings: Secondary | ICD-10-CM

## 2014-06-02 DIAGNOSIS — Z23 Encounter for immunization: Secondary | ICD-10-CM

## 2014-06-02 DIAGNOSIS — C61 Malignant neoplasm of prostate: Secondary | ICD-10-CM

## 2014-06-02 DIAGNOSIS — Z86010 Personal history of colonic polyps: Secondary | ICD-10-CM | POA: Insufficient documentation

## 2014-06-02 DIAGNOSIS — IMO0001 Reserved for inherently not codable concepts without codable children: Secondary | ICD-10-CM

## 2014-06-02 DIAGNOSIS — Z85828 Personal history of other malignant neoplasm of skin: Secondary | ICD-10-CM

## 2014-06-02 DIAGNOSIS — R7989 Other specified abnormal findings of blood chemistry: Secondary | ICD-10-CM | POA: Insufficient documentation

## 2014-06-02 LAB — COMPREHENSIVE METABOLIC PANEL
ALBUMIN: 4.2 g/dL (ref 3.5–5.2)
ALT: 20 U/L (ref 0–53)
AST: 27 U/L (ref 0–37)
Alkaline Phosphatase: 48 U/L (ref 39–117)
BUN: 15 mg/dL (ref 6–23)
CHLORIDE: 99 meq/L (ref 96–112)
CO2: 28 mEq/L (ref 19–32)
Calcium: 9 mg/dL (ref 8.4–10.5)
Creatinine, Ser: 1.1 mg/dL (ref 0.4–1.5)
GFR: 72.25 mL/min (ref >60.00)
GLUCOSE: 86 mg/dL (ref 70–99)
POTASSIUM: 4.2 meq/L (ref 3.5–5.1)
SODIUM: 134 meq/L — AB (ref 135–145)
TOTAL PROTEIN: 7 g/dL (ref 6.0–8.3)
Total Bilirubin: 2.3 mg/dL — ABNORMAL HIGH (ref 0.2–1.2)

## 2014-06-02 LAB — CBC
HCT: 44.7 % (ref 39.0–52.0)
Hemoglobin: 14.8 g/dL (ref 13.0–17.0)
MCHC: 33 g/dL (ref 30.0–36.0)
MCV: 93.1 fl (ref 78.0–100.0)
Platelets: 213 10*3/uL (ref 150.0–400.0)
RBC: 4.8 Mil/uL (ref 4.22–5.81)
RDW: 13.1 % (ref 11.5–15.5)
WBC: 6.8 10*3/uL (ref 4.0–10.5)

## 2014-06-02 LAB — LIPID PANEL
CHOL/HDL RATIO: 3
Cholesterol: 203 mg/dL — ABNORMAL HIGH (ref 0–200)
HDL: 73.1 mg/dL (ref >39.00)
LDL Cholesterol: 117 mg/dL — ABNORMAL HIGH (ref 0–99)
NONHDL: 129.9
Triglycerides: 63 mg/dL (ref 0.0–149.0)
VLDL: 12.6 mg/dL (ref 0.0–40.0)

## 2014-06-02 LAB — PSA: PSA: 6.02 ng/mL — AB (ref 0.10–4.00)

## 2014-06-02 LAB — TSH: TSH: 5.39 u[IU]/mL — AB (ref 0.35–4.50)

## 2014-06-02 NOTE — Progress Notes (Signed)
Juan Reddish, MD Phone: [phone]  Subjective:  Patient presents today to establish care with me as their new primary care provider and for annual wellness visit . Patient was formerly a patient of Dr. Leanne Chang. Chief complaint-noted.   Patient has prostate cancer with history of prostatectomy. He gets PSA in this office then follows up with urology. He has been told he needs treatment for likely metastatic disease but has declined in the past.   ROS- Full ROS completed and negative specifically no chest pain, shortness of breath, headaches. No unintentional weight loss or night sweats.  Preventive Screening-Counseling & Management  Never Smoker No smokers in home  Risk Factors He exercises regularly-45 minutes on elliptical 5 days a week.  Eats a healthy diet- rare meat, almost vegetarian No fall risk  Cardiac risk factors: advanced age (older than 67 for men, 83 for women), and high cholesterol (good protective cholesterol). No diabetes. Possible CAD history in mother.   Depression Screen None. PHQ2 0  Activities of Daily Living Independent ADLs and IADLs  Hearing Difficulties: hearing tests in past but never hearing aids-patient declines  Cognitive Testing He has no trouble.   List the Names of Other Physician/Practitioners you currently use: 1. Dr. Sharlett Iles [facility] 2. Dr. Jasmine [facility]  Immunization History  Administered Date(s) Administered  . Influenza Whole 03/28/1999  . Influenza,inj,Quad PF,36+ Mos 04/03/2013, 03/19/2014  . Pneumococcal Conjugate-13 06/02/2014  . Pneumococcal Polysaccharide-23 12/07/2006  . Td 12/07/2006  . Zoster 02/23/2010   Prevnar today.   Screening tests- up to date with exception colonoscopy which had 3 year follow up recommended in 2011, referred at this time.    The following were reviewed and entered/updated in epic: Past Medical History  Diagnosis Date  . Diverticulosis   . Sinusitis acute   . Seasonal  allergies   . Adenoma 2008    serrated adenoma of colon  . Hyperlipidemia   . Prostate cancer 1994    radical prostatectomy Dr. Gentry Fitz  . GERD (gastroesophageal reflux disease)     H. Pylori related-gone when treated   Patient Active Problem List   Diagnosis Date Noted  . Prostate cancer     Priority: Medium  . Hyperlipidemia 03/10/2009    Priority: Medium  . Elevated TSH 06/02/2014    Priority: Low  . History of colon polyps     Priority: Low  . Basal cell cancer 04/08/2013    Priority: Low  . ALLERGIC RHINITIS 08/06/2007    Priority: Low   Past Surgical History  Procedure Laterality Date  . Prostatectomy  1994    DR.MOHLER [facility]  . Appendectomy    . Tonsillectomy    . Cholecystectomy  05/2009    Family History  Problem Relation Age of Onset  . Prostate cancer Paternal Uncle   . Prostate cancer Cousin   . Pneumonia Father     aspiration  . Other  46    smoker. sudden death unclear cause    Medications- reviewed and updated Current Outpatient Prescriptions  Medication Sig Dispense Refill  . Calcium Carbonate-Vitamin D 300-100 MG-UNIT CAPS Take 1 capsule by mouth daily.    [name] glucosamine-chondroitin 500-400 MG tablet Take 1 tablet by mouth daily.      . Multiple Vitamins-Minerals (MULTIVITAMIN WITH MINERALS) tablet Take 1 tablet by mouth daily.      . Omega-3 Fatty Acids (FISH OIL) 500 MG CAPS Take 1 capsule by mouth daily.      . vitamin C (ASCORBIC  ACID) 500 MG tablet Take 500 mg by mouth daily.    [name] guaiFENesin (MUCINEX) 600 MG 12 hr tablet Take 1,200 mg by mouth 2 (two) times daily.    [name] loratadine (CLARITIN) 10 MG tablet Take 10 mg by mouth daily.       No current facility-administered medications for this visit.    Allergies-reviewed and updated No Known Allergies  History   Social History  . Marital Status: Married    Spouse Name: N/A    Number of Children: N/A  . Years of Education: N/A   Social History Main Topics  . Smoking status: Never  Smoker   . Smokeless tobacco: Never Used  . Alcohol Use: 3.5 oz/week    7 drink(s) per week  . Drug Use: No  . Sexual Activity: None   Other Topics Concern  . None   Social History Narrative   From [place]    Came down to [employer] for graduate studies- Phd in botany.    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       Married 1985.1st marriage. No kids. No pets.       Hobbies- above + gardening          ROS--See HPI   Objective: BP 140/74 mmHg  Temp(Src) 98 F (36.7 C)  Wt 159 lb (72.122 kg) Gen: NAD, resting comfortably HEENT: Mucous membranes are moist. Oropharynx normal.  Neck: no thyromegaly CV: RRR no murmurs rubs or gallops Lungs: CTAB no crackles, wheeze, rhonchi Abdomen: soft/nontender/nondistended/normal bowel sounds. No rebound or guarding.  No prostate-defers rectal Ext: no edema Skin: warm, dry, 2 x 2 cm lesion on head with multiple circular lesions in it (? [place]ca-send to dermatology for follow up) Neuro: grossly normal, moves all extremities, PERRLA   Assessment/Plan:  Annual wellness visit completed with screening above.   Prostate cancer PSA continues to increase. Referred back to urology  Hyperlipidemia With sudden death in mother at age 8 which could have been heart related,  I advised patient to take aspirin and refuses. Also discussed statin therapy which he refused. I sent a follow-up message/further elevation of cholesterol on labs today. He will let me know if he decides to take medication. To balance this,  there is not clear indication for statin therapy in primary prevention after age 3 per ASCVD risk calculator and this would be primary prevention.   Elevated TSH Elevated x 2 years, send for free t4. Recheck yearly.   Return precautions advised.   Fasting labs Orders Placed This Encounter  Procedures  .  Pneumococcal conjugate vaccine 13-valent IM  . CBC    [facility]  . Comprehensive metabolic panel    [place]    Order Specific Question:  Has the patient fasted?    Answer:  No  . Lipid panel    [place]    Order Specific Question:  Has the patient fasted?    Answer:  No  . TSH    [place]  . PSA  . Ambulatory referral to Gastroenterology    Referral Priority:  Routine    Referral Type:  Consultation    Referral Reason:  Specialty Services Required    Requested Specialty:  Gastroenterology    Number of Visits Requested:  1  . Ambulatory referral to Dermatology    Referral Priority:  Routine    Referral Type:  Consultation    Referral Reason:  Specialty Services Required    Requested Specialty:  Dermatology    Number of Visits Requested:  1  . Ambulatory referral to Urology    Referral Priority:  Routine    Referral Type:  Consultation    Referral Reason:  Specialty Services Required    Referred to Provider:  Sharyn Creamer, MD    Requested Specialty:  Urology    Number of Visits Requested:  1

## 2014-06-02 NOTE — Assessment & Plan Note (Addendum)
With sudden death in mother at age 75 which could have been heart related,  I advised patient to take aspirin and refuses. Also discussed statin therapy which he refused. I sent a follow-up message/further elevation of cholesterol on labs today. He will let me know if he decides to take medication. To balance this,  there is not clear indication for statin therapy in primary prevention after age 71 per ASCVD risk calculator and this would be primary prevention.

## 2014-06-02 NOTE — Assessment & Plan Note (Addendum)
Elevated x 2 years, send for free t4. Recheck yearly.

## 2014-06-02 NOTE — Assessment & Plan Note (Signed)
PSA continues to increase. Referred back to urology

## 2014-06-02 NOTE — Patient Instructions (Addendum)
Check PSA today due to prostate cancer  Refer to GI for colon cancer screening  Prevnar Today (final pneumonia)  Go see Dr. Ronnald Ramp to discuss the spot on his head- refer   I also referred you to urology to discuss the results of your PSA

## 2014-06-03 ENCOUNTER — Encounter: Payer: Self-pay | Admitting: Family Medicine

## 2014-06-03 ENCOUNTER — Other Ambulatory Visit (INDEPENDENT_AMBULATORY_CARE_PROVIDER_SITE_OTHER): Payer: Medicare Other

## 2014-06-03 ENCOUNTER — Encounter: Payer: Self-pay | Admitting: Internal Medicine

## 2014-06-03 DIAGNOSIS — R946 Abnormal results of thyroid function studies: Secondary | ICD-10-CM

## 2014-06-03 DIAGNOSIS — R7989 Other specified abnormal findings of blood chemistry: Secondary | ICD-10-CM

## 2014-06-03 LAB — T4, FREE: Free T4: 1.02 ng/dL (ref 0.60–1.60)

## 2014-06-12 ENCOUNTER — Ambulatory Visit: Payer: Medicare Other | Admitting: Family Medicine

## 2014-08-01 ENCOUNTER — Telehealth: Payer: Self-pay

## 2014-08-01 ENCOUNTER — Ambulatory Visit (AMBULATORY_SURGERY_CENTER): Payer: Self-pay

## 2014-08-01 VITALS — Ht 73.0 in | Wt 164.0 lb

## 2014-08-01 DIAGNOSIS — Z860100 Personal history of colon polyps, unspecified: Secondary | ICD-10-CM

## 2014-08-01 MED ORDER — MOVIPREP 100 G PO SOLR: 1.0000 | Freq: Once | ORAL | Status: DC

## 2014-08-01 NOTE — Progress Notes (Signed)
No allergies to eggs or soy No diet/weight loss meds No past problems with anesthesia No home oxygen  Has trouble with IV starts in hand  Has email  Emmi instructions given for colonoscopy

## 2014-08-01 NOTE — Telephone Encounter (Signed)
Sent to you and Dr Olevia Perches.  Will route to her again.  Thanks

## 2014-08-01 NOTE — Telephone Encounter (Signed)
Pt upset about 2 day prep, wants to alter times, please call him asap to discuss further to see if any revisions to 2 day prep can be made.  Thanks, Levada Dy Laurina Bustle

## 2014-08-01 NOTE — Telephone Encounter (Signed)
He hadf an suboptimal prep on last colonoscopy by Dr Darrel Reach in 2011. I am not sure if he did not take the whole prep or just could not be cleaned with one prep. I suggest that he stays on clear liquid only for 24 hour prior to colonoscopy and takes bottle of Mag citrate 2 nights prior to colonoscopy and then do a normal prep  The day before the colonoscopy.

## 2014-08-04 NOTE — Telephone Encounter (Signed)
Spoke with patient, gave him recommendations from Dr.Brodie.

## 2014-08-05 ENCOUNTER — Other Ambulatory Visit: Payer: Self-pay | Admitting: Dermatology

## 2014-08-15 ENCOUNTER — Encounter: Payer: Self-pay | Admitting: Internal Medicine

## 2014-08-15 ENCOUNTER — Ambulatory Visit (AMBULATORY_SURGERY_CENTER): Payer: PPO | Admitting: Internal Medicine

## 2014-08-15 VITALS — BP 130/91 | HR 65 | Temp 95.8°F | Resp 30 | Ht 73.0 in | Wt 164.0 lb

## 2014-08-15 DIAGNOSIS — Z86010 Personal history of colonic polyps: Secondary | ICD-10-CM

## 2014-08-15 MED ORDER — SODIUM CHLORIDE 0.9 % IV SOLN: 500.0000 mL | INTRAVENOUS | Status: DC

## 2014-08-15 NOTE — Op Note (Signed)
[facility]  [address] [place], [id number]   COLONOSCOPY PROCEDURE REPORT  PATIENT: Juan Lin, Juan Lin  MR#: [id number] BIRTHDATE: [birth year] , 1  yrs. old GENDER: male ENDOSCOPIST: Lafayette Dragon, MD REFERRED BY:Dr Rushie Chestnut PROCEDURE DATE:  08/15/2014 PROCEDURE:   Colonoscopy, surveillance First Screening Colonoscopy - Avg.  risk and is 50 yrs.  old or older - No.  Prior Negative Screening - Now for repeat screening. N/A  History of Adenoma - Now for follow-up colonoscopy & has been > or = to 3 yrs.  Yes hx of adenoma.  Has been 3 or more years since last colonoscopy.  Polyps Removed Today? No.  Polyps Removed Today? No.  Recommend repeat exam, <10 yrs? Polyps Removed Today? No.  Recommend repeat exam, <10 yrs? No. ASA CLASS:   Class II INDICATIONS:history of serrated adenoma on colonoscopy in August 2008.  Last exam December 2011.  The patient had poor prep.  There was a flat polyp removed snare but pathology showed normal mucosa.  MEDICATIONS: Monitored anesthesia care and Propofol 180 mg IV  DESCRIPTION OF PROCEDURE:   After the risks benefits and alternatives of the procedure were thoroughly explained, informed consent was obtained.  The digital rectal exam revealed no abnormalities of the rectum.   The LB [id number]  endoscope was introduced through the anus and advanced to the cecum, which was identified by both the appendix and ileocecal valve. No adverse events experienced.   The quality of the prep was good, using MoviPrep  The instrument was then slowly withdrawn as the colon was fully examined.      COLON FINDINGS: There was moderate diverticulosis noted throughout the entire examined colon with associated muscular hypertrophy, tortuosity and angulation.  Retroflexed views revealed no abnormalities. The time to cecum=8 minutes 56 seconds.  Withdrawal time=7 minutes 08 seconds.  The scope was withdrawn and the procedure  completed. COMPLICATIONS: There were no immediate complications.  ENDOSCOPIC IMPRESSION: There was moderate diverticulosis noted throughout the entire examined colon  RECOMMENDATIONS: High fiber diet no recall colonoscopy due to age  eSigned:  Lafayette Dragon, MD 08/15/2014 10:34 AM   cc:   PATIENT NAME:  Juan Lin, Juan Lin MR#: [id number]

## 2014-08-15 NOTE — Progress Notes (Signed)
Procedure ends, to recovery, report given, VSS. 

## 2014-08-15 NOTE — Patient Instructions (Signed)
YOU HAD AN ENDOSCOPIC PROCEDURE TODAY AT THE [facility]: Refer to the procedure report that was given to you for any specific questions about what was found during the examination.  If the procedure report does not answer your questions, please call your gastroenterologist to clarify.  If you requested that your care partner not be given the details of your procedure findings, then the procedure report has been included in a sealed envelope for you to review at your convenience later.  YOU SHOULD EXPECT: Some feelings of bloating in the abdomen. Passage of more gas than usual.  Walking can help get rid of the air that was put into your GI tract during the procedure and reduce the bloating. If you had a lower endoscopy (such as a colonoscopy or flexible sigmoidoscopy) you may notice spotting of blood in your stool or on the toilet paper. If you underwent a bowel prep for your procedure, then you may not have a normal bowel movement for a few days.  DIET: Your first meal following the procedure should be a light meal and then it is ok to progress to your normal diet.  A half-sandwich or bowl of soup is an example of a good first meal.  Heavy or fried foods are harder to digest and may make you feel nauseous or bloated.  Likewise meals heavy in dairy and vegetables can cause extra gas to form and this can also increase the bloating.  Drink plenty of fluids but you should avoid alcoholic beverages for 24 hours.  ACTIVITY: Your care partner should take you home directly after the procedure.  You should plan to take it easy, moving slowly for the rest of the day.  You can resume normal activity the day after the procedure however you should NOT DRIVE or use heavy machinery for 24 hours (because of the sedation medicines used during the test).    SYMPTOMS TO REPORT IMMEDIATELY: A gastroenterologist can be reached at any hour.  During normal business hours, 8:30 AM to 5:00 PM Monday through Friday,  call [phone].  After hours and on weekends, please call the GI answering service at [phone] who will take a message and have the physician on call contact you.   Following lower endoscopy (colonoscopy or flexible sigmoidoscopy):  Excessive amounts of blood in the stool  Significant tenderness or worsening of abdominal pains  Swelling of the abdomen that is new, acute  Fever of 100F or higher  FOLLOW UP: If any biopsies were taken you will be contacted by phone or by letter within the next 1-3 weeks.  Call your gastroenterologist if you have not heard about the biopsies in 3 weeks.  Our staff will call the home number listed on your records the next business day following your procedure to check on you and address any questions or concerns that you may have at that time regarding the information given to you following your procedure. This is a courtesy call and so if there is no answer at the home number and we have not heard from you through the emergency physician on call, we will assume that you have returned to your regular daily activities without incident.  SIGNATURES/CONFIDENTIALITY: You and/or your care partner have signed paperwork which will be entered into your electronic medical record.  These signatures attest to the fact that that the information above on your After Visit Summary has been reviewed and is understood.  Full responsibility of the confidentiality of this   discharge information lies with you and/or your care-partner.    Handouts were given to your care partner on a high fiber diet with liberal fluid intake. You might notice some irritation in your nose or drainage.  This may cause feelings of congestion.  This is from the oxygen, which can be drying.  This is no cause for concern; this should clear up in a few days.  You may resume your current medications today. Please call if any questions or concerns.

## 2014-08-15 NOTE — Progress Notes (Signed)
No problems noted in the recovery room. maw 

## 2014-08-18 ENCOUNTER — Telehealth: Payer: Self-pay | Admitting: *Deleted

## 2014-08-18 NOTE — Telephone Encounter (Signed)
No answer, message left for the patient. 

## 2014-11-14 ENCOUNTER — Telehealth: Payer: Self-pay | Admitting: Family Medicine

## 2014-11-14 NOTE — Telephone Encounter (Signed)
[place] [facility] - Client [place] [facility] Patient Name: Juan Lin DOB: Feb 13, 1941 Initial Comment caller states has a sty, called on Monday; has now popped and is bigger and has pus discharge Nurse Assessment Nurse: Markus Daft, RN, Sherre Poot Date/Time (Eastern Time): 11/14/2014 1:10:41 PM Confirm and document reason for call. If symptomatic, describe symptoms. ---Caller states has a sty on right lower eye lid noticed on Monday and since has now popped and is larger, and purulent discharge. No fever. Has the patient traveled out of the country within the last 30 days? ---Not Applicable Does the patient require triage? ---Yes Related visit to physician within the last 2 weeks? ---No Does the PT have any chronic conditions? (i.e. diabetes, asthma, etc.) ---No Guidelines Guideline Title Affirmed Question Affirmed Notes Sty Isolated sty (all triage questions negative) Final Disposition User [place], RN, Sherre Poot He will cont. To do warm wet compresses to the eye but more often As advised QID.

## 2014-11-17 ENCOUNTER — Telehealth: Payer: Self-pay | Admitting: *Deleted

## 2014-11-17 NOTE — Telephone Encounter (Signed)
See below Dr. Hunter  

## 2014-11-17 NOTE — Telephone Encounter (Signed)
PLEASE NOTE: All timestamps contained within this report are represented as [place] Standard Time. CONFIDENTIALTY NOTICE: This fax transmission is intended only for the addressee. It contains information that is legally privileged, confidential or otherwise protected from use or disclosure. If you are not the intended recipient, you are strictly prohibited from reviewing, disclosing, copying using or disseminating any of this information or taking any action in reliance on or regarding this information. If you have received this fax in error, please notify us immediately by telephone so that we can arrange for its return to [place]. Phone: [phone], Toll-Free: [phone], Fax: [phone] Page: 1 of 2 Call Id: [id number] [place] [facility] - Client [place] Patient Name: Juan Lin Gender: Male DOB: 08/14/1940 Age: 74 Y 2 M 4 D Return Phone Number: [phone] (Primary), [phone] (Secondary) Address: [address] City/State/Zip: [place] [address] Client [facility] - Client Client Site [facility] - Day Physician Garret Reddish Contact Type Call Call Type Triage / Clinical Relationship To Patient Self Appointment Disposition EMR Appointment Not Necessary Info pasted into Epic Yes Return Phone Number [phone] (Primary) Chief Complaint Eye Pus Or Discharge Initial Comment caller states has a sty, called on Monday; has now popped and is bigger and has pus discharge PreDisposition Call Doctor Nurse Assessment Nurse: Markus Daft, RN, Sherre Poot Date/Time (Eastern Time): 11/14/2014 1:10:41 PM Confirm and document reason for call. If symptomatic, describe symptoms. ---Caller states has a sty on right lower eye lid noticed on Monday and since has now popped and is larger, and purulent discharge. No fever. Has the patient traveled out of the country within the last  30 days? ---Not Applicable Does the patient require triage? ---Yes Related visit to physician within the last 2 weeks? ---No Does the PT have any chronic conditions? (i.e. diabetes, asthma, etc.) ---No Guidelines Guideline Title Affirmed Question Affirmed Notes Nurse Date/Time [name] Time) Sty Isolated sty (all triage questions negative) Markus Daft, RN, [place] 11/14/2014 1:11:54 PM Disp. Time [name] Time) Disposition Final User 11/14/2014 1:17:11 PM [place], RN, Sherre Poot Caller Understands: Yes Disagree/Comply: Comply Care Advice Given Per Guideline PLEASE NOTE: All timestamps contained within this report are represented as [place] Standard Time. CONFIDENTIALTY NOTICE: This fax transmission is intended only for the addressee. It contains information that is legally privileged, confidential or otherwise protected from use or disclosure. If you are not the intended recipient, you are strictly prohibited from reviewing, disclosing, copying using or disseminating any of this information or taking any action in reliance on or regarding this information. If you have received this fax in error, please notify us immediately by telephone so that we can arrange for its return to [place]. Phone: [phone], Toll-Free: [phone], Fax: [phone] Page: 2 of 2 Call Id: [id number] Care Advice Given Per Guideline HOME CARE: You should be able to treat this at home. REASSURANCE: A sty usually comes to a head and forms a pimple in 3 to 5 days. In a few more days, it usually drains and heals. LOCAL HEAT: * Apply a warm, wet washcloth to the eye for 10 minutes 4 times a day to help the sty come to a head. * Continue to cleanse the eye with warm water several times a day even after the sty begins to drain. * Do not rub the eye (Reason: can spread the infection). OPEN THE PIMPLE: * When the center of the sty becomes yellow, open it by pulling out  the eyelash that goes through the pimple. Use a  tweezers. This will initiate drainage and healing. * Another option is to wait for spontaneous drainage (usually 1-2 more days). * Caution: do not squeeze the red lump. OPTION - PRESCRIPTION ANTIBIOTIC EYE OINTMENT: * Most single styes respond to the treatment already outlined and don't need an antibiotic. * Indication for antibiotic eye ointment: multiple styes or recurrent styes. CALL BACK IF: * Eyelid becomes red or swollen * Sty is not improved by 5 days * Sty is not resolved by 10 days * Styes recur * You become worse. CARE ADVICE given per Sty (Adult) guideline. After Care Instructions Given Call Event Type User Date / Time Description

## 2014-11-17 NOTE — Telephone Encounter (Signed)
Patient has appointment to see [facility] tomorrow at [facility]

## 2014-11-18 ENCOUNTER — Encounter: Payer: Self-pay | Admitting: Adult Health

## 2014-11-18 ENCOUNTER — Ambulatory Visit (INDEPENDENT_AMBULATORY_CARE_PROVIDER_SITE_OTHER): Payer: PPO | Admitting: Adult Health

## 2014-11-18 VITALS — BP 124/84 | Temp 97.6°F | Wt 161.2 lb

## 2014-11-18 DIAGNOSIS — H0012 Chalazion right lower eyelid: Secondary | ICD-10-CM | POA: Diagnosis not present

## 2014-11-18 NOTE — Progress Notes (Signed)
   Subjective:    Patient ID: Juan Lin, male    DOB: 24-Sep-1940, 74 y.o.   MRN: [id number]  HPI  Patient presents for possible stye on lower right lid. He first noticed the stye on the 14th of this months, on the 16th he noticed a "boil" and started applying a warm wash cloth to the area. On the 19th it ruptured and drained. Today he endorses that the "boil" is back and that it is bigger than usual.   Denies any pain to site.   Review of Systems  Eyes: Negative for photophobia, pain, discharge, redness, itching and visual disturbance.  All other systems reviewed and are negative.      Objective:   Physical Exam  Constitutional: He is oriented to person, place, and time. He appears well-developed and well-nourished. No distress.  Eyes: Conjunctivae and EOM are normal. Pupils are equal, round, and reactive to light. Right eye exhibits no discharge. Left eye exhibits no discharge.  Small pearly gray lesion on right lower lid. Slight redness around eye. No drainage at this time.   Musculoskeletal: Normal range of motion.  Neurological: He is alert and oriented to person, place, and time.  Skin: Skin is warm and dry. No rash noted. He is not diaphoretic. No erythema. No pallor.  Psychiatric: He has a normal mood and affect. His behavior is normal. Judgment and thought content normal.  Nursing note and vitals reviewed.      Assessment & Plan:  1. Chalazion of right lower eyelid - Reviewed with MD Sherren Mocha as there was small concern for [facility].  - Dr. Sherren Mocha agreeded that the area in question was a chalazion.  - Apply warm compress to area for 15 minutes at least 4 times a day - Follow up with eye doctor if no improvement.

## 2014-11-18 NOTE — Progress Notes (Signed)
Pre visit review using our clinic review tool, if applicable. No additional management support is needed unless otherwise documented below in the visit note. 

## 2014-11-18 NOTE — Patient Instructions (Addendum)
Follow up with Bing Plume on your return from [place]. In the mean time, continue with the warm compresses   .Chalazion A chalazion is a swelling or hard lump on the eyelid caused by a blocked oil gland. Chalazions may occur on the upper or the lower eyelid.  CAUSES  Oil gland in the eyelid becomes blocked. SYMPTOMS   Swelling or hard lump on the eyelid. This lump may make it hard to see out of the eye.  The swelling may spread to areas around the eye. TREATMENT   Although some chalazions disappear by themselves in 1 or 2 months, some chalazions may need to be removed.  Medicines to treat an infection may be required. HOME CARE INSTRUCTIONS   Wash your hands often and dry them with a clean towel. Do not touch the chalazion.  Apply heat to the eyelid several times a day for 10 minutes to help ease discomfort and bring any yellowish white fluid (pus) to the surface. One way to apply heat to a chalazion is to use the handle of a metal spoon.  Hold the handle under hot water until it is hot, and then wrap the handle in paper towels so that the heat can come through without burning your skin.  Hold the wrapped handle against the chalazion and reheat the spoon handle as needed.  Apply heat in this fashion for 10 minutes, 4 times per day.  Return to your caregiver to have the pus removed if it does not break (rupture) on its own.  Do not try to remove the pus yourself by squeezing the chalazion or sticking it with a pin or needle.  Only take over-the-counter or prescription medicines for pain, discomfort, or fever as directed by your caregiver. SEEK IMMEDIATE MEDICAL CARE IF:   You have pain in your eye.  Your vision changes.  The chalazion does not go away.  The chalazion becomes painful, red, or swollen, grows larger, or does not start to disappear after 2 weeks. MAKE SURE YOU:   Understand these instructions.  Will watch your condition.  Will get help right away if you are not  doing well or get worse. Document Released: 06/10/2000 Document Revised: 09/05/2011 Document Reviewed: 09/28/2009 [facility] Patient Information 2015 [place], [place]. This information is not intended to replace advice given to you by your health care provider. Make sure you discuss any questions you have with your health care provider.

## 2015-02-12 ENCOUNTER — Encounter: Payer: Self-pay | Admitting: Gastroenterology

## 2015-06-04 ENCOUNTER — Encounter: Payer: Medicare Other | Admitting: Family Medicine

## 2015-07-02 ENCOUNTER — Telehealth: Payer: Self-pay | Admitting: *Deleted

## 2015-07-02 ENCOUNTER — Other Ambulatory Visit (INDEPENDENT_AMBULATORY_CARE_PROVIDER_SITE_OTHER): Payer: PPO

## 2015-07-02 DIAGNOSIS — Z0000 Encounter for general adult medical examination without abnormal findings: Secondary | ICD-10-CM | POA: Diagnosis not present

## 2015-07-02 LAB — CBC WITH DIFFERENTIAL/PLATELET
BASOS ABS: 0 10*3/uL (ref 0.0–0.1)
Basophils Relative: 0.5 % (ref 0.0–3.0)
Eosinophils Absolute: 0.2 10*3/uL (ref 0.0–0.7)
Eosinophils Relative: 2.2 % (ref 0.0–5.0)
HCT: 48.1 % (ref 39.0–52.0)
Hemoglobin: 16 g/dL (ref 13.0–17.0)
LYMPHS ABS: 2.4 10*3/uL (ref 0.7–4.0)
Lymphocytes Relative: 30.9 % (ref 12.0–46.0)
MCHC: 33.2 g/dL (ref 30.0–36.0)
MCV: 92.9 fl (ref 78.0–100.0)
Monocytes Absolute: 0.7 10*3/uL (ref 0.1–1.0)
Monocytes Relative: 9.8 % (ref 3.0–12.0)
NEUTROS ABS: 4.3 10*3/uL (ref 1.4–7.7)
NEUTROS PCT: 56.6 % (ref 43.0–77.0)
PLATELETS: 233 10*3/uL (ref 150.0–400.0)
RBC: 5.18 Mil/uL (ref 4.22–5.81)
RDW: 13.6 % (ref 11.5–15.5)
WBC: 7.6 10*3/uL (ref 4.0–10.5)

## 2015-07-02 LAB — HEPATIC FUNCTION PANEL
ALBUMIN: 4.3 g/dL (ref 3.5–5.2)
ALK PHOS: 51 U/L (ref 39–117)
ALT: 22 U/L (ref 0–53)
AST: 26 U/L (ref 0–37)
Bilirubin, Direct: 0.3 mg/dL (ref 0.0–0.3)
Total Bilirubin: 1.7 mg/dL — ABNORMAL HIGH (ref 0.2–1.2)
Total Protein: 7 g/dL (ref 6.0–8.3)

## 2015-07-02 LAB — PSA: PSA: 6.84 ng/mL — AB (ref 0.10–4.00)

## 2015-07-02 LAB — POCT URINALYSIS DIPSTICK
BILIRUBIN UA: NEGATIVE
GLUCOSE UA: NEGATIVE
KETONES UA: NEGATIVE
Leukocytes, UA: NEGATIVE
Nitrite, UA: NEGATIVE
PH UA: 5.5
Protein, UA: NEGATIVE
RBC UA: NEGATIVE
Urobilinogen, UA: 0.2

## 2015-07-02 LAB — BASIC METABOLIC PANEL
BUN: 14 mg/dL (ref 6–23)
CHLORIDE: 99 meq/L (ref 96–112)
CO2: 29 meq/L (ref 19–32)
CREATININE: 1.05 mg/dL (ref 0.40–1.50)
Calcium: 9.5 mg/dL (ref 8.4–10.5)
GFR: 72.83 mL/min (ref >60.00)
Glucose, Bld: 96 mg/dL (ref 70–99)
Potassium: 5.2 mEq/L — ABNORMAL HIGH (ref 3.5–5.1)
Sodium: 133 mEq/L — ABNORMAL LOW (ref 135–145)

## 2015-07-02 LAB — LIPID PANEL
CHOL/HDL RATIO: 3
Cholesterol: 179 mg/dL (ref 0–200)
HDL: 69.8 mg/dL (ref >39.00)
LDL CALC: 93 mg/dL (ref 0–99)
NONHDL: 109.24
Triglycerides: 80 mg/dL (ref 0.0–149.0)
VLDL: 16 mg/dL (ref 0.0–40.0)

## 2015-07-02 LAB — TSH: TSH: 6.52 u[IU]/mL — AB (ref 0.35–4.50)

## 2015-07-02 NOTE — Telephone Encounter (Signed)
Patient came into office with concerns of "bump of fluid" on left patella. Patient states he was dancing last week and noticed small area of fluid on top of patella. Asymptomatic. Site is not red or tender. Only observation was the small amount of collected fluid on patella. Patient does confirm fluid has gone down over time. Spoke with Dr. Yong Channel and he says it sounds like knee bursitis, put ice on area and to notify office if not better within next few days. Patient has appointment with Dr. Yong Channel already next week for physical. Patient is aware of instructions.

## 2015-07-03 ENCOUNTER — Encounter: Payer: Self-pay | Admitting: Family Medicine

## 2015-07-09 ENCOUNTER — Encounter: Payer: Self-pay | Admitting: Family Medicine

## 2015-07-09 ENCOUNTER — Ambulatory Visit (INDEPENDENT_AMBULATORY_CARE_PROVIDER_SITE_OTHER): Payer: PPO | Admitting: Family Medicine

## 2015-07-09 VITALS — BP 100/64 | HR 94 | Temp 98.2°F | Ht 74.0 in | Wt 163.0 lb

## 2015-07-09 DIAGNOSIS — C61 Malignant neoplasm of prostate: Secondary | ICD-10-CM

## 2015-07-09 DIAGNOSIS — Z0000 Encounter for general adult medical examination without abnormal findings: Secondary | ICD-10-CM

## 2015-07-09 DIAGNOSIS — E785 Hyperlipidemia, unspecified: Secondary | ICD-10-CM

## 2015-07-09 DIAGNOSIS — R7989 Other specified abnormal findings of blood chemistry: Secondary | ICD-10-CM

## 2015-07-09 NOTE — Assessment & Plan Note (Signed)
psa slow trend up. Encouraged urology follow up. Patient wants to continue care here only but advised I would need more clear guidelines for following from them and when to send back Lab Results  Component Value Date   PSA 6.84* 07/02/2015   PSA 6.02* 06/02/2014   PSA 5.18* 04/08/2013

## 2015-07-09 NOTE — Progress Notes (Signed)
Juan Reddish, MD Phone: [phone]  Subjective:  Patient presents today for their annual physical. Chief complaint-noted.   See problem oriented charting- ROS- full  review of systems was completed and negative except for: No chest pain or shortness of breath. No headache or blurry vision.   The following were reviewed and entered/updated in epic: Past Medical History  Diagnosis Date  . Diverticulosis   . Sinusitis acute   . Seasonal allergies   . Adenoma 2008    serrated adenoma of colon  . Hyperlipidemia   . Prostate cancer 1994    radical prostatectomy Dr. Gentry Fitz  . GERD (gastroesophageal reflux disease)     H. Pylori related-gone when treated   Patient Active Problem List   Diagnosis Date Noted  . Prostate cancer [facility])     Priority: Medium  . Hyperlipidemia 03/10/2009    Priority: Medium  . Elevated TSH 06/02/2014    Priority: Low  . History of colon polyps     Priority: Low  . Basal cell cancer 04/08/2013    Priority: Low  . ALLERGIC RHINITIS 08/06/2007    Priority: Low   Past Surgical History  Procedure Laterality Date  . Prostatectomy  1994    DR.[facility]HL[facility]  . Appendectomy    . Tonsillectomy    . Cholecystectomy  05/2009    Family History  Problem Relation Age of Onset  . Prostate cancer Paternal Uncle   . Prostate cancer Cousin   . Pneumonia Father     aspiration  . Other  46    smoker. sudden death unclear cause  . Colon cancer Neg Hx     Medications- reviewed and updated Current Outpatient Prescriptions  Medication Sig Dispense Refill  . Calcium Carbonate-Vitamin D 300-100 MG-UNIT CAPS Take 1 capsule by mouth daily.    . fluorouracil (EFUDEX) 5 % cream   0  . glucosamine-chondroitin 500-400 MG tablet Take 1 tablet by mouth daily.      [name] guaiFENesin (MUCINEX) 600 MG 12 hr tablet Take 1,200 mg by mouth 2 (two) times daily.    [name] loratadine (CLARITIN) 10 MG tablet Take 10 mg by mouth daily.      . Multiple Vitamins-Minerals  (MULTIVITAMIN WITH MINERALS) tablet Take 1 tablet by mouth daily.      . Omega-3 Fatty Acids (FISH OIL) 500 MG CAPS Take 1 capsule by mouth daily.      . vitamin C (ASCORBIC ACID) 500 MG tablet Take 500 mg by mouth daily.     No current facility-administered medications for this visit.    Allergies-reviewed and updated No Known Allergies  Social History   Social History  . Marital Status: Married    Spouse Name: N/A  . Number of Children: N/A  . Years of Education: N/A   Social History Main Topics  . Smoking status: Never Smoker   . Smokeless tobacco: Never Used  . Alcohol Use: 4.2 oz/week    7 Standard drinks or equivalent per week  . Drug Use: No  . Sexual Activity: Not on file   Other Topics Concern  . Not on file   Social History Narrative   From [place]    Came down to [facility] [facility] for graduate studies- Phd in botany.    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       Married 1985.1st marriage. No  kids. No pets.       Hobbies- above + gardening         Objective: BP 100/64 mmHg  Pulse 94  Temp(Src) 98.2 F (36.8 C)  Ht 6\' 2"  (1.88 m)  Wt 163 lb (73.936 kg)  BMI 20.92 kg/m2 Gen: NAD, resting comfortably HEENT: Mucous membranes are moist. Oropharynx normal Neck: no thyromegaly CV: RRR no murmurs rubs or gallops Lungs: CTAB no crackles, wheeze, rhonchi Abdomen: soft/nontender/nondistended/normal bowel sounds. No rebound or guarding.  Rectal: no nodules- prostate surgically absent Ext: no edema Skin: warm, dry Neuro: grossly normal, moves all extremities, PERRLA  Assessment/Plan:  75 y.o. male presenting for annual physical.  Health Maintenance counseling: 1. Anticipatory guidance: Patient counseled regarding regular dental exams, eye exams, wearing seatbelts.  2. Risk factor reduction:  Advised patient of need for regular exercise  and diet rich and fruits and vegetables to reduce risk of heart attack and stroke.  3. Immunizations/screenings/ancillary studies- up to date Immunization History  Administered Date(s) Administered  . Influenza Whole 03/28/1999  . Influenza,inj,Quad PF,36+ Mos 04/03/2013, 03/19/2014  . Influenza-Unspecified 04/24/2015  . Pneumococcal Conjugate-13 06/02/2014  . Pneumococcal Polysaccharide-23 12/07/2006  . Td 12/07/2006  . Zoster 02/23/2010   4. Prostate cancer screening- prostatectomy in 1994. Will take results to urology for yearly follow up  Lab Results  Component Value Date   PSA 6.84* 07/02/2015   PSA 6.02* 06/02/2014   PSA 5.18* 04/08/2013   5. Colon cancer screening - 2016 no polyp and no further follow up per GI due to age 81. Skin cancer screening- followe dby Dr. Ronnald Ramp of dermatology. History of basal cell  Hyperlipidemia declines aspirin. Statin therapy unclear benefit for primary prevention. In addition- lipids look excellent and improved from year prior  Elevated TSH Elevated TSH- slowly trending upwards. Asymptomatic. Will continue to trend as long as under 10 and no symptoms . 2013 was 6.85, 2015 5.39.  Lab Results  Component Value Date   TSH 6.52* 07/02/2015    Prostate cancer psa slow trend up. Encouraged urology follow up. Patient wants to continue care here only but advised I would need more clear guidelines for following from them and when to send back Lab Results  Component Value Date   PSA 6.84* 07/02/2015   PSA 6.02* 06/02/2014   PSA 5.18* 04/08/2013      1 year follow up-  Return precautions advised.

## 2015-07-09 NOTE — Assessment & Plan Note (Signed)
declines aspirin. Statin therapy unclear benefit for primary prevention. In addition- lipids look excellent and improved from year prior

## 2015-07-09 NOTE — Patient Instructions (Signed)
My only concern is your PSA continuing to slowly trend up. i would see urology and get their opinion. I know you would prefer to not continue those visits but i would need some guidelines from them on whether to continue PSA testing and when to send you back if continues to trend up.   Consider an aspirin 81 mg (baby aspirin) for cardiac protection (evidence is insufficient for or against this practice to recommend firmly)  Keep checking thyroid yearly  Otherwise things look great! Fantastic really looked great

## 2015-07-09 NOTE — Assessment & Plan Note (Signed)
Elevated TSH- slowly trending upwards. Asymptomatic. Will continue to trend as long as under 10 and no symptoms . 2013 was 6.85, 2015 5.39.  Lab Results  Component Value Date   TSH 6.52* 07/02/2015

## 2015-08-06 DIAGNOSIS — I8392 Asymptomatic varicose veins of left lower extremity: Secondary | ICD-10-CM | POA: Diagnosis not present

## 2015-08-06 DIAGNOSIS — L821 Other seborrheic keratosis: Secondary | ICD-10-CM | POA: Diagnosis not present

## 2015-08-06 DIAGNOSIS — L570 Actinic keratosis: Secondary | ICD-10-CM | POA: Diagnosis not present

## 2015-08-06 DIAGNOSIS — D1801 Hemangioma of skin and subcutaneous tissue: Secondary | ICD-10-CM | POA: Diagnosis not present

## 2015-08-06 DIAGNOSIS — I8391 Asymptomatic varicose veins of right lower extremity: Secondary | ICD-10-CM | POA: Diagnosis not present

## 2015-08-06 DIAGNOSIS — Z85828 Personal history of other malignant neoplasm of skin: Secondary | ICD-10-CM | POA: Diagnosis not present

## 2015-09-01 ENCOUNTER — Ambulatory Visit (INDEPENDENT_AMBULATORY_CARE_PROVIDER_SITE_OTHER): Payer: PPO | Admitting: Family Medicine

## 2015-09-01 ENCOUNTER — Encounter: Payer: Self-pay | Admitting: Family Medicine

## 2015-09-01 VITALS — BP 98/70 | HR 101 | Temp 98.9°F | Wt 170.0 lb

## 2015-09-01 DIAGNOSIS — M7042 Prepatellar bursitis, left knee: Secondary | ICD-10-CM | POA: Diagnosis not present

## 2015-09-01 NOTE — Patient Instructions (Signed)
We will call you within a week about your referral to sports medicine. If you do not hear within 2 weeks, give [place] a call. Hopeful he can see you within 1-2 weeks.   If worsening pain, expanding, or redness- please let us know

## 2015-09-01 NOTE — Progress Notes (Signed)
Garret Reddish, MD  Subjective:  Juan Lin is a 75 y.o. year old very pleasant male patient who presents for/with See problem oriented charting ROS- no fever, chills, nausea, vomiting, expanding redness around knee, worsening pain  Past Medical History-  Patient Active Problem List   Diagnosis Date Noted  . Elevated TSH 06/02/2014    Priority: Medium  . Prostate cancer [facility])     Priority: Medium  . Hyperlipidemia 03/10/2009    Priority: Medium  . History of colon polyps     Priority: Low  . Basal cell cancer 04/08/2013    Priority: Low  . ALLERGIC RHINITIS 08/06/2007    Priority: Low    Medications- reviewed and updated Current Outpatient Prescriptions  Medication Sig Dispense Refill  . cetirizine (ZYRTEC) 10 MG tablet Take 10 mg by mouth daily.    [name] glucosamine-chondroitin 500-400 MG tablet Take 1 tablet by mouth daily.      . Multiple Vitamins-Minerals (MULTIVITAMIN WITH MINERALS) tablet Take 1 tablet by mouth daily.      . Omega-3 Fatty Acids (FISH OIL) 500 MG CAPS Take 1 capsule by mouth daily.       No current facility-administered medications for this visit.    Objective: BP 98/70 mmHg  Pulse 101  Temp(Src) 98.9 F (37.2 C)  Wt 170 lb (77.111 kg) Gen: NAD, resting comfortably CV: RRR no murmurs rubs or gallops Lungs: CTAB no crackles, wheeze, rhonchi Abdomen: soft/nontender/nondistended/normal bowel sounds. Thin Ext: no edema Skin: warm, dry, no rash   Left Knee: Normal to inspection with no erythema  obvious bony abnormalities but does have obvious 5 x 5 x 2 cm raised area that appears to be fluid filled.  Palpation normal with no warmth or joint line tenderness or patellar tenderness or condyle tenderness. ROM normal in flexion and extension and lower leg rotation. Ligaments with solid consistent endpoints including ACL, PCL, LCL, MCL. Negative Mcmurray's and provocative meniscal tests. Patellar and quadriceps tendons unremarkable. Hamstring and  quadriceps strength is normal.   Assessment/Plan:   Prepatellar bursitis of left knee - Plan: Ambulatory referral to Sports Medicine S: Patient is an avid dancer with several activities that can be high impact to knee. In early January ntoed a "bump of fluid" on left patella. We discussed conservative treatment by phone and then he saw me about a week better and area was improving though not gone. Did not appear infected at that time and we discussed verbally likely prepatellar bursitis- did not document exam or discussion but we did discuss continued conservative care at that time. Patient states was improving but everytime it has he dances again and seems to worsen again. He does have some lateral and medial knee pain but no pain over area of swelling.  A/P: Discussed aspiration with crystal analysis, cell count, culture- to rule out gout and infection (though think these not highly likely). Patient feels like area is more "gel like" now and would like more information before aspiration considering potential infection risk. For this reason referred to sports medicine to consider ultrasound before aspiration. We did discuss as more firm than prior could be lipoma but that i had low suspicion. I suspect he is having repeat trauma from his high impact dancing and just has a more taught bursa.   Return precautions advised.   Orders Placed This Encounter  Procedures  . Ambulatory referral to Sports Medicine    Referral Priority:  Routine    Referral Type:  Consultation  Referred to Provider:  Lyndal Pulley, DO    Number of Visits Requested:  1   The duration of face-to-face time during this visit was 15 minutes. Greater than 50% of this time was spent in counseling, explanation of diagnosis, planning of further management, and/or coordination of care.

## 2015-09-16 ENCOUNTER — Other Ambulatory Visit (INDEPENDENT_AMBULATORY_CARE_PROVIDER_SITE_OTHER): Payer: PPO

## 2015-09-16 ENCOUNTER — Ambulatory Visit (INDEPENDENT_AMBULATORY_CARE_PROVIDER_SITE_OTHER): Payer: PPO | Admitting: Family Medicine

## 2015-09-16 ENCOUNTER — Encounter: Payer: Self-pay | Admitting: Family Medicine

## 2015-09-16 VITALS — BP 118/82 | HR 80 | Ht 74.0 in | Wt 165.0 lb

## 2015-09-16 DIAGNOSIS — M7042 Prepatellar bursitis, left knee: Secondary | ICD-10-CM | POA: Insufficient documentation

## 2015-09-16 DIAGNOSIS — M25562 Pain in left knee: Secondary | ICD-10-CM

## 2015-09-16 NOTE — Patient Instructions (Signed)
Good to see you Was a bursea Gave injection and drained it again . COmpression sleeve with dancing pennsaid pinkie amount topically 2 times daily as needed for pain  See me again when you need me.

## 2015-09-16 NOTE — Progress Notes (Signed)
Pre visit review using our clinic review tool, if applicable. No additional management support is needed unless otherwise documented below in the visit note. 

## 2015-09-16 NOTE — Progress Notes (Signed)
[facility] [place] [place], [place] [address] Phone: [phone] Subjective:    I'm seeing this patient by the request  of:  Garret Reddish, MD   CC: left knee swelling  [id number] Juan Lin is a 75 y.o. male coming in with complaint of left knee pain. Patient states that it is more of a swelling and pain. 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 Since then he is had this area. No significant instability. Seems to hurt on the anterior aspect of the knee. Patient states his only with flexion. Patient has not tried any significant home modalities. Saw his primary  Provider.patient was sent here for further evaluation before any intervention was done. Patient rates the severity pain is 2 out of 10 but is concerned because it has not, weight. Patient states that maybe get bigger every time he seems to dance. Putting pressure on the area can cause severe pain as well. Does not remember any specific injury.     Past Medical History  Diagnosis Date  . Diverticulosis   . Sinusitis acute   . Seasonal allergies   . Adenoma 2008    serrated adenoma of colon  . Hyperlipidemia   . Prostate cancer [facility]) 1994    radical prostatectomy Dr. Gentry Fitz  . GERD (gastroesophageal reflux disease)     H. Pylori related-gone when treated   Past Surgical History  Procedure Laterality Date  . Prostatectomy  1994    DR[facility]  . Appendectomy    . Tonsillectomy    . Cholecystectomy  05/2009   Social History   Social History  . Marital Status: Married    Spouse Name: N/A  . Number of Children: N/A  . Years of Education: N/A   Social History Main Topics  . Smoking status: Never Smoker   . Smokeless tobacco: Never Used  . Alcohol Use: 4.2 oz/week    7 Standard drinks or equivalent per week  . Drug Use: No  . Sexual Activity: Not Asked   Other Topics Concern  . None   Social History  Narrative   [place]    Came down to [employer] for graduate studies- Phd in botany.    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       Married 1985.1st marriage. No kids. No pets.       Hobbies- above + gardening         No Known Allergies Family History  Problem Relation Age of Onset  . Prostate cancer Paternal Uncle   . Prostate cancer Cousin   . Pneumonia Father     aspiration  . Other  46    smoker. sudden death unclear cause  . Colon cancer Neg Hx     Past medical history, social, surgical and family history all reviewed in electronic medical record.  No pertanent information unless stated regarding to the chief complaint.   Review of Systems: No headache, visual changes, nausea, vomiting, diarrhea, constipation, dizziness, abdominal pain, skin rash, fevers, chills, night sweats, weight loss, swollen lymph nodes, body aches, joint swelling, muscle aches, chest pain, shortness of breath, mood changes.   Objective Blood pressure 118/82, pulse 80, height 6\' 2"  (1.88 m), weight 165 lb (74.844 kg), SpO2 97 %.  General: No apparent distress alert and oriented x3 mood and affect normal, dressed appropriately.  HEENT: Pupils equal, extraocular movements intact  Respiratory: Patient's speak in full sentences and does not appear short of breath  Cardiovascular: No lower extremity edema, non tender, no erythema  Skin: Warm dry intact with no signs of infection or rash on extremities or on axial skeleton.  Abdomen: Soft nontender  Neuro: Cranial nerves II through XII are intact, neurovascularly intact in all extremities with 2+ DTRs and 2+ pulses.  Lymph: No lymphadenopathy of posterior or anterior cervical chain or axillae bilaterally.  Gait normal with good balance and coordination.  MSK:  Non tender with full range of motion and good stability and  symmetric strength and tone of shoulders, elbows, wrist, hip, and ankles bilaterally.  Knee:left Large fluctuant mass on the anterior aspect of the knee. No erythema noted. Minimal tenderness on exam Palpation normal with no warmth, joint line tenderness, patellar tenderness, or condyle tenderness. ROM full in flexion and extension and lower leg rotation.patient does have some discomfort on the anterior aspect of the knee with full flexion Ligaments with solid consistent endpoints including ACL, PCL, LCL, MCL. Negative Mcmurray's, Apley's, and Thessalonian tests. Non painful patellar compression. Patellar glide mildcrepitus. Patellar and quadriceps tendons unremarkable. Hamstring and quadriceps strength is normal.  Contralateral knee unremarkable  MSK US performed of: left knee This study was ordered, performed, and interpreted by Charlann Boxer D.O.  Knee: All structures visualized. Anteromedial, anterolateral, posteromedial, and posterolateral menisci unremarkable without tearing, fraying, effusion, or displacement. Patellar Tendon unremarkable on long and transverse views without effusion. No abnormality of prepatellar bursa. LCL and MCL unremarkable on long and transverse views. No abnormality of origin of medial or lateral head of the gastrocnemius. Patient does have significant swelling of the prepatellar bursa sac. Hypoechoic changes.   IMPRESSION:  Prepatellar bursitis  Procedure: Real-time Ultrasound Guided Injection of left prepatellar bursa Device: GE Logiq E  Ultrasound guided injection is preferred based studies that show increased duration, increased effect, greater accuracy, decreased procedural pain, increased response rate, and decreased cost with ultrasound guided versus blind injection.  Verbal informed consent obtained.  Time-out conducted.  Noted no overlying erythema, induration, or other signs of local infection.  Skin prepped in a sterile fashion.  Local  anesthesia: Topical Ethyl chloride.  With sterile technique and under real time ultrasound guidance:  An 18-gauge 1/2 inch needle patient was injected with 1 mL of 0.5% Marcaine. Patient and had aspiration of 15 mL of frank blood removed from the bursa sac.patient is an injection of 1 mL of Kenalog 40 mg/dL Completed without difficulty  Pain immediately resolved suggesting accurate placement of the medication.  Advised to call if fevers/chills, erythema, induration, drainage, or persistent bleeding.  Images permanently stored and available for review in the ultrasound unit.  Impression: Technically successful ultrasound guided injection.    Impression and Recommendations:     This case required medical decision making of moderate complexity.      Note: This dictation was prepared with Dragon dictation along with smaller phrase technology. Any transcriptional errors that result from this process are unintentional.

## 2015-09-16 NOTE — Assessment & Plan Note (Signed)
Patient did have a hemorrhagic prepatellar bursitis. States that it was quite some time ago. We did have an aspiration done today. We did discuss compression and patient given topical anti-inflammatories. We discussed icing regimen. Compression sleeve was recommended. Patient and will come back and see me again in 3 weeks. We discussed that we may need to have aspiration done different time. With patient's history also of cancer we may need to consider further workup that I do not see any signs of systemic illness at this time.

## 2015-09-30 ENCOUNTER — Encounter: Payer: Self-pay | Admitting: Adult Health

## 2015-11-03 ENCOUNTER — Telehealth: Payer: Self-pay | Admitting: Family Medicine

## 2015-11-03 NOTE — Telephone Encounter (Signed)
Patient Name: Juan Lin  DOB: [birth year]    Initial Comment caller states he has a splinter deep in his finger   Nurse Assessment  Nurse: [name], RN, [name] Date/Time [name] Time): 11/03/2015 3:25:36 PM  Confirm and document reason for call. If symptomatic, describe symptoms. You must click the next button to save text entered. ---Caller states he has a splinter deep in his left index finger. He is unable to remove it on his own.  Has the patient traveled out of the country within the last 30 days? ---Not Applicable  Does the patient have any new or worsening symptoms? ---Yes  Will a triage be completed? ---Yes  Related visit to physician within the last 2 weeks? ---No  Does the PT have any chronic conditions? (i.e. diabetes, asthma, etc.) ---No  Is this a behavioral health or substance abuse call? ---No     Guidelines    Guideline Title Affirmed Question Affirmed Notes  Skin Foreign Body [1] Caller can't get FB out AND [2] causing no pain (Exception: tiny, superficial, pain-free FBs; since these don't need to be removed)    Final Disposition User   See Physician within 24 Hours [name], RN, [name]    Referrals  REFERRED TO PCP OFFICE   Disagree/Comply: [name]

## 2015-11-03 NOTE — Telephone Encounter (Signed)
Pt scheduled for 11/04/15 

## 2015-11-04 ENCOUNTER — Encounter: Payer: Self-pay | Admitting: Family Medicine

## 2015-11-04 ENCOUNTER — Ambulatory Visit (INDEPENDENT_AMBULATORY_CARE_PROVIDER_SITE_OTHER): Payer: PPO | Admitting: Family Medicine

## 2015-11-04 VITALS — BP 120/70 | HR 79 | Temp 98.0°F | Wt 166.0 lb

## 2015-11-04 DIAGNOSIS — Z23 Encounter for immunization: Secondary | ICD-10-CM

## 2015-11-04 DIAGNOSIS — T148 Other injury of unspecified body region: Secondary | ICD-10-CM | POA: Diagnosis not present

## 2015-11-04 DIAGNOSIS — M795 Residual foreign body in soft tissue: Secondary | ICD-10-CM | POA: Diagnosis not present

## 2015-11-04 DIAGNOSIS — T148XXA Other injury of unspecified body region, initial encounter: Secondary | ICD-10-CM

## 2015-11-04 NOTE — Progress Notes (Signed)
Subjective:  Juan Lin is a 75 y.o. year old very pleasant male patient who presents for/with See problem oriented charting ROS- see  ROS included in HPI   Past Medical History-  Patient Active Problem List   Diagnosis Date Noted  . Elevated TSH 06/02/2014    Priority: Medium  . Prostate cancer [facility])     Priority: Medium  . Hyperlipidemia 03/10/2009    Priority: Medium  . History of colon polyps     Priority: Low  . Basal cell cancer 04/08/2013    Priority: Low  . ALLERGIC RHINITIS 08/06/2007    Priority: Low  . Hemorrhagic prepatellar bursitis of left knee 09/16/2015    Medications- reviewed and updated Current Outpatient Prescriptions  Medication Sig Dispense Refill  . cetirizine (ZYRTEC) 10 MG tablet Take 10 mg by mouth daily.    [name] glucosamine-chondroitin 500-400 MG tablet Take 1 tablet by mouth daily.      . Multiple Vitamins-Minerals (MULTIVITAMIN WITH MINERALS) tablet Take 1 tablet by mouth daily.      . Omega-3 Fatty Acids (FISH OIL) 500 MG CAPS Take 1 capsule by mouth daily.       No current facility-administered medications for this visit.    Objective: BP 120/70 mmHg  Pulse 79  Temp(Src) 98 F (36.7 C)  Wt 166 lb (75.297 kg) Gen: NAD, resting comfortably CV: RRR no murmurs rubs or gallops Lungs: CTAB no crackles, wheeze, rhonchi Abdomen: soft/nontender/nondistended/normal bowel sounds. No rebound or guarding.  Ext: no edema Skin: warm, dry, imbedded in his left pointer finger at DIP joint on palmar side is a small black lesion- see further description in assessment/plan. Area is tender to touch.   Assessment/Plan:  Splinter in skin - Plan: Tdap vaccine greater than or equal to 7yo IM Foreign body (FB) in soft tissue - Plan: Tdap vaccine greater than or equal to 7yo IM S: Patient was working with roses and got a thorn imbedded in his left pointer finger at DIP joint on palmar side. He was able to remove some of it but noted a deeper dark spot and  though injury occurred 2 days ago- has had worsening pain. Tried to get this out with a sterilized needle without success. Complains of moderate aching especially with palpation. No radiation of pain.  ROS- no fever, chills, nausea, vomiting, expanding redness A/P: approximately 0.25 cc of lidocaine without epinephrine administered. Scalpel was used to debride the top of the lesion- no deep incision was required. Eventually the end of the splinter became prominent and able to remove with dragging scalpel along it. Patient was abotu 9 years out from tetanus shot so was given Tdap today. Of note, office was out of Td. I told patient to reach out to me if he is charged for this so we can discuss with admin (we should always have plain Td available).   Return precautions advised.   Orders Placed This Encounter  Procedures  . Tdap vaccine greater than or equal to 7yo IM   Garret Reddish, MD

## 2015-11-04 NOTE — Patient Instructions (Signed)
Update tetanus shot before you leave   Sliver Removal, Care After A sliver--also called a splinter--is a small and thin broken piece of an object that gets stuck (embedded) under the skin. A sliver can create a deep wound that can easily become infected. It is important to care for the wound after a sliver is removed to help prevent infection and other problems from developing. WHAT TO EXPECT AFTER THE PROCEDURE Slivers often break into smaller pieces when they are removed. If pieces of your sliver broke off and stayed in your skin, you will eventually see them working themselves out and you may feel some pain at the wound site. This is normal. HOME CARE INSTRUCTIONS  Keep all follow-up visits as directed by your health care provider. This is important.  There are many different ways to close and cover a wound, including stitches (sutures) and adhesive strips. Follow your health care provider's instructions about:  Wound care.  Bandage (dressing) changes and removal.  Wound closure removal.  Check the wound site every day for signs of infection. Watch for:  Red streaks coming from the wound.  Fever.  Redness or tenderness around the wound.  Fluid, blood, or pus coming from the wound.  A bad smell coming from the wound. SEEK MEDICAL CARE IF:  You think that a piece of the sliver is still in your skin.  Your wound was closed, as with sutures, and the edges of the wound break open.  You have signs of infection, including:  New or worsening redness around the wound.  New or worsening tenderness around the wound.  Fluid, blood, or pus coming from the wound.  A bad smell coming from the wound or dressing. SEEK IMMEDIATE MEDICAL CARE IF: You have any of the following signs of infection:  Red streaks coming from the wound.  An unexplained fever.   This information is not intended to replace advice given to you by your health care provider. Make sure you discuss any  questions you have with your health care provider.   Document Released: 06/10/2000 Document Revised: 07/04/2014 Document Reviewed: 02/13/2014 Elsevier Interactive Patient Education [employer].

## 2015-11-12 DIAGNOSIS — H524 Presbyopia: Secondary | ICD-10-CM | POA: Diagnosis not present

## 2015-11-12 DIAGNOSIS — H5211 Myopia, right eye: Secondary | ICD-10-CM | POA: Diagnosis not present

## 2015-11-12 DIAGNOSIS — H2513 Age-related nuclear cataract, bilateral: Secondary | ICD-10-CM | POA: Diagnosis not present

## 2015-11-12 DIAGNOSIS — H5202 Hypermetropia, left eye: Secondary | ICD-10-CM | POA: Diagnosis not present

## 2015-11-12 DIAGNOSIS — H52223 Regular astigmatism, bilateral: Secondary | ICD-10-CM | POA: Diagnosis not present

## 2015-12-09 ENCOUNTER — Ambulatory Visit (INDEPENDENT_AMBULATORY_CARE_PROVIDER_SITE_OTHER): Payer: PPO | Admitting: Family Medicine

## 2015-12-09 ENCOUNTER — Encounter: Payer: Self-pay | Admitting: Family Medicine

## 2015-12-09 VITALS — BP 120/96 | HR 85 | Temp 98.0°F | Ht 74.0 in | Wt 164.0 lb

## 2015-12-09 DIAGNOSIS — J309 Allergic rhinitis, unspecified: Secondary | ICD-10-CM

## 2015-12-09 NOTE — Progress Notes (Signed)
Pre visit review using our clinic review tool, if applicable. No additional management support is needed unless otherwise documented below in the visit note. 

## 2015-12-09 NOTE — Progress Notes (Signed)
   Subjective:    Patient ID: Juan Lin, male    DOB: 1941/06/01, 75 y.o.   MRN: [id number]  HPI  Acute visit for sinus congestive symptoms. Onset about one week ago after doing some gardening. He has noticed some fullness and his frontal and maxillary sinuses. Occasional postnasal drip symptoms-clear mucus. Intermittent mild headaches. Denies any bloody or purulent nasal discharge. No fevers or chills. No cough. He took some of his wife's Flonase with minimal improvement. Also took some over-the-counter guaifenesin. Uses Nettie pot and saline irrigation occasionally. No upper teeth pain.  Past Medical History  Diagnosis Date  . Diverticulosis   . Sinusitis acute   . Seasonal allergies   . Adenoma 2008    serrated adenoma of colon  . Hyperlipidemia   . Prostate cancer [facility]) 1994    radical prostatectomy Dr. Gentry Fitz  . GERD (gastroesophageal reflux disease)     H. Pylori related-gone when treated   Past Surgical History  Procedure Laterality Date  . Prostatectomy  1994    DR.MOHLER [facility]  . Appendectomy    . Tonsillectomy    . Cholecystectomy  05/2009    reports that he has never smoked. He has never used smokeless tobacco. He reports that he drinks about 4.2 oz of alcohol per week. He reports that he does not use illicit drugs. family history includes Pneumonia in his father; Prostate cancer in his cousin and paternal uncle. There is no history of Colon cancer. No Known Allergies   Review of Systems  Constitutional: Negative for fever and chills.  HENT: Positive for congestion and postnasal drip. Negative for sore throat.   Respiratory: Negative for cough.   Neurological: Positive for headaches.       Objective:   Physical Exam  Constitutional: He appears well-developed and well-nourished.  HENT:  Right Ear: External ear normal.  Left Ear: External ear normal.  Mouth/Throat: Oropharynx is clear and moist.  Neck: Neck supple.  Cardiovascular: Normal  rate and regular rhythm.   Pulmonary/Chest: Effort normal and breath sounds normal. No respiratory distress. He has no wheezes. He has no rales.  Lymphadenopathy:    He has no cervical adenopathy.          Assessment & Plan:  Rhinitis. Suspect mostly allergic. Doubt bacterial sinusitis. Continue Flonase once daily. Consider over-the-counter antihistamine such as Claritin or Zyrtec. Continue with saline nasal irrigation.   Follow-up promptly for any fever, purulent secretions, or other concerns  Eulas Post MD [facility]

## 2015-12-09 NOTE — Patient Instructions (Signed)
Stay well hydrated Consider Mucinex (Plain) twice daily-1,200 mg twice daily. Consider getting  Back on Flonase once daily Continue saline irrigation with Netti pot.

## 2016-04-18 ENCOUNTER — Encounter: Payer: Self-pay | Admitting: Family Medicine

## 2016-06-17 ENCOUNTER — Ambulatory Visit (INDEPENDENT_AMBULATORY_CARE_PROVIDER_SITE_OTHER): Payer: PPO

## 2016-06-17 VITALS — BP 134/88 | HR 86 | Ht 73.5 in | Wt 168.1 lb

## 2016-06-17 DIAGNOSIS — Z0000 Encounter for general adult medical examination without abnormal findings: Secondary | ICD-10-CM

## 2016-06-17 NOTE — Patient Instructions (Addendum)
Juan Lin , Thank you for taking time to come for your Medicare Wellness Visit. I appreciate your ongoing commitment to your health goals. Please review the following plan we discussed and let me know if I can assist you in the future.   Keep enjoying your life!   These are the goals we discussed: Goals    . 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 Tetanus Vaccine  11/03/2025  . Flu Shot  Completed  . Shingles Vaccine  Completed  . Pneumonia vaccines  Completed      Fall Prevention in the Home Introduction Falls can cause injuries. They can happen to people of all ages. There are many things you can do to make your home safe and to help prevent falls. What can I do on the outside of my home?  Regularly fix the edges of walkways and driveways and fix any cracks.  Remove anything that might make you trip as you walk through a door, such as a raised step or threshold.  Trim any bushes or trees on the path to your home.  Use bright outdoor lighting.  Clear any walking paths of anything that might make someone trip, such as rocks or tools.  Regularly check to see if handrails are loose or broken. Make sure that both sides of any steps have handrails.  Any raised decks and porches should have guardrails on the edges.  Have any leaves, snow, or ice cleared regularly.  Use sand or salt on walking paths during winter.  Clean up any spills in your garage right away. This includes oil or grease spills. What can I do in the bathroom?  Use night lights.  Install grab bars by the toilet and in the tub and shower. Do not use towel bars as grab bars.  Use non-skid mats or decals in the tub or shower.  If you need to sit down in the shower, use a plastic, non-slip stool.  Keep the floor dry.  Clean up any water that spills on the floor as soon as it happens.  Remove soap buildup in the tub or shower regularly.  Attach bath mats securely with double-sided non-slip rug tape.  Do not have throw rugs and other things on the floor that can make you trip. What can I do in the bedroom?  Use night lights.  Make sure that you have a light by your bed that is easy to reach.  Do not use any sheets or blankets that are too big for your bed. They should not hang down onto the floor.  Have a firm chair that has side arms. You can use this for support while you get dressed.  Do not have throw rugs and other things on the floor that can make you trip. What can I do in the kitchen?  Clean up any spills right away.  Avoid walking on wet floors.  Keep items that you use a lot in easy-to-reach places.  If you need to reach something above you, use a strong step stool that has a grab bar.  Keep electrical cords out of the way.  Do not use floor polish or wax that makes floors slippery. If you must use  wax, use non-skid floor wax.  Do not have throw rugs and other things on the floor that can make you trip. What can I do with my stairs?  Do not leave any items on the stairs.  Make sure that there are handrails on both sides of the stairs and use them. Fix handrails that are broken or loose. Make sure that handrails are as long as the stairways.  Check any carpeting to make sure that it is firmly attached to the stairs. Fix any carpet that is loose or worn.  Avoid having throw rugs at the top or bottom of the stairs. If you do have throw rugs, attach them to the floor with carpet tape.  Make sure that you have a light switch at the top of the stairs and the bottom of the stairs. If you do not have them, ask someone to add them for you. What else can I do to help prevent falls?  Wear shoes that:  Do not have high heels.  Have rubber bottoms.  Are comfortable and fit you  well.  Are closed at the toe. Do not wear sandals.  If you use a stepladder:  Make sure that it is fully opened. Do not climb a closed stepladder.  Make sure that both sides of the stepladder are locked into place.  Ask someone to hold it for you, if possible.  Clearly mark and make sure that you can see:  Any grab bars or handrails.  First and last steps.  Where the edge of each step is.  Use tools that help you move around (mobility aids) if they are needed. These include:  Canes.  Walkers.  Scooters.  Crutches.  Turn on the lights when you go into a dark area. Replace any light bulbs as soon as they burn out.  Set up your furniture so you have a clear path. Avoid moving your furniture around.  If any of your floors are uneven, fix them.  If there are any pets around you, be aware of where they are.  Review your medicines with your doctor. Some medicines can make you feel dizzy. This can increase your chance of falling. Ask your doctor what other things that you can do to help prevent falls. This information is not intended to replace advice given to you by your health care provider. Make sure you discuss any questions you have with your health care provider. Document Released: 04/09/2009 Document Revised: 11/19/2015 Document Reviewed: 07/18/2014  2017 Elsevier  Health Maintenance, Male A healthy lifestyle and preventative care can promote health and wellness.  Maintain regular health, dental, and eye exams.  Eat a healthy diet. Foods like vegetables, fruits, whole grains, low-fat dairy products, and lean protein foods contain the nutrients you need and are low in calories. Decrease your intake of foods high in solid fats, added sugars, and salt. Get information about a proper diet from your health care provider, if necessary.  Regular physical exercise is one of the most important things you can do for your health. Most adults should get at least 150 minutes of  moderate-intensity exercise (any activity that increases your heart rate and causes you to sweat) each week. In addition, most adults need muscle-strengthening exercises on 2 or more days a week.   Maintain a healthy weight. The body mass index (BMI) is a screening tool to identify possible weight problems. It provides an estimate of body fat based on height and weight. Your health care provider  can find your BMI and can help you achieve or maintain a healthy weight. For males 20 years and older:  A BMI below 18.5 is considered underweight.  A BMI of 18.5 to 24.9 is normal.  A BMI of 25 to 29.9 is considered overweight.  A BMI of 30 and above is considered obese.  Maintain normal blood lipids and cholesterol by exercising and minimizing your intake of saturated fat. Eat a balanced diet with plenty of fruits and vegetables. Blood tests for lipids and cholesterol should begin at age 33 and be repeated every 5 years. If your lipid or cholesterol levels are high, you are over age 35, or you are at high risk for heart disease, you may need your cholesterol levels checked more frequently.Ongoing high lipid and cholesterol levels should be treated with medicines if diet and exercise are not working.  If you smoke, find out from your health care provider how to quit. If you do not use tobacco, do not start.  Lung cancer screening is recommended for adults aged 68-80 years who are at high risk for developing lung cancer because of a history of smoking. A yearly low-dose CT scan of the lungs is recommended for people who have at least a 30-pack-year history of smoking and are current smokers or have quit within the past 15 years. A pack year of smoking is smoking an average of 1 pack of cigarettes a day for 1 year (for example, a 30-pack-year history of smoking could mean smoking 1 pack a day for 30 years or 2 packs a day for 15 years). Yearly screening should continue until the smoker has stopped smoking  for at least 15 years. Yearly screening should be stopped for people who develop a health problem that would prevent them from having lung cancer treatment.  If you choose to drink alcohol, do not have more than 2 drinks per day. One drink is considered to be 12 oz (360 mL) of beer, 5 oz (150 mL) of wine, or 1.5 oz (45 mL) of liquor.  Avoid the use of street drugs. Do not share needles with anyone. Ask for help if you need support or instructions about stopping the use of drugs.  High blood pressure causes heart disease and increases the risk of stroke. High blood pressure is more likely to develop in:  People who have blood pressure in the end of the normal range (100-139/85-89 mm Hg).  People who are overweight or obese.  People who are African American.  If you are 64-37 years of age, have your blood pressure checked every 3-5 years. If you are 61 years of age or older, have your blood pressure checked every year. You should have your blood pressure measured twice-once when you are at a hospital or clinic, and once when you are not at a hospital or clinic. Record the average of the two measurements. To check your blood pressure when you are not at a hospital or clinic, you can use:  An automated blood pressure machine at a pharmacy.  A home blood pressure monitor.  If you are 65-82 years old, ask your health care provider if you should take aspirin to prevent heart disease.  Diabetes screening involves taking a blood sample to check your fasting blood sugar level. This should be done once every 3 years after age 75 if you are at a normal weight and without risk factors for diabetes. Testing should be considered at a younger age or be carried  out more frequently if you are overweight and have at least 1 risk factor for diabetes.  Colorectal cancer can be detected and often prevented. Most routine colorectal cancer screening begins at the age of 76 and continues through age 88. However, your  health care provider may recommend screening at an earlier age if you have risk factors for colon cancer. On a yearly basis, your health care provider may provide home test kits to check for hidden blood in the stool. A small camera at the end of a tube may be used to directly examine the colon (sigmoidoscopy or colonoscopy) to detect the earliest forms of colorectal cancer. Talk to your health care provider about this at age 39 when routine screening begins. A direct exam of the colon should be repeated every 5-10 years through age 68, unless early forms of precancerous polyps or small growths are found.  People who are at an increased risk for hepatitis B should be screened for this virus. You are considered at high risk for hepatitis B if:  You were born in a country where hepatitis B occurs often. Talk with your health care provider about which countries are considered high risk.  Your parents were born in a high-risk country and you have not received a shot to protect against hepatitis B (hepatitis B vaccine).  You have HIV or AIDS.  You use needles to inject street drugs.  You live with, or have sex with, someone who has hepatitis B.  You are a man who has sex with other men (MSM).  You get hemodialysis treatment.  You take certain medicines for conditions like cancer, organ transplantation, and autoimmune conditions.  Hepatitis C blood testing is recommended for all people born from [birth year] through 1967 and any individual with known risk factors for hepatitis C.  Healthy men should no longer receive prostate-specific antigen (PSA) blood tests as part of routine cancer screening. Talk to your health care provider about prostate cancer screening.  Testicular cancer screening is not recommended for adolescents or adult males who have no symptoms. Screening includes self-exam, a health care provider exam, and other screening tests. Consult with your health care provider about any symptoms you  have or any concerns you have about testicular cancer.  Practice safe sex. Use condoms and avoid high-risk sexual practices to reduce the spread of sexually transmitted infections (STIs).  You should be screened for STIs, including gonorrhea and chlamydia if:  You are sexually active and are younger than 24 years.  You are older than 24 years, and your health care provider tells you that you are at risk for this type of infection.  Your sexual activity has changed since you were last screened, and you are at an increased risk for chlamydia or gonorrhea. Ask your health care provider if you are at risk.  If you are at risk of being infected with HIV, it is recommended that you take a prescription medicine daily to prevent HIV infection. This is called pre-exposure prophylaxis (PrEP). You are considered at risk if:  You are a man who has sex with other men (MSM).  You are a heterosexual man who is sexually active with multiple partners.  You take drugs by injection.  You are sexually active with a partner who has HIV.  Talk with your health care provider about whether you are at high risk of being infected with HIV. If you choose to begin PrEP, you should first be tested for HIV. You should then  be tested every 3 months for as long as you are taking PrEP.  Use sunscreen. Apply sunscreen liberally and repeatedly throughout the day. You should seek shade when your shadow is shorter than you. Protect yourself by wearing long sleeves, pants, a wide-brimmed hat, and sunglasses year round whenever you are outdoors.  Tell your health care provider of new moles or changes in moles, especially if there is a change in shape or color. Also, tell your health care provider if a mole is larger than the size of a pencil eraser.  A one-time screening for abdominal aortic aneurysm (AAA) and surgical repair of large AAAs by ultrasound is recommended for men aged 37-75 years who are current or former  smokers.  Stay current with your vaccines (immunizations). This information is not intended to replace advice given to you by your health care provider. Make sure you discuss any questions you have with your health care provider. Document Released: 12/10/2007 Document Revised: 07/04/2014 Document Reviewed: 03/17/2015 Elsevier Interactive Patient Education  2017 [employer].

## 2016-06-17 NOTE — Progress Notes (Signed)
Subjective:   Juan Lin is a 75 y.o. male who presents for Medicare Annual/Subsequent preventive examination.  The Patient was informed that the wellness visit is to identify future health risk and educate and initiate measures that can reduce risk for increased disease through the lifespan.    NO ROS; Medicare Wellness Visit Next apt with Dr. Yong Channel 07/12/2016  Describes health as good, fair or great? Great   Preventive Screening -Counseling & Management  Prostate cancer in 76' radical prostatectomy; Dr. Gentry Fitz;  Dr. Yong Channel  Dr. Yong Channel following PSA   Colonoscopy 07/2014- no recall due to  age  Smoking history: never smoked   Second Hand Smoke status; No Smokers in the home  ETOH 7 standard drinks per week  / habit continues the same   RISK FACTORS Regular exercise Wants to join silver sneakers as Insurance covers  Has elliptical at home but dances as well   Diet BMI 21.9 mediterranean Breakfast; eggs, grits, waffles Lunch; skip lunch  Supper; goes out to eat;  Heat healthy  Cooks healthy; olive oil etc    Fall risk /hx of bursitis left knee / no  Mobility of Functional changes this year? No   Cardiac Risk Factors:  Advanced aged > 68 in men;  Hyperlipidemia- chol 179; trig 80 HDL 69 and LDL 93;  Diabetes -neg Family History ( no family hx; extended family had colon cancer)    Obesity BMI 21   Eye exam; has vision checks;  Beginning cataract in right;  Across form [employer] on [place];  About 6 months ago; no issues   Depression Screen/ no  PhQ 2: negative  Activities of Daily Living - See functional screen   Cognitive testing;  Ad8 score; 0 or less than 2  MMSE deferred or completed if AD8 + 2 issues  Advanced Directives yes; copy requested for chart   Patient Care Team: Marin Olp, MD as PCP - General (Family Medicine)   Immunization History  Administered Date(s) Administered  . Influenza Whole 03/28/1999  .  Influenza,inj,Quad PF,36+ Mos 04/03/2013, 03/19/2014  . Influenza-Unspecified 04/24/2015, 03/21/2016  . Pneumococcal Conjugate-13 06/02/2014  . Pneumococcal Polysaccharide-23 12/07/2006  . Td 12/07/2006  . Tdap 11/04/2015  . Zoster 02/23/2010   Required Immunizations needed today  Screening test up to date or reviewed for plan of completion There are no preventive care reminders to display for this patient.        Objective:    Vitals: BP 134/88   Pulse 86   Ht 6' 1.5" (1.867 m)   Wt 168 lb 2 oz (76.3 kg)   SpO2 95%   BMI 21.88 kg/m   Body mass index is 21.88 kg/m.  Tobacco History  Smoking Status  . Never Smoker  Smokeless Tobacco  . Never Used     Counseling given: Yes   Past Medical History:  Diagnosis Date  . Adenoma 2008   serrated adenoma of colon  . Diverticulosis   . GERD (gastroesophageal reflux disease)    H. Pylori related-gone when treated  . Hyperlipidemia   . Prostate cancer [facility]) 1994   radical prostatectomy Dr. Gentry Fitz  . Seasonal allergies   . Sinusitis acute    Past Surgical History:  Procedure Laterality Date  . APPENDECTOMY    . CHOLECYSTECTOMY  05/2009  . prostatectomy  1994   DR.[facility]HL[facility]  . TONSILLECTOMY     Family History  Problem Relation Age of Onset  . Prostate  cancer Paternal Uncle   . Prostate cancer Cousin   . Pneumonia Father     aspiration  . Other  46    smoker. sudden death unclear cause  . Colon cancer Neg Hx    History  Sexual Activity  . Sexual activity: Not on file    Outpatient Encounter Prescriptions as of 06/17/2016  Medication Sig  . cetirizine (ZYRTEC) 10 MG tablet Take 10 mg by mouth daily.  [name] glucosamine-chondroitin 500-400 MG tablet Take 1 tablet by mouth daily.    . Multiple Vitamins-Minerals (MULTIVITAMIN WITH MINERALS) tablet Take 1 tablet by mouth daily.    . naproxen sodium (ANAPROX) 220 MG tablet Takes 1-2 tablets as needed.  . Omega-3 Fatty Acids (FISH OIL) 500 MG CAPS Take 1  capsule by mouth daily.     No facility-administered encounter medications on file as of 06/17/2016.     Activities of Daily Living In your present state of health, do you have any difficulty performing the following activities: 06/17/2016  Hearing? N  Vision? N  Difficulty concentrating or making decisions? (No Data)  Walking or climbing stairs? N  Dressing or bathing? N  Doing errands, shopping? N  Preparing Food and eating ? N  Using the Toilet? N  In the past six months, have you accidently leaked urine? N  Do you have problems with loss of bowel control? N  Managing your Medications? N  Managing your Finances? N  Housekeeping or managing your Housekeeping? N  Some recent data might be hidden    Patient Care Team: Marin Olp, MD as PCP - General (Family Medicine)   Assessment:     Exercise Activities and Dietary recommendations    Goals    . Exercise 150 minutes per week (moderate activity)          Will do music and dance  Plays in several bands;       Fall Risk Fall Risk  06/17/2016 07/09/2015 04/08/2013  Falls in the past year? No No No   Depression Screen PHQ 2/9 Scores 06/17/2016 07/09/2015 04/08/2013  PHQ - 2 Score 0 0 0    Cognitive Function/ normal; Plays several instruments; travels and enjoys his life         Immunization History  Administered Date(s) Administered  . Influenza Whole 03/28/1999  . Influenza,inj,Quad PF,36+ Mos 04/03/2013, 03/19/2014  . Influenza-Unspecified 04/24/2015, 03/21/2016  . Pneumococcal Conjugate-13 06/02/2014  . Pneumococcal Polysaccharide-23 12/07/2006  . Td 12/07/2006  . Tdap 11/04/2015  . Zoster 02/23/2010   Screening Tests Health Maintenance  Topic Date Due  . TETANUS/TDAP  11/03/2025  . INFLUENZA VACCINE  Completed  . ZOSTAVAX  Completed  . 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 Concerned about PSA  but otherwise is in good health   During the course of the visit the patient was educated and counseled about the following appropriate screening and preventive services:   Vaccines to include Pneumoccal, Influenza, Hepatitis B, Td, Zostavax, HCV  Electrocardiogram  Cardiovascular Disease  Colorectal cancer screening aged out  Diabetes screening neg  Prostate Cancer Screening to repeat w next visit  Glaucoma screening neg  Nutrition counseling / adequate  Smoking cessation counseling /na  Patient Instructions (the written plan) was given to the patient.    Wynetta Fines, RN  06/17/2016

## 2016-06-17 NOTE — Progress Notes (Signed)
I have reviewed and agree with note, evaluation, plan. In regards to PSA- history prostate cancer- continue to follow with urology  Garret Reddish, MD

## 2016-06-27 HISTORY — PX: CATARACT EXTRACTION, BILATERAL: SHX1313

## 2016-06-27 LAB — PSA: PSA: 9.47

## 2016-07-05 ENCOUNTER — Other Ambulatory Visit (INDEPENDENT_AMBULATORY_CARE_PROVIDER_SITE_OTHER): Payer: PPO

## 2016-07-05 DIAGNOSIS — C61 Malignant neoplasm of prostate: Secondary | ICD-10-CM | POA: Diagnosis not present

## 2016-07-05 DIAGNOSIS — Z0000 Encounter for general adult medical examination without abnormal findings: Secondary | ICD-10-CM | POA: Diagnosis not present

## 2016-07-05 LAB — BASIC METABOLIC PANEL
BUN: 17 mg/dL (ref 6–23)
CHLORIDE: 99 meq/L (ref 96–112)
CO2: 29 meq/L (ref 19–32)
Calcium: 8.9 mg/dL (ref 8.4–10.5)
Creatinine, Ser: 1.08 mg/dL (ref 0.40–1.50)
GFR: 70.32 mL/min (ref >60.00)
Glucose, Bld: 85 mg/dL (ref 70–99)
POTASSIUM: 4.3 meq/L (ref 3.5–5.1)
SODIUM: 134 meq/L — AB (ref 135–145)

## 2016-07-05 LAB — POC URINALSYSI DIPSTICK (AUTOMATED)
BILIRUBIN UA: NEGATIVE
Glucose, UA: NEGATIVE
Ketones, UA: NEGATIVE
LEUKOCYTES UA: NEGATIVE
NITRITE UA: NEGATIVE
PH UA: 6.5
Protein, UA: NEGATIVE
RBC UA: NEGATIVE
Spec Grav, UA: 1.01
Urobilinogen, UA: 0.2

## 2016-07-05 LAB — CBC WITH DIFFERENTIAL/PLATELET
BASOS ABS: 0 10*3/uL (ref 0.0–0.1)
BASOS PCT: 0.4 % (ref 0.0–3.0)
Eosinophils Absolute: 0.1 10*3/uL (ref 0.0–0.7)
Eosinophils Relative: 1.6 % (ref 0.0–5.0)
HEMATOCRIT: 45.8 % (ref 39.0–52.0)
Hemoglobin: 16 g/dL (ref 13.0–17.0)
LYMPHS PCT: 28.5 % (ref 12.0–46.0)
Lymphs Abs: 2.1 10*3/uL (ref 0.7–4.0)
MCHC: 35 g/dL (ref 30.0–36.0)
MCV: 90.7 fl (ref 78.0–100.0)
MONOS PCT: 10.5 % (ref 3.0–12.0)
Monocytes Absolute: 0.8 10*3/uL (ref 0.1–1.0)
NEUTROS ABS: 4.3 10*3/uL (ref 1.4–7.7)
Neutrophils Relative %: 59 % (ref 43.0–77.0)
Platelets: 252 10*3/uL (ref 150.0–400.0)
RBC: 5.06 Mil/uL (ref 4.22–5.81)
RDW: 13.1 % (ref 11.5–15.5)
WBC: 7.4 10*3/uL (ref 4.0–10.5)

## 2016-07-05 LAB — LIPID PANEL
Cholesterol: 191 mg/dL (ref 0–200)
HDL: 65.4 mg/dL (ref >39.00)
LDL Cholesterol: 107 mg/dL — ABNORMAL HIGH (ref 0–99)
NONHDL: 125.95
Total CHOL/HDL Ratio: 3
Triglycerides: 95 mg/dL (ref 0.0–149.0)
VLDL: 19 mg/dL (ref 0.0–40.0)

## 2016-07-05 LAB — TSH: TSH: 5.07 u[IU]/mL — ABNORMAL HIGH (ref 0.35–4.50)

## 2016-07-05 LAB — HEPATIC FUNCTION PANEL
ALBUMIN: 4.1 g/dL (ref 3.5–5.2)
ALT: 19 U/L (ref 0–53)
AST: 21 U/L (ref 0–37)
Alkaline Phosphatase: 54 U/L (ref 39–117)
Bilirubin, Direct: 0.2 mg/dL (ref 0.0–0.3)
TOTAL PROTEIN: 7.1 g/dL (ref 6.0–8.3)
Total Bilirubin: 1.5 mg/dL — ABNORMAL HIGH (ref 0.2–1.2)

## 2016-07-05 LAB — PSA: PSA: 9.47 ng/mL — AB (ref 0.10–4.00)

## 2016-07-12 ENCOUNTER — Ambulatory Visit (INDEPENDENT_AMBULATORY_CARE_PROVIDER_SITE_OTHER): Payer: PPO | Admitting: Family Medicine

## 2016-07-12 ENCOUNTER — Encounter: Payer: Self-pay | Admitting: Family Medicine

## 2016-07-12 VITALS — BP 118/80 | HR 86 | Temp 97.8°F | Ht 73.25 in | Wt 167.4 lb

## 2016-07-12 DIAGNOSIS — Z0001 Encounter for general adult medical examination with abnormal findings: Secondary | ICD-10-CM | POA: Diagnosis not present

## 2016-07-12 DIAGNOSIS — C61 Malignant neoplasm of prostate: Secondary | ICD-10-CM

## 2016-07-12 NOTE — Progress Notes (Signed)
Pre visit review using our clinic review tool, if applicable. No additional management support is needed unless otherwise documented below in the visit note. 

## 2016-07-12 NOTE — Progress Notes (Signed)
Phone: [phone]  Subjective:  Patient presents today for their annual physical. Chief complaint-noted.   See problem oriented charting- ROS- full  review of systems was completed and negative including No chest pain or shortness of breath. No headache or blurry vision.    The following were reviewed and entered/updated in epic: Past Medical History:  Diagnosis Date  . Adenoma 2008   serrated adenoma of colon  . Diverticulosis   . GERD (gastroesophageal reflux disease)    H. Pylori related-gone when treated  . Hyperlipidemia   . Prostate cancer [facility]) 1994   radical prostatectomy Dr. Gentry Fitz  . Seasonal allergies   . Sinusitis acute    Patient Active Problem List   Diagnosis Date Noted  . Elevated TSH 06/02/2014    Priority: Medium  . Prostate cancer [facility])     Priority: Medium  . Hyperlipidemia 03/10/2009    Priority: Medium  . History of colon polyps     Priority: Low  . Basal cell cancer 04/08/2013    Priority: Low  . ALLERGIC RHINITIS 08/06/2007    Priority: Low  . Hemorrhagic prepatellar bursitis of left knee 09/16/2015   Past Surgical History:  Procedure Laterality Date  . APPENDECTOMY    . CHOLECYSTECTOMY  05/2009  . prostatectomy  1994   [facility]  . TONSILLECTOMY      Family History  Problem Relation Age of Onset  . Prostate cancer Paternal Uncle   . Prostate cancer Cousin   . Pneumonia Father     aspiration  . Other  46    smoker. sudden death unclear cause  . Colon cancer Neg Hx     Medications- reviewed and updated Current Outpatient Prescriptions  Medication Sig Dispense Refill  . cetirizine (ZYRTEC) 10 MG tablet Take 10 mg by mouth daily.    [name] glucosamine-chondroitin 500-400 MG tablet Take 1 tablet by mouth daily.      . Multiple Vitamins-Minerals (MULTIVITAMIN WITH MINERALS) tablet Take 1 tablet by mouth daily.      . naproxen sodium (ANAPROX) 220 MG tablet Takes 1-2 tablets as needed.    . Omega-3 Fatty Acids (FISH OIL) 500 MG  CAPS Take 1 capsule by mouth daily.       No current facility-administered medications for this visit.     Allergies-reviewed and updated No Known Allergies  Social History   Social History  . Marital status: Married    Spouse name: N/A  . Number of children: N/A  . Years of education: N/A   Social History Main Topics  . Smoking status: Never Smoker  . Smokeless tobacco: Never Used  . Alcohol use 4.2 oz/week    7 Standard drinks or equivalent per week  . Drug use:     Types: Amyl nitrate, IV  . Sexual activity: Not Asked   Other Topics Concern  . None   Social History Narrative   From [place]    Came down to [employer] for graduate studies- Phd in botany.    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       Married 1985.1st marriage. No kids. No pets.       Hobbies- above + gardening          Objective: BP 118/80   Pulse 86   Temp 97.8 F (36.6 C) (Oral)   Ht 6' 1.25" (1.861  m)   Wt 167 lb 6.4 oz (75.9 kg)   SpO2 96%   BMI 21.94 kg/m  Gen: NAD, resting comfortably, appears stated age HEENT: Mucous membranes are moist. Oropharynx normal Neck: no thyromegaly CV: RRR no murmurs rubs or gallops Lungs: CTAB no crackles, wheeze, rhonchi Abdomen: soft/nontender/nondistended/normal bowel sounds. No rebound or guarding.  Ext: no edema Skin: warm, dry Neuro: grossly normal, moves all extremities, PERRLA  Rectal exam: no prostate detected (patient specifically requested exam despite explanation of no prostate being present)  Assessment/Plan:  76 y.o. male presenting for annual physical.  Health Maintenance counseling: 1. Anticipatory guidance: Patient counseled regarding regular dental exams, eye exams, wearing seatbelts.  2. Risk factor reduction:  Advised patient of need for regular exercise and diet rich and fruits and vegetables to  reduce risk of heart attack and stroke. Still dancing, very physically active, also does ellipitcal and isometrics. Weight stbale.  3. Immunizations/screenings/ancillary studies Immunization History  Administered Date(s) Administered  . Influenza Whole 03/28/1999  . Influenza,inj,Quad PF,36+ Mos 04/03/2013, 03/19/2014  . Influenza-Unspecified 04/24/2015, 03/21/2016  . Pneumococcal Conjugate-13 06/02/2014  . Pneumococcal Polysaccharide-23 12/07/2006  . Td 12/07/2006  . Tdap 11/04/2015  . Zoster 02/23/2010  4. Prostate cancer -  radical prostatectomy. PSA continues to slowly rise with more significant increase this year. I thought he was checking in with urology previously but had not- will refer back to urology Lab Results  Component Value Date   PSA 9.47 (H) 07/05/2016   PSA 6.84 (H) 07/02/2015   PSA 6.02 (H) 06/02/2014   5. Colon cancer screening - 2016 last colonoscopy- no further colonoscopy due to age 12. Skin cancer screening- follows yearly with Dr. Ronnald Ramp  Status of chronic or acute concerns   Mild TSH elevation- asymptomatic, monitor only Lab Results  Component Value Date   TSH 5.07 (H) 07/05/2016   Lipids- mild elevation only, normal last year- to discuss with wife as she has been cooking slightly differently and she eats mainly foods she prepares. Exercise is excellent.   Orders Placed This Encounter  Procedures  . Ambulatory referral to Urology    Referral Priority:   Routine    Referral Type:   Consultation    Referral Reason:   Specialty Services Required    Requested Specialty:   Urology    Number of Visits Requested:   1   Return precautions advised.   Garret Reddish, MD

## 2016-07-12 NOTE — Patient Instructions (Addendum)
We will call you within a week or two about your referral to urology givne increase in PSA. If you do not hear within 3 weeks, give [place] a call.   No other changes   Labs other than PSA look great (other than mildly high cholesterol which I would not start meds for)

## 2016-11-04 DIAGNOSIS — C61 Malignant neoplasm of prostate: Secondary | ICD-10-CM | POA: Diagnosis not present

## 2016-11-04 LAB — PSA: PSA: 9.17

## 2016-11-10 ENCOUNTER — Encounter: Payer: Self-pay | Admitting: Family Medicine

## 2017-01-13 ENCOUNTER — Encounter: Payer: Self-pay | Admitting: Family Medicine

## 2017-01-14 ENCOUNTER — Encounter: Payer: Self-pay | Admitting: Family Medicine

## 2017-01-31 DIAGNOSIS — H2513 Age-related nuclear cataract, bilateral: Secondary | ICD-10-CM | POA: Diagnosis not present

## 2017-01-31 DIAGNOSIS — H5211 Myopia, right eye: Secondary | ICD-10-CM | POA: Diagnosis not present

## 2017-01-31 DIAGNOSIS — H52223 Regular astigmatism, bilateral: Secondary | ICD-10-CM | POA: Diagnosis not present

## 2017-02-21 DIAGNOSIS — H25812 Combined forms of age-related cataract, left eye: Secondary | ICD-10-CM | POA: Diagnosis not present

## 2017-02-21 DIAGNOSIS — H25811 Combined forms of age-related cataract, right eye: Secondary | ICD-10-CM | POA: Diagnosis not present

## 2017-03-02 DIAGNOSIS — C61 Malignant neoplasm of prostate: Secondary | ICD-10-CM | POA: Diagnosis not present

## 2017-03-08 ENCOUNTER — Encounter: Payer: Self-pay | Admitting: Family Medicine

## 2017-03-22 DIAGNOSIS — Z9841 Cataract extraction status, right eye: Secondary | ICD-10-CM | POA: Diagnosis not present

## 2017-03-22 DIAGNOSIS — H52221 Regular astigmatism, right eye: Secondary | ICD-10-CM | POA: Diagnosis not present

## 2017-03-22 DIAGNOSIS — H25811 Combined forms of age-related cataract, right eye: Secondary | ICD-10-CM | POA: Diagnosis not present

## 2017-03-22 DIAGNOSIS — H2511 Age-related nuclear cataract, right eye: Secondary | ICD-10-CM | POA: Diagnosis not present

## 2017-03-22 DIAGNOSIS — Z961 Presence of intraocular lens: Secondary | ICD-10-CM | POA: Diagnosis not present

## 2017-03-29 ENCOUNTER — Encounter: Payer: Self-pay | Admitting: Family Medicine

## 2017-03-29 ENCOUNTER — Other Ambulatory Visit: Payer: Self-pay | Admitting: Urology

## 2017-03-29 DIAGNOSIS — C61 Malignant neoplasm of prostate: Secondary | ICD-10-CM

## 2017-04-13 ENCOUNTER — Encounter (HOSPITAL_COMMUNITY)
Admission: RE | Admit: 2017-04-13 | Discharge: 2017-04-13 | Disposition: A | Discharge status: Home / Self Care | Payer: PPO | Source: Ambulatory Visit | Attending: Urology | Admitting: Urology

## 2017-04-13 DIAGNOSIS — C61 Malignant neoplasm of prostate: Secondary | ICD-10-CM | POA: Diagnosis not present

## 2017-04-13 MED ORDER — AXUMIN (FLUCICLOVINE F 18) INJECTION
10.0000 | Freq: Once | INTRAVENOUS | Status: AC
  Administered 2017-04-13: 10 via INTRAVENOUS

## 2017-07-03 DIAGNOSIS — H25812 Combined forms of age-related cataract, left eye: Secondary | ICD-10-CM | POA: Diagnosis not present

## 2017-07-06 DIAGNOSIS — H52223 Regular astigmatism, bilateral: Secondary | ICD-10-CM | POA: Diagnosis not present

## 2017-07-06 DIAGNOSIS — Z9842 Cataract extraction status, left eye: Secondary | ICD-10-CM | POA: Diagnosis not present

## 2017-07-06 DIAGNOSIS — H25812 Combined forms of age-related cataract, left eye: Secondary | ICD-10-CM | POA: Diagnosis not present

## 2017-07-06 DIAGNOSIS — Z9841 Cataract extraction status, right eye: Secondary | ICD-10-CM | POA: Diagnosis not present

## 2017-07-06 DIAGNOSIS — H2512 Age-related nuclear cataract, left eye: Secondary | ICD-10-CM | POA: Diagnosis not present

## 2017-07-06 DIAGNOSIS — Z961 Presence of intraocular lens: Secondary | ICD-10-CM | POA: Diagnosis not present

## 2017-08-15 DIAGNOSIS — C61 Malignant neoplasm of prostate: Secondary | ICD-10-CM | POA: Diagnosis not present

## 2017-08-15 DIAGNOSIS — R918 Other nonspecific abnormal finding of lung field: Secondary | ICD-10-CM | POA: Diagnosis not present

## 2017-08-15 LAB — PSA: PSA: 12.6

## 2017-09-06 DIAGNOSIS — C61 Malignant neoplasm of prostate: Secondary | ICD-10-CM | POA: Diagnosis not present

## 2017-09-11 ENCOUNTER — Encounter: Payer: Self-pay | Admitting: Family Medicine

## 2017-09-29 ENCOUNTER — Other Ambulatory Visit: Payer: PPO

## 2017-10-03 NOTE — Progress Notes (Signed)
Subjective:   Juan Lin is a 77 y.o. male who presents for Medicare Annual/Subsequent preventive examination.  Lives in two story home with wife. Mostly lives on first floor.   Patient currently caring for wife who had recent hip surgery.  Review of Systems:  No ROS.  Medicare Wellness Visit. Additional risk factors are reflected in the social history.  Sleeps 7-8 hrs/night. Wakes up feeling rested. Wakes up and stretches and does yoga moves. Occasional naps.   Cardiac Risk Factors include: advanced age (>89men, >44 women);male gender;dyslipidemia     Objective:    Vitals: BP 122/74 (BP Location: Right Arm, Patient Position: Sitting, Cuff Size: Normal)   Pulse 74   Resp 16   Ht 6\' 2"  (1.88 m)   Wt 166 lb (75.3 kg)   SpO2 95%   BMI 21.31 kg/m   Body mass index is 21.31 kg/m.  Advanced Directives 10/04/2017 06/17/2016 08/01/2014  Does Patient Have a Medical Advance Directive? Yes Yes Yes  Type of Advance Directive Living will;Healthcare Power of [place];Living will [place];Living will  Does patient want to make changes to medical advance directive? No - Patient declined - -  Copy of [place] in Chart? No - copy requested - No - copy requested    Tobacco Social History   Tobacco Use  Smoking Status Never Smoker  Smokeless Tobacco Never Used     Counseling given: Not Answered  Past Medical History:  Diagnosis Date  . Adenoma 2008   serrated adenoma of colon  . Diverticulosis   . GERD (gastroesophageal reflux disease)    H. Pylori related-gone when treated  . Hyperlipidemia   . Prostate cancer [facility]) 1994   radical prostatectomy Dr. Gentry Fitz  . Seasonal allergies   . Sinusitis acute    Past Surgical History:  Procedure Laterality Date  . APPENDECTOMY    . CATARACT EXTRACTION, BILATERAL Bilateral 2018  . CHOLECYSTECTOMY  05/2009  . prostatectomy  1994   DR[facility]  .  TONSILLECTOMY     Family History  Problem Relation Age of Onset  . Prostate cancer Paternal Uncle   . Prostate cancer Cousin   . Pneumonia Father        aspiration  . Other Unknown 46       smoker. sudden death unclear cause  . Colon cancer Neg Hx    Social History   Socioeconomic History  . Marital status: Married    Spouse name: Not on file  . Number of children: Not on file  . Years of education: Not on file  . Highest education level: Not on file  Occupational History    Comment: currently volunteers as musician  Social Needs  . Financial resource strain: Not on file  . Food insecurity:    Worry: Not on file    Inability: Not on file  . Transportation needs:    Medical: Not on file    Non-medical: Not on file  Tobacco Use  . Smoking status: Never Smoker  . Smokeless tobacco: Never Used  Substance and Sexual Activity  . Alcohol use: Yes    Alcohol/week: 4.2 oz    Types: 7 Standard drinks or equivalent per week  . Drug use: Yes    Types: Amyl nitrate, IV  . Sexual activity: Not on file  Lifestyle  . Physical activity:    Days per week: Not on file    Minutes per session:  Not on file  . Stress: Not on file  Relationships  . Social connections:    Talks on phone: Not on file    Gets together: Not on file    Attends religious service: Not on file    Active member of club or organization: Not on file    Attends meetings of clubs or organizations: Not on file    Relationship status: Not on file  Other Topics Concern  . Not on file  Social History Narrative   [place]    Came down to [employer] for graduate studies- Phd in botany.    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       Married 1985.1st marriage. No kids. No pets.       Hobbies- above + gardening       Outpatient Encounter Medications as of 10/04/2017  Medication Sig    . cetirizine (ZYRTEC) 10 MG tablet Take 10 mg by mouth daily.  [name] dextromethorphan-guaiFENesin (MUCINEX DM) 30-600 MG 12hr tablet Take 1 tablet by mouth 2 (two) times daily as needed for cough.  [name] glucosamine-chondroitin 500-400 MG tablet Take 1 tablet by mouth daily.    . Multiple Vitamins-Minerals (MULTIVITAMIN WITH MINERALS) tablet Take 1 tablet by mouth daily.    . naproxen sodium (ANAPROX) 220 MG tablet Takes 1-2 tablets as needed.  . Omega-3 Fatty Acids (FISH OIL) 500 MG CAPS Take 1 capsule by mouth daily.     No facility-administered encounter medications on file as of 10/04/2017.     Activities of Daily Living In your present state of health, do you have any difficulty performing the following activities: 10/04/2017  Hearing? N  Vision? N  Difficulty concentrating or making decisions? N  Walking or climbing stairs? N  Dressing or bathing? N  Doing errands, shopping? N  Preparing Food and eating ? N  Using the Toilet? N  In the past six months, have you accidently leaked urine? N  Do you have problems with loss of bowel control? N  Managing your Medications? N  Managing your Finances? N  Housekeeping or managing your Housekeeping? N  Some recent data might be hidden    Patient Care Team: Marin Olp, MD as PCP - General (Family Medicine) [facility], [facility] as Referring Physician (Ophthalmology) Teodoro Spray, [place] as Referring Physician (Optometry)   Assessment:   This is a routine wellness examination for Juan Lin.  Exercise Activities and Dietary recommendations Current Exercise Habits: Home exercise routine, Type of exercise: strength training/weights;treadmill, Time (Minutes): 30, Frequency (Times/Week): 3, Weekly Exercise (Minutes/Week): 90, Intensity: Mild, Exercise limited by: None identified Currently exercising at home on treadmill and doing stretches and weights. Patient hopes to get back to the [facility] once wife recovers from surgery.  Drinks water  throughout day. Drinks 2 cups coffee in the morning. Eats breakfast and dinner. Meals are prepared at home. States they eat very health, mostly vegetables and occasional meat. Snacks on biscuits, banana bread or cookies.   Goals    . Maintain current health status        Fall Risk Fall Risk  10/04/2017 06/17/2016 07/09/2015 04/08/2013  Falls in the past year? No No No No   Depression Screen PHQ 2/9 Scores 10/04/2017 06/17/2016 07/09/2015 04/08/2013  PHQ - 2 Score 0 0 0 0  PHQ- 9 Score 0 - - -  PHQ9 score is 0. No signs or symptoms of depression. 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 Total time spend on topic was 10 minutes.  Cognitive Function MMSE - Mini Mental State Exam 06/17/2016  Not completed: (No Data)   Ad8 score reviewed for issues:  Issues making decisions:no  Less interest in hobbies / activities:no  Repeats questions, stories (family complaining): no  Trouble using ordinary gadgets (microwave, computer, phone):no  Forgets the month or year: no  Mismanaging finances: no  Remembering appts:no  Daily problems with thinking and/or memory:no Ad8 score is=0 States he completes puzzles in newspaper and reads.       Immunization History  Administered Date(s) Administered  . Hepatitis A, Adult 12/16/1993, 07/28/1994  . Influenza Whole 03/28/1999  . Influenza,inj,Quad PF,6+ Mos 04/03/2013, 03/19/2014  . Influenza-Unspecified 04/24/2015, 03/21/2016, 03/21/2017  . Pneumococcal Conjugate-13 06/02/2014  . Pneumococcal Polysaccharide-23 12/07/2006  . Td 12/07/2006  . Tdap 11/04/2015  . Typhoid Inactivated 12/16/1993  . Yellow Fever 12/16/1993  . Zoster 02/23/2010   Screening Tests Health Maintenance  Topic Date Due  . INFLUENZA VACCINE  01/25/2018  . TETANUS/TDAP  11/03/2025  . PNA vac Low Risk Adult  Completed       Plan:   Follow up with PCP as directed.  I have personally reviewed and noted the following in the  patient's chart:   . Medical and social history . Use of alcohol, tobacco or illicit drugs  . Current medications and supplements . Functional ability and status . Nutritional status . Physical activity . Advanced directives . List of other physicians . Vitals . Screenings to include cognitive, depression, and falls . Referrals and appointments  In addition, I have reviewed and discussed with patient certain preventive protocols, quality metrics, and best practice recommendations. A written personalized care plan for preventive services as well as general preventive health recommendations were provided to patient.     Williemae Area, RN  10/04/2017

## 2017-10-03 NOTE — Progress Notes (Signed)
PCP notes:   Health maintenance: Up to date.   Abnormal screenings: None.    Patient concerns: Hearing, patient believes he is ready for hearing aids. Patient also requests for Dr Yong Channel to look at his left facial cheek skin concern. Coughing x 2 weeks, occasional white and clear phlegm.    Nurse concerns: None.   Next PCP appt: 10/06/17. AWV and CPE scheduled for 2020.

## 2017-10-04 ENCOUNTER — Ambulatory Visit (INDEPENDENT_AMBULATORY_CARE_PROVIDER_SITE_OTHER): Payer: PPO | Admitting: *Deleted

## 2017-10-04 ENCOUNTER — Encounter: Payer: Self-pay | Admitting: *Deleted

## 2017-10-04 VITALS — BP 122/74 | HR 74 | Resp 16 | Ht 74.0 in | Wt 166.0 lb

## 2017-10-04 DIAGNOSIS — Z0000 Encounter for general adult medical examination without abnormal findings: Secondary | ICD-10-CM | POA: Diagnosis not present

## 2017-10-04 NOTE — Progress Notes (Signed)
I have reviewed and agree with note, evaluation, plan.   I look forward to seeing him in a few days.  We can look at the facial lesion together as well as evaluate his cough.  Cassie-what is the disposition on the hearing aids?  Does he need any referrals or help from me on this?  Garret Reddish, MD

## 2017-10-04 NOTE — Progress Notes (Signed)
Dr Yong Channel, yes he would like to discuss hearing aids with you and possible need for referral.

## 2017-10-04 NOTE — Patient Instructions (Addendum)
Juan Lin ,  Bring a copy of your living will and/or healthcare power of attorney to your next office visit.  Thank you for taking time to come for your Medicare Wellness Visit. I appreciate your ongoing commitment to your health goals. Please review the following plan we discussed and let me know if I can assist you in the future.   These are the goals we discussed: Goals    . Maintain current health status        This is a list of the screening recommended for you and due dates:  Health Maintenance  Topic Date Due  . Flu Shot  01/25/2018  . Tetanus Vaccine  11/03/2025  . Pneumonia vaccines  Completed   Preventive Care for Adults  A healthy lifestyle and preventive care can promote health and wellness. Preventive health guidelines for adults include the following key practices.  . A routine yearly physical is a good way to check with your health care provider about your health and preventive screening. It is a chance to share any concerns and updates on your health and to receive a thorough exam.  . Visit your dentist for a routine exam and preventive care every 6 months. Brush your teeth twice a day and floss once a day. Good oral hygiene prevents tooth decay and gum disease.  . The frequency of eye exams is based on your age, health, family medical history, use  of contact lenses, and other factors. Follow your health care provider's recommendations for frequency of eye exams.  . Eat a healthy diet. Foods like vegetables, fruits, whole grains, low-fat dairy products, and lean protein foods contain the nutrients you need without too many calories. Decrease your intake of foods high in solid fats, added sugars, and salt. Eat the right amount of calories for you. Get information about a proper diet from your health care provider, if necessary.  . Regular physical exercise is one of the most important things you can do for your health. Most adults should get at least 150 minutes of  moderate-intensity exercise (any activity that increases your heart rate and causes you to sweat) each week. In addition, most adults need muscle-strengthening exercises on 2 or more days a week.  Silver Sneakers may be a benefit available to you. To determine eligibility, you may visit the website: www.silversneakers.com or contact program at (650) 205-1993 Mon-Fri between 8AM-8PM.   . Maintain a healthy weight. The body mass index (BMI) is a screening tool to identify possible weight problems. It provides an estimate of body fat based on height and weight. Your health care provider can find your BMI and can help you achieve or maintain a healthy weight.   For adults 20 years and older: ? A BMI below 18.5 is considered underweight. ? A BMI of 18.5 to 24.9 is normal. ? A BMI of 25 to 29.9 is considered overweight. ? A BMI of 30 and above is considered obese.   . Maintain normal blood lipids and cholesterol levels by exercising and minimizing your intake of saturated fat. Eat a balanced diet with plenty of fruit and vegetables. Blood tests for lipids and cholesterol should begin at age 67 and be repeated every 5 years. If your lipid or cholesterol levels are high, you are over 50, or you are at high risk for heart disease, you may need your cholesterol levels checked more frequently. Ongoing high lipid and cholesterol levels should be treated with medicines if diet and exercise are not  working.  . If you smoke, find out from your health care provider how to quit. If you do not use tobacco, please do not start.  . If you choose to drink alcohol, please do not consume more than 2 drinks per day. One drink is considered to be 12 ounces (355 mL) of beer, 5 ounces (148 mL) of wine, or 1.5 ounces (44 mL) of liquor.  . If you are 68-63 years old, ask your health care provider if you should take aspirin to prevent strokes.  . Use sunscreen. Apply sunscreen liberally and repeatedly throughout the day. You  should seek shade when your shadow is shorter than you. Protect yourself by wearing long sleeves, pants, a wide-brimmed hat, and sunglasses year round, whenever you are outdoors.  . Once a month, do a whole body skin exam, using a mirror to look at the skin on your back. Tell your health care provider of new moles, moles that have irregular borders, moles that are larger than a pencil eraser, or moles that have changed in shape or color.

## 2017-10-06 ENCOUNTER — Encounter: Payer: PPO | Admitting: Family Medicine

## 2017-11-07 DIAGNOSIS — L821 Other seborrheic keratosis: Secondary | ICD-10-CM | POA: Diagnosis not present

## 2017-11-07 DIAGNOSIS — Z85828 Personal history of other malignant neoplasm of skin: Secondary | ICD-10-CM | POA: Diagnosis not present

## 2017-11-07 DIAGNOSIS — C4441 Basal cell carcinoma of skin of scalp and neck: Secondary | ICD-10-CM | POA: Diagnosis not present

## 2017-11-07 DIAGNOSIS — D1801 Hemangioma of skin and subcutaneous tissue: Secondary | ICD-10-CM | POA: Diagnosis not present

## 2017-11-07 DIAGNOSIS — L570 Actinic keratosis: Secondary | ICD-10-CM | POA: Diagnosis not present

## 2017-11-07 DIAGNOSIS — L723 Sebaceous cyst: Secondary | ICD-10-CM | POA: Diagnosis not present

## 2017-11-07 DIAGNOSIS — C44329 Squamous cell carcinoma of skin of other parts of face: Secondary | ICD-10-CM | POA: Diagnosis not present

## 2017-11-07 DIAGNOSIS — D485 Neoplasm of uncertain behavior of skin: Secondary | ICD-10-CM | POA: Diagnosis not present

## 2017-11-07 DIAGNOSIS — D225 Melanocytic nevi of trunk: Secondary | ICD-10-CM | POA: Diagnosis not present

## 2017-12-08 ENCOUNTER — Ambulatory Visit (INDEPENDENT_AMBULATORY_CARE_PROVIDER_SITE_OTHER): Payer: PPO | Admitting: Family Medicine

## 2017-12-08 ENCOUNTER — Encounter

## 2017-12-08 ENCOUNTER — Encounter: Payer: Self-pay | Admitting: Family Medicine

## 2017-12-08 VITALS — BP 132/82 | HR 65 | Temp 97.9°F | Ht 74.0 in | Wt 159.8 lb

## 2017-12-08 DIAGNOSIS — E785 Hyperlipidemia, unspecified: Secondary | ICD-10-CM

## 2017-12-08 DIAGNOSIS — C61 Malignant neoplasm of prostate: Secondary | ICD-10-CM

## 2017-12-08 DIAGNOSIS — H9190 Unspecified hearing loss, unspecified ear: Secondary | ICD-10-CM | POA: Insufficient documentation

## 2017-12-08 DIAGNOSIS — I8392 Asymptomatic varicose veins of left lower extremity: Secondary | ICD-10-CM

## 2017-12-08 DIAGNOSIS — R7989 Other specified abnormal findings of blood chemistry: Secondary | ICD-10-CM | POA: Diagnosis not present

## 2017-12-08 DIAGNOSIS — Z0000 Encounter for general adult medical examination without abnormal findings: Secondary | ICD-10-CM | POA: Diagnosis not present

## 2017-12-08 DIAGNOSIS — I8390 Asymptomatic varicose veins of unspecified lower extremity: Secondary | ICD-10-CM | POA: Insufficient documentation

## 2017-12-08 DIAGNOSIS — H9193 Unspecified hearing loss, bilateral: Secondary | ICD-10-CM

## 2017-12-08 LAB — LIPID PANEL
CHOLESTEROL: 166 mg/dL (ref 0–200)
HDL: 72.3 mg/dL (ref >39.00)
LDL Cholesterol: 83 mg/dL (ref 0–99)
NonHDL: 94.02
TRIGLYCERIDES: 57 mg/dL (ref 0.0–149.0)
Total CHOL/HDL Ratio: 2
VLDL: 11.4 mg/dL (ref 0.0–40.0)

## 2017-12-08 LAB — COMPREHENSIVE METABOLIC PANEL
ALBUMIN: 4.2 g/dL (ref 3.5–5.2)
ALK PHOS: 46 U/L (ref 39–117)
ALT: 14 U/L (ref 0–53)
AST: 24 U/L (ref 0–37)
BUN: 13 mg/dL (ref 6–23)
CALCIUM: 9 mg/dL (ref 8.4–10.5)
CO2: 28 mEq/L (ref 19–32)
CREATININE: 0.94 mg/dL (ref 0.40–1.50)
Chloride: 97 mEq/L (ref 96–112)
GFR: 82.23 mL/min (ref >60.00)
Glucose, Bld: 85 mg/dL (ref 70–99)
Potassium: 4.4 mEq/L (ref 3.5–5.1)
SODIUM: 132 meq/L — AB (ref 135–145)
TOTAL PROTEIN: 6.9 g/dL (ref 6.0–8.3)
Total Bilirubin: 1.6 mg/dL — ABNORMAL HIGH (ref 0.2–1.2)

## 2017-12-08 LAB — CBC
HEMATOCRIT: 42.4 % (ref 39.0–52.0)
Hemoglobin: 14.5 g/dL (ref 13.0–17.0)
MCHC: 34.2 g/dL (ref 30.0–36.0)
MCV: 90.7 fl (ref 78.0–100.0)
PLATELETS: 213 10*3/uL (ref 150.0–400.0)
RBC: 4.68 Mil/uL (ref 4.22–5.81)
RDW: 13.6 % (ref 11.5–15.5)
WBC: 6.1 10*3/uL (ref 4.0–10.5)

## 2017-12-08 LAB — PSA: PSA: 12.69 ng/mL — AB (ref 0.10–4.00)

## 2017-12-08 LAB — TSH: TSH: 4.37 u[IU]/mL (ref 0.35–4.50)

## 2017-12-08 NOTE — Assessment & Plan Note (Signed)
Tested by [facility]- he wants referral to local audiologist who can help him get covered hearing aids under health team advantage plan

## 2017-12-08 NOTE — Assessment & Plan Note (Signed)
Update PSA today. Following with Dr. Junious Silk

## 2017-12-08 NOTE — Progress Notes (Signed)
Phone: [phone]  Subjective:  Patient presents today for their annual physical. Chief complaint-noted.   See problem oriented charting- ROS- full  review of systems was completed and negative except for: allergies, joint swelling, seasonal allergies  The following were reviewed and entered/updated in epic: Past Medical History:  Diagnosis Date  . Adenoma 2008   serrated adenoma of colon  . Diverticulosis   . GERD (gastroesophageal reflux disease)    H. Pylori related-gone when treated  . Hyperlipidemia   . Prostate cancer [facility]) 1994   radical prostatectomy Dr. Gentry Fitz  . Seasonal allergies   . Sinusitis acute    Patient Active Problem List   Diagnosis Date Noted  . Elevated TSH 06/02/2014    Priority: Medium  . Prostate cancer [facility])     Priority: Medium  . Hyperlipidemia 03/10/2009    Priority: Medium  . History of colon polyps     Priority: Low  . Basal cell cancer 04/08/2013    Priority: Low  . ALLERGIC RHINITIS 08/06/2007    Priority: Low  . Varicose vein of leg 12/08/2017  . Hearing loss 12/08/2017  . Hemorrhagic prepatellar bursitis of left knee 09/16/2015   Past Surgical History:  Procedure Laterality Date  . APPENDECTOMY    . CATARACT EXTRACTION, BILATERAL Bilateral 2018  . CHOLECYSTECTOMY  05/2009  . prostatectomy  1994   DR.MOHLER [facility]  . TONSILLECTOMY     Family History  Problem Relation Age of Onset  . Prostate cancer Paternal Uncle   . Prostate cancer Cousin   . Pneumonia Father        aspiration  . Other Unknown 46       smoker. sudden death unclear cause  . Colon cancer Neg Hx    Medications- reviewed and updated Current Outpatient Medications  Medication Sig Dispense Refill  . cetirizine (ZYRTEC) 10 MG tablet Take 10 mg by mouth daily.     No current facility-administered medications for this visit.     Allergies-reviewed and updated No Known Allergies  Social History   Social History Narrative   [place]    Came down to  [employer] for graduate studies- Phd in botany.    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       Married 1985.1st marriage. No kids. No pets.       Hobbies- above + gardening       Objective: BP 132/82 (BP Location: Left Arm, Patient Position: Sitting, Cuff Size: Large)   Pulse 65   Temp 97.9 F (36.6 C) (Oral)   Ht 6\' 2"  (1.88 m)   Wt 159 lb 12.8 oz (72.5 kg)   SpO2 96%   BMI 20.52 kg/m  Gen: NAD, resting comfortably HEENT: Mucous membranes are moist. Oropharynx normal Neck: no thyromegaly CV: RRR no murmurs rubs or gallops Lungs: CTAB no crackles, wheeze, rhonchi Abdomen: soft/nontender/nondistended/normal bowel sounds. No rebound or guarding.  Ext: no edema, significant varicose vein left leg, some smaller varicosities right leg Skin: warm, dry Neuro: grossly normal, moves all extremities, PERRLA  Assessment/Plan:  77 y.o. male presenting for annual physical.  Health Maintenance counseling: 1. Anticipatory guidance: Patient counseled regarding regular dental exams -q6 months, eye exams - yearly at least- cataract surgery in last few years. yearly, wearing seatbelts.  2. Risk factor reduction:  Advised patient of need for regular exercise and diet  rich and fruits and vegetables to reduce risk of heart attack and stroke. Exercise- Still dancing, very physically active, also does ellipitcal and isometrics. Diet- weight down 8 lbs from last CPE. Discussed not losing any more weight- he states had been heavy on exercise Wt Readings from Last 3 Encounters:  12/08/17 159 lb 12.8 oz (72.5 kg)  10/04/17 166 lb (75.3 kg)  07/12/16 167 lb 6.4 oz (75.9 kg)  3. Immunizations/screenings/ancillary studies- - We discussed shingrix availability issues  as well as coverage issues (part D medicare)- I recommended that patient get vaccine at the pharmacy    Immunization History  Administered Date(s) Administered  . Hepatitis A, Adult 12/16/1993, 07/28/1994  . Influenza Whole 03/28/1999  . Influenza,inj,Quad PF,6+ Mos 04/03/2013, 03/19/2014  . Influenza-Unspecified 04/24/2015, 03/21/2016, 03/21/2017  . Pneumococcal Conjugate-13 06/02/2014  . Pneumococcal Polysaccharide-23 12/07/2006  . Td 12/07/2006  . Tdap 11/04/2015  . Typhoid Inactivated 12/16/1993  . Yellow Fever 12/16/1993  . Zoster 02/23/2010  4. Prostate cancer screening- radical prostatectomy- PSA was trending up last year and referred back to urology.  We will get another PSA for him today- he has declined treatment suggested by urology unless he is symptomatic. He is seeing Dr. Junious Silk Lab Results  Component Value Date   PSA 12.60 08/15/2017   PSA 9.17 11/04/2016   PSA 9.47 (H) 07/05/2016   5. Colon cancer screening - no further colonoscopy due to age- last 2016 6. Skin cancer screening- sees Dr. Ronnald Ramp yearly- had removal of SCC and BCC and treatment of several actinic keratosis. advised regular sunscreen use. Denies worrisome, changing, or new skin lesions.   Status of chronic or acute concerns   May have some OA in hands.   Hyperlipidemia Lipids- mild elevation last year- repeat again at this time-his wife had been cooking differently last year so he was going to discuss with her. 30% 10 year risk last year- discussed how guidelines have changed and advise statin even up to age 14.  Lab Results  Component Value Date   CHOL 191 07/05/2016   HDL 65.40 07/05/2016   LDLCALC 107 (H) 07/05/2016   TRIG 95.0 07/05/2016   CHOLHDL 3 07/05/2016     Prostate cancer Update PSA today. Following with Dr. Junious Silk  Elevated TSH Lab Results  Component Value Date   TSH 5.07 (H) 07/05/2016  mild TSH elevation but no symptoms- monitor yearly- treat if symptomatic or above 10  Varicose vein of leg Advised compression stockings- wrote rx for [facility] [employer]  Hearing  loss Tested by [facility]- he wants referral to local audiologist who can help him get covered hearing aids under health team advantage plan   Future Appointments  Date Time Provider [place]  10/10/2018  2:00 PM Williemae Area, RN [facility]   Return in about 1 year (around 12/09/2018) for physical.  Lab/Order associations: Preventative health care  Hyperlipidemia, unspecified hyperlipidemia type - Plan: CBC, Comprehensive metabolic panel, Lipid panel  Prostate cancer ([place]) - Plan: PSA  Elevated TSH - Plan: TSH  Varicose veins of left lower extremity, unspecified whether complicated  Bilateral hearing loss, unspecified hearing loss type - Plan: Ambulatory referral to Audiology  Return precautions advised.  Garret Reddish, MD

## 2017-12-08 NOTE — Assessment & Plan Note (Signed)
Advised compression stockings- wrote rx for [facility] [employer] [facility]

## 2017-12-08 NOTE — Assessment & Plan Note (Signed)
Lab Results  Component Value Date   TSH 5.07 (H) 07/05/2016  mild TSH elevation but no symptoms- monitor yearly- treat if symptomatic or above 10

## 2017-12-08 NOTE — Patient Instructions (Addendum)
Please check with your pharmacy to see if they have the shingrix vaccine. If they do- please get this immunization and update [place] by phone call or mychart with dates you receive the vaccine  We will call you within two weeks about your referral to audiology. If you do not hear within 3 weeks, give [place] a call.   Try compression stockings through [employer] 20-30 mmhg- if causes significant discomfort may stop but I suspect it will help this not become progressive (though once socks are off the vein will refill)- try to wear through day and elevate at night or when you can  Please stop by lab before you go  Would also love if you could stay for a wellness visit with our nurse specialist before you leave today. This is a free benefit under medicare that may help [place] find additional ways to help you. Some highlights are reviewing medications, lifestyle, and doing a dementia screen.

## 2017-12-08 NOTE — Assessment & Plan Note (Signed)
Lipids- mild elevation last year- repeat again at this time-his wife had been cooking differently last year so he was going to discuss with her. 30% 10 year risk last year- discussed how guidelines have changed and advise statin even up to age 79.  Lab Results  Component Value Date   CHOL 191 07/05/2016   HDL 65.40 07/05/2016   LDLCALC 107 (H) 07/05/2016   TRIG 95.0 07/05/2016   CHOLHDL 3 07/05/2016

## 2017-12-09 NOTE — Progress Notes (Signed)
Your PSA was stable from 2-3 months ago at 12.69. Team please forward this to urology.  Your CBC was normal (blood counts, infection fighting cells, platelets). Your CMET was normal (kidney, liver, and electrolytes, blood sugar) other than slightly low sodium wnd slightly high bilirubin- both being your baseline- no major concners.  Your cholesterol looks better than last check- lets have you stay off medicine and continue healthy lifestyle.  Your thyroid was normal.

## 2017-12-11 MED FILL — SHINGRIX 50 MCG SUS: 50 | 1 days supply | Qty: 1 | Fill #0

## 2018-01-10 ENCOUNTER — Encounter: Payer: Self-pay | Admitting: Family Medicine

## 2018-02-14 MED FILL — SHINGRIX 50 MCG SUS: 50 | 1 days supply | Qty: 1 | Fill #1

## 2018-02-23 DIAGNOSIS — H903 Sensorineural hearing loss, bilateral: Secondary | ICD-10-CM | POA: Diagnosis not present

## 2018-03-06 DIAGNOSIS — H903 Sensorineural hearing loss, bilateral: Secondary | ICD-10-CM | POA: Diagnosis not present

## 2018-04-04 ENCOUNTER — Encounter: Payer: Self-pay | Admitting: Family Medicine

## 2018-05-26 IMAGING — CT NM PET NOPR SKULL BASE TO THIGH
7 series · 25 of 25 positions shown · non-contrast
Comparison: Fahrenheit 18 sodium fluoride PET scan.

CLINICAL DATA: Prostate carcinoma with biochemical recurrence.
Rising PSA equal

EXAM:
NUCLEAR MEDICINE PET SKULL BASE TO THIGH
TECHNIQUE: 10.0 mCi F-18 Fluciclovine was injected intravenously. Full-ring PET
imaging was performed from the skull base to thigh after the
radiotracer. CT data was obtained and used for attenuation
correction and anatomic localization.

[Series 3: pet sk_thigh ac · axial · 5.0mm · 4.07mm/px · z∈[+312,+1264]mm · 6 of 239 slices shown]
[im 1/239]
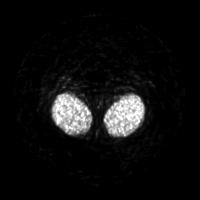
[im 48/239]
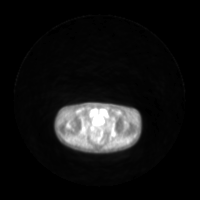
[im 96/239]
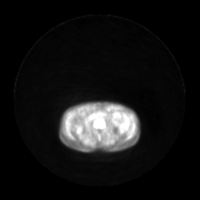
[im 143/239]
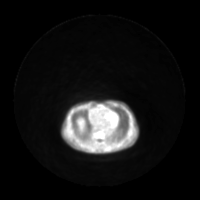
[im 191/239]
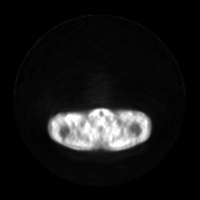
[im 239/239]
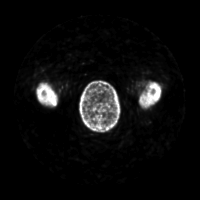

[Series 4: ct sk_thigh 5.0 b31f · axial · 5.0mm · 0.98mm/px · z∈[+312,+1264]mm · 5 of 239 slices shown]
[im 1/239  brain]
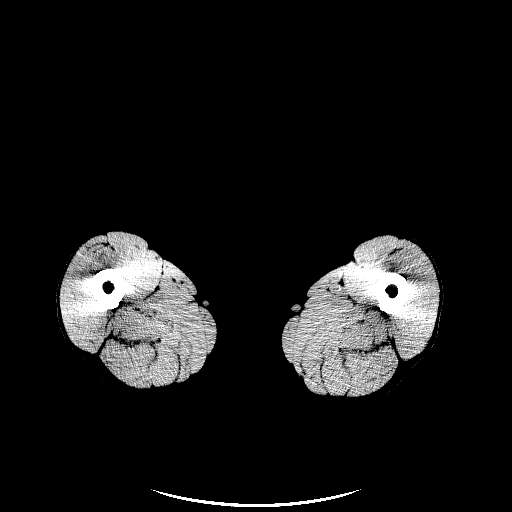
[im 60/239  brain]
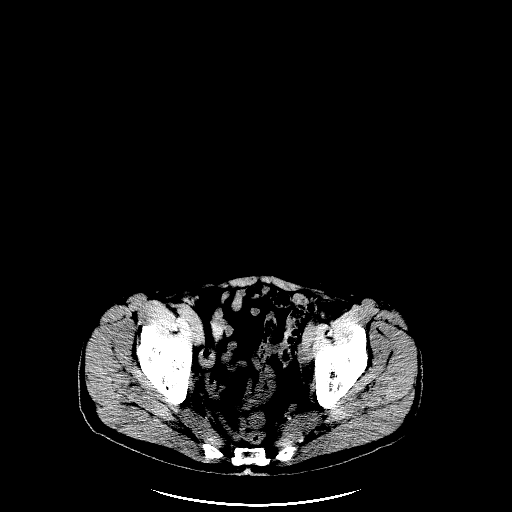
[im 120/239  brain]
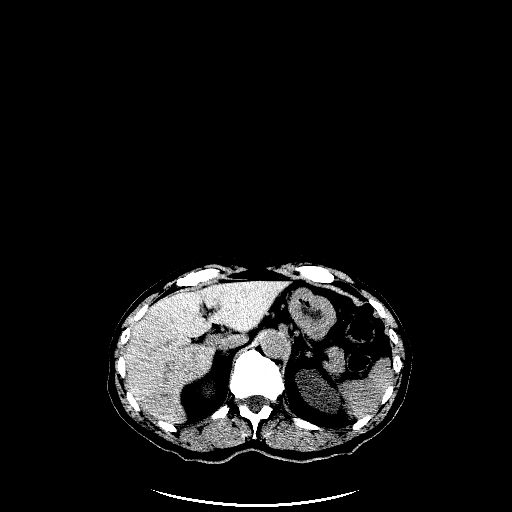
[im 179/239]
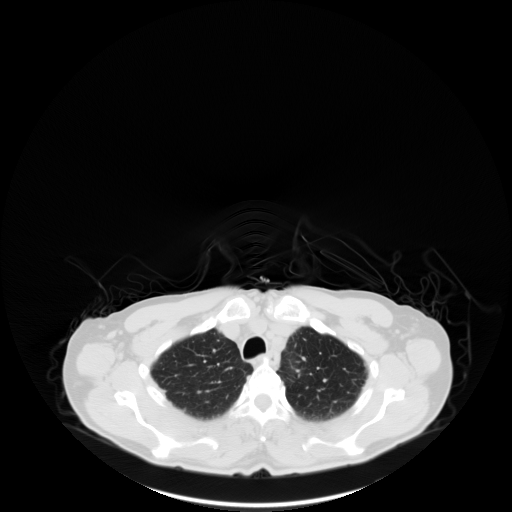
[im 239/239  brain]
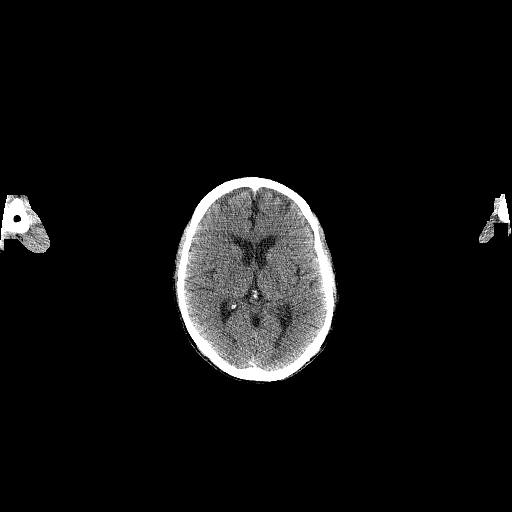

[Series 5: pet sk_thigh nac · axial · 5.0mm · 4.07mm/px · z∈[+312,+1264]mm · 5 of 239 slices shown]
[im 1/239]
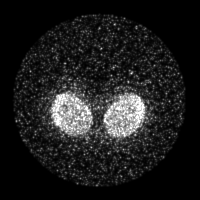
[im 60/239]
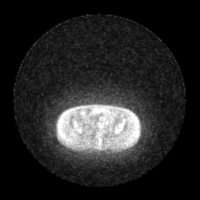
[im 120/239]
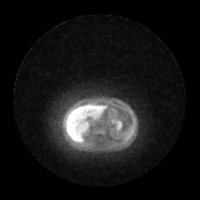
[im 179/239]
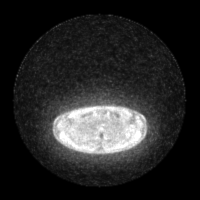
[im 239/239]
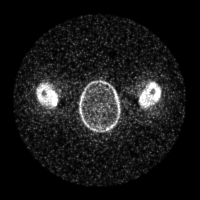

[Series 8: ct sk_thigh 5.0 b70f (id)_bone · axial · 5.0mm · 0.72mm/px · z∈[+802,+1102]mm · 2 of 76 slices shown]
[im 1/76  bone]
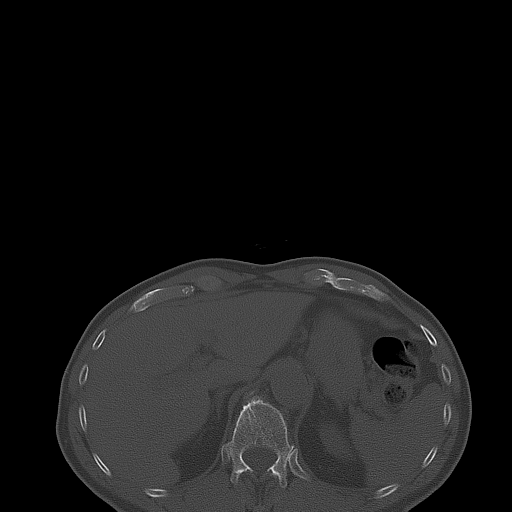
[im 76/76  bone]
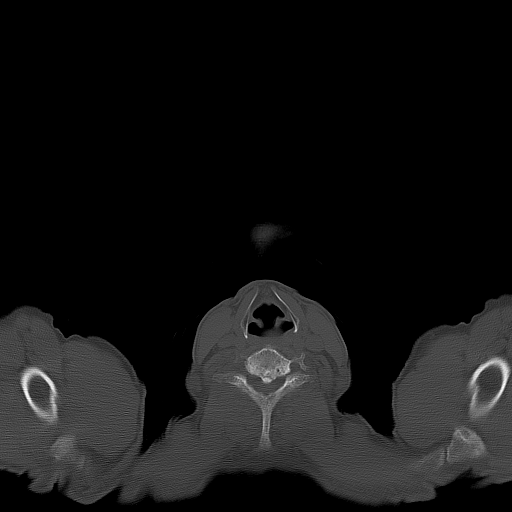

[Series 604: mip range · coronal · 1.98mm/px · 1 of 32 slices shown]
[im 1/32]
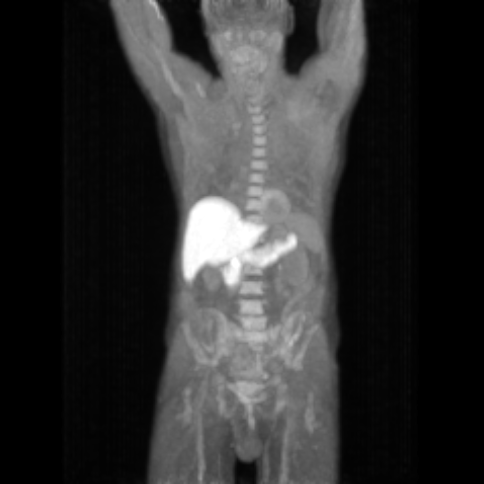

[Series 605: range-ac ct sk_thigh 5.0 hd_fov-cor-<alpha range> · 1 of 45 slices shown]
[im 1/45]
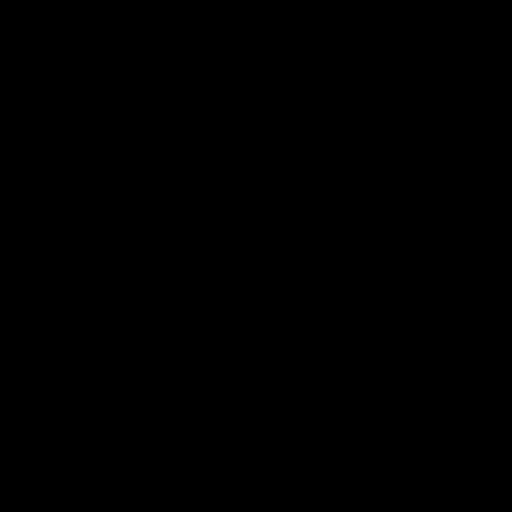

[Series 606: range-ac ct sk_thigh 5.0 hd_fov-tra-<alpha range> · 5 of 227 slices shown]
[im 1/227]
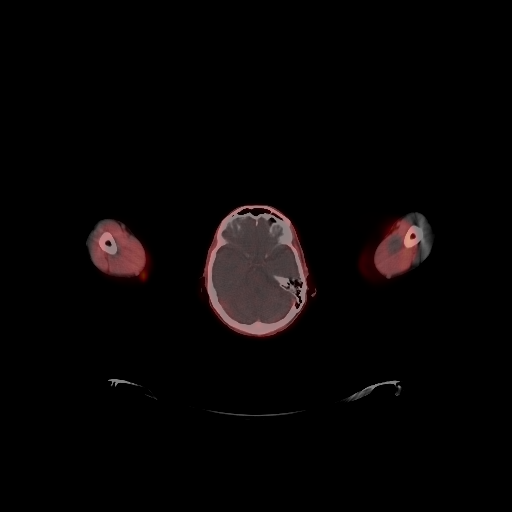
[im 57/227]
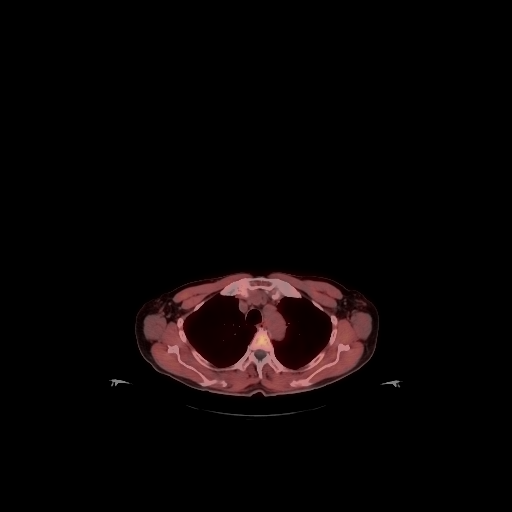
[im 114/227]
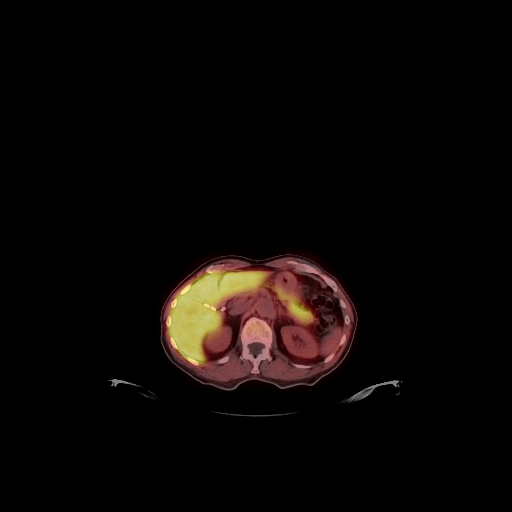
[im 170/227]
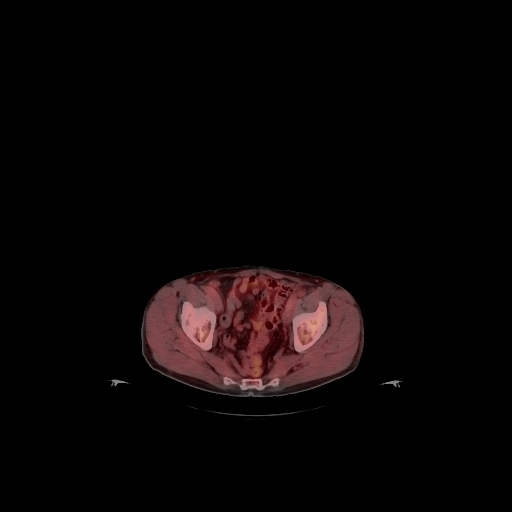
[im 227/227]
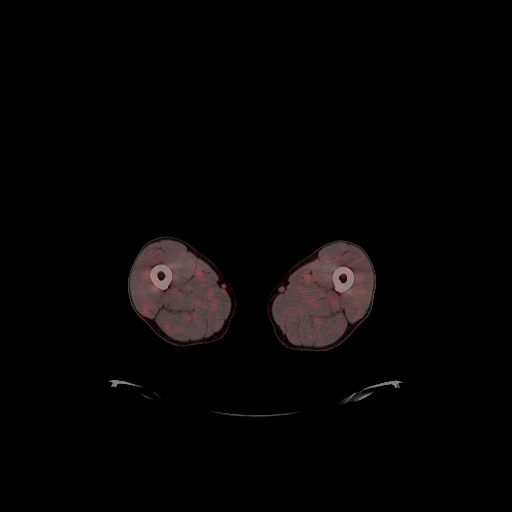

[25 of 25 positions shown; findings below may reference images not displayed]

FINDINGS: NECK

No radiotracer activity in neck lymph nodes.

CHEST

No radiotracer accumulation within mediastinal or hilar lymph nodes.
Peripheral nodularity in the RIGHT middle lobe (image 87 through 95
series 4) does not have associated radiotracer activity. There is
nodularity along the RIGHT oblique fissure this setting level (image
86, series 4).

ABDOMEN/PELVIS

No focal radiotracer activity within the prostate bed.

No radiotracer activity within pelvic lymph nodes. No radiotracer
activity in periaortic lymph nodes. No enlarged lymph nodes
identified.

Physiologic activity noted within the liver and pancreas.

SKELETON

No focal  activity to suggest skeletal metastasis.
IMPRESSION: 1. No evidence of prostate cancer metastasis.
2. No clear evidence of local recurrence in prostate bed.
3. No skeletal metastasis.
4. Peripheral nodularity the RIGHT middle lobe favored post
infectious or inflammatory. Recommend follow-up chest CT in 3-6
months.

## 2018-10-10 ENCOUNTER — Ambulatory Visit: Payer: PPO

## 2018-10-11 ENCOUNTER — Encounter: Payer: PPO | Admitting: Family Medicine

## 2018-12-11 ENCOUNTER — Encounter: Payer: Self-pay | Admitting: Family Medicine

## 2018-12-11 ENCOUNTER — Other Ambulatory Visit: Payer: Self-pay

## 2018-12-11 ENCOUNTER — Ambulatory Visit (INDEPENDENT_AMBULATORY_CARE_PROVIDER_SITE_OTHER): Payer: PPO | Admitting: Family Medicine

## 2018-12-11 VITALS — BP 132/84 | HR 68 | Temp 98.2°F | Ht 74.0 in | Wt 156.0 lb

## 2018-12-11 DIAGNOSIS — E785 Hyperlipidemia, unspecified: Secondary | ICD-10-CM

## 2018-12-11 DIAGNOSIS — Z0000 Encounter for general adult medical examination without abnormal findings: Secondary | ICD-10-CM | POA: Diagnosis not present

## 2018-12-11 DIAGNOSIS — C61 Malignant neoplasm of prostate: Secondary | ICD-10-CM | POA: Diagnosis not present

## 2018-12-11 DIAGNOSIS — R7989 Other specified abnormal findings of blood chemistry: Secondary | ICD-10-CM

## 2018-12-11 LAB — COMPREHENSIVE METABOLIC PANEL
ALT: 14 U/L (ref 0–53)
AST: 20 U/L (ref 0–37)
Albumin: 4.1 g/dL (ref 3.5–5.2)
Alkaline Phosphatase: 53 U/L (ref 39–117)
BUN: 14 mg/dL (ref 6–23)
CO2: 28 mEq/L (ref 19–32)
Calcium: 8.9 mg/dL (ref 8.4–10.5)
Chloride: 99 mEq/L (ref 96–112)
Creatinine, Ser: 0.97 mg/dL (ref 0.40–1.50)
GFR: 74.42 mL/min (ref >60.00)
Glucose, Bld: 84 mg/dL (ref 70–99)
Potassium: 4.1 mEq/L (ref 3.5–5.1)
Sodium: 133 mEq/L — ABNORMAL LOW (ref 135–145)
Total Bilirubin: 1.3 mg/dL — ABNORMAL HIGH (ref 0.2–1.2)
Total Protein: 6.8 g/dL (ref 6.0–8.3)

## 2018-12-11 LAB — LIPID PANEL
Cholesterol: 181 mg/dL (ref 0–200)
HDL: 74.8 mg/dL (ref >39.00)
LDL Cholesterol: 96 mg/dL (ref 0–99)
NonHDL: 106.03
Total CHOL/HDL Ratio: 2
Triglycerides: 48 mg/dL (ref 0.0–149.0)
VLDL: 9.6 mg/dL (ref 0.0–40.0)

## 2018-12-11 LAB — CBC
HCT: 43.8 % (ref 39.0–52.0)
Hemoglobin: 14.9 g/dL (ref 13.0–17.0)
MCHC: 34.1 g/dL (ref 30.0–36.0)
MCV: 91.4 fl (ref 78.0–100.0)
Platelets: 219 10*3/uL (ref 150.0–400.0)
RBC: 4.79 Mil/uL (ref 4.22–5.81)
RDW: 13.7 % (ref 11.5–15.5)
WBC: 6.1 10*3/uL (ref 4.0–10.5)

## 2018-12-11 LAB — TSH: TSH: 4.75 u[IU]/mL — ABNORMAL HIGH (ref 0.35–4.50)

## 2018-12-11 LAB — PSA: PSA: 16.01 ng/mL — ABNORMAL HIGH (ref 0.10–4.00)

## 2018-12-11 NOTE — Progress Notes (Signed)
Phone: [phone]   Subjective:  Patient presents today for their annual physical. Chief complaint-noted.   See problem oriented charting- ROS- full  review of systems was completed and negative except for: congestion at times, hearig loss using hearing aids, post nasal drips, joint pain in fingers, seasonal allergies  The following were reviewed and entered/updated in epic: Past Medical History:  Diagnosis Date  . Adenoma 2008   serrated adenoma of colon  . Diverticulosis   . GERD (gastroesophageal reflux disease)    H. Pylori related-gone when treated  . Hyperlipidemia   . Prostate cancer [facility]) 1994   radical prostatectomy Dr. Gentry Fitz  . Seasonal allergies   . Sinusitis acute    Patient Active Problem List   Diagnosis Date Noted  . Elevated TSH 06/02/2014    Priority: Medium  . Prostate cancer [facility])     Priority: Medium  . Hyperlipidemia 03/10/2009    Priority: Medium  . History of colon polyps     Priority: Low  . Basal cell cancer 04/08/2013    Priority: Low  . ALLERGIC RHINITIS 08/06/2007    Priority: Low  . Varicose vein of leg 12/08/2017  . Hearing loss 12/08/2017  . Hemorrhagic prepatellar bursitis of left knee 09/16/2015   Past Surgical History:  Procedure Laterality Date  . APPENDECTOMY    . CATARACT EXTRACTION, BILATERAL Bilateral 2018  . CHOLECYSTECTOMY  05/2009  . prostatectomy  1994   DR[facility]  . TONSILLECTOMY      Family History  Problem Relation Age of Onset  . Prostate cancer Paternal Uncle   . Prostate cancer Cousin   . Pneumonia Father        aspiration  . Other Unknown 46       smoker. sudden death unclear cause  . Colon cancer Neg Hx     Medications- reviewed and updated Current Outpatient Medications  Medication Sig Dispense Refill  . cetirizine (ZYRTEC) 10 MG tablet Take 10 mg by mouth daily.     No current facility-administered medications for this visit.     Allergies-reviewed and updated No Known Allergies   Social History   Social History Narrative   [place]    Came down to [employer] for graduate studies- Phd in botany.    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       Married 1985.1st marriage. No kids. No pets.       Hobbies- above + gardening      Objective  Objective:  BP 132/84   Pulse 68   Temp 98.2 F (36.8 C) (Oral)   Ht 6\' 2"  (1.88 m)   Wt 156 lb (70.8 kg)   SpO2 96%   BMI 20.03 kg/m  Gen: NAD, resting comfortably HEENT: Mucous membranes are moist. Oropharynx normal Neck: no thyromegaly or cervical lymphadenopathy CV: RRR no murmurs rubs or gallops Lungs: CTAB no crackles, wheeze, rhonchi Abdomen: soft/nontender/nondistended/normal bowel sounds. No rebound or guarding.  Ext: no edema but prominent varicosities L>R Skin: warm, dry Neuro: grossly normal, moves all extremities, PERRLA    Assessment and Plan  78 y.o. male presenting for annual physical.  Health Maintenance counseling: 1. Anticipatory guidance: Patient counseled regarding regular dental exams -q6 months, eye exams -yearly,  avoiding smoking and second hand smoke , limiting alcohol to 2 beverages per day.   2. Risk factor reduction:  Advised patient of need for regular exercise and diet rich and fruits and vegetables to reduce risk of heart attack and stroke. Exercise- hasnt been able to do his dancing- ative out in his yard and getting some walking in. hasnt done elliptical as busy gardening. Diet-eating reasonably healthy diet.  Lost another 3 pounds from last year-recommended no further weight loss Wt Readings from Last 3 Encounters:  12/11/18 156 lb (70.8 kg)  12/08/17 159 lb 12.8 oz (72.5 kg)  10/04/17 166 lb (75.3 kg)  3. Immunizations/screenings/ancillary studies-fully up-to-date  Immunization History  Administered Date(s) Administered  . Hepatitis A, Adult  12/16/1993, 07/28/1994  . Influenza Whole 03/28/1999  . Influenza,inj,Quad PF,6+ Mos 04/03/2013, 03/19/2014  . Influenza-Unspecified 04/24/2015, 03/21/2016, 03/21/2017, 03/25/2018  . Pneumococcal Conjugate-13 06/02/2014  . Pneumococcal Polysaccharide-23 12/07/2006  . Td 12/07/2006  . Tdap 11/04/2015  . Typhoid Inactivated 12/16/1993  . Yellow Fever 12/16/1993  . Zoster 02/23/2010  . Zoster Recombinat (Shingrix) 12/13/2017, 02/21/2018  4. Prostate cancer follow-up- history of radical prostatectomy- he follows with Dr. Junious Silk of urology- advised him to schedule follow up with Dr. Junious Silk.  We will trend PSA today. Has had 2 pet scans in past.  Lab Results  Component Value Date   PSA 12.69 (H) 12/08/2017   PSA 12.60 08/15/2017   PSA 9.17 11/04/2016   5. Colon cancer screening - last completed in 2016-no further colonoscopy due to age 89. Skin cancer screening-follows with Dr. Ronnald Ramp yearly-history of skin cancer.advised regular sunscreen use. Denies worrisome, changing, or new skin lesions.  7.  Never smoker   Status of chronic or acute concerns   #Prostate Cancer-follows  with Dr. Jari Pigg PSA today. He declined salvage radiation and androgen deprivation therapy despite PSA trending up- only wants to go back if gets big spike in PSA and would consider repeat PET or therapy at that point. He really does not want to pursue treatment unless symptomatic.   #Hyperlipidemia- repeat lipid panel.  Would not recommend starting statin at age 88 for primary prevention but he will continue to work on healthy eating and regular exercise  #Elevated TSH-mild elevation in the past-trend TSH  #Bilateral Hearing Loss- referred to local audiologist last year- wearing hearing aids now and finds helpful (didn't wear today)  #likely mild to moderate arthritis in hand- naproxen tries occasionally and not sure if it helps  #varicosities noted in both legs L>R- continue compression stockings -  discussed possible vascular consult which he declines unless he has pain  1 year for CPE Lab/Order associations: FASTING    ICD-10-CM   1. Preventative health care  Z00.00 TSH    CBC    Lipid panel    Comprehensive metabolic panel    PSA  2. Elevated TSH  R79.89 TSH  3. Prostate cancer ([facility]CC)  C61 PSA  4. Hyperlipidemia, unspecified hyperlipidemia type  E78.5 CBC    Lipid panel    Comprehensive metabolic panel    No orders of the defined types were placed in this encounter.   Return precautions advised.  Garret Reddish, MD

## 2018-12-11 NOTE — Patient Instructions (Addendum)
Please stop by lab before you go If you do not have mychart- we will call you about results within 5 business days of [place] receiving them.  If you have mychart- we will send your results within 3 business days of [place] receiving them.  If abnormal or we want to clarify a result, we will call or mychart you to make sure you receive the message.  If you have questions or concerns or don't hear within 5-7 days, please send [place] a message or call us.    Glad you are doing so well! I bet that garden looks great! Hoping you can get back to dancing sometime in the next year or so

## 2019-04-16 ENCOUNTER — Telehealth: Payer: Self-pay | Admitting: Family Medicine

## 2019-04-16 NOTE — Telephone Encounter (Signed)
I left a message asking the patient to call and schedule Medicare AWV with Courtney ([facility] Health Coach).  If patient calls back, please schedule Medicare Wellness Visit at next available opening.  VDM (Dee-Dee) °

## 2019-04-23 ENCOUNTER — Other Ambulatory Visit: Payer: Self-pay

## 2019-04-23 ENCOUNTER — Ambulatory Visit (INDEPENDENT_AMBULATORY_CARE_PROVIDER_SITE_OTHER): Payer: PPO

## 2019-04-23 VITALS — BP 132/76 | Temp 98.4°F | Ht 74.0 in | Wt 159.0 lb

## 2019-04-23 DIAGNOSIS — Z0000 Encounter for general adult medical examination without abnormal findings: Secondary | ICD-10-CM

## 2019-04-23 NOTE — Patient Instructions (Signed)
Juan Lin , Thank you for taking time to come for your Medicare Wellness Visit. I appreciate your ongoing commitment to your health goals. Please review the following plan we discussed and let me know if I can assist you in the future.   Screening recommendations/referrals: Colorectal Screening: up to date; last 08/15/14 with Dr. Olevia Perches   Vision and Dental Exams: Recommended annual ophthalmology exams for early detection of glaucoma and other disorders of the eye Recommended annual dental exams for proper oral hygiene  Vaccinations: Influenza vaccine: up to date; completed at hospital  Pneumococcal vaccine: up to date; last 06/02/14 Tdap vaccine: up to date; last 11/04/15  Shingles vaccine:Shingrix completed   Advanced directives: Please bring a copy of your POA (Power of [place]) and/or Living Will to your next appointment.  Goals: Recommend to drink at least 6-8 8oz glasses of water per day and consume a balanced diet rich in fresh fruits and vegetables.   Next appointment: Please schedule your Annual Wellness Visit with your Nurse Health Advisor in one year.  Preventive Care 51 Years and Older, Male Preventive care refers to lifestyle choices and visits with your health care provider that can promote health and wellness. What does preventive care include?  A yearly physical exam. This is also called an annual well check.  Dental exams once or twice a year.  Routine eye exams. Ask your health care provider how often you should have your eyes checked.  Personal lifestyle choices, including:  Daily care of your teeth and gums.  Regular physical activity.  Eating a healthy diet.  Avoiding tobacco and drug use.  Limiting alcohol use.  Practicing safe sex.  Taking low doses of aspirin every day if recommended by your health care provider..  Taking vitamin and mineral supplements as recommended by your health care provider. What happens during an annual well check? The  services and screenings done by your health care provider during your annual well check will depend on your age, overall health, lifestyle risk factors, and family history of disease. Counseling  Your health care provider may ask you questions about your:  Alcohol use.  Tobacco use.  Drug use.  Emotional well-being.  Home and relationship well-being.  Sexual activity.  Eating habits.  History of falls.  Memory and ability to understand (cognition).  Work and work Statistician. Screening  You may have the following tests or measurements:  Height, weight, and BMI.  Blood pressure.  Lipid and cholesterol levels. These may be checked every 5 years, or more frequently if you are over 32 years old.  Skin check.  Lung cancer screening. You may have this screening every year starting at age 46 if you have a 30-pack-year history of smoking and currently smoke or have quit within the past 15 years.  Fecal occult blood test (FOBT) of the stool. You may have this test every year starting at age 34.  Flexible sigmoidoscopy or colonoscopy. You may have a sigmoidoscopy every 5 years or a colonoscopy every 10 years starting at age 29.  Prostate cancer screening. Recommendations will vary depending on your family history and other risks.  Hepatitis C blood test.  Hepatitis B blood test.  Sexually transmitted disease (STD) testing.  Diabetes screening. This is done by checking your blood sugar (glucose) after you have not eaten for a while (fasting). You may have this done every 1-3 years.  Abdominal aortic aneurysm (AAA) screening. You may need this if you are a current or former smoker.  Osteoporosis. You may be screened starting at age 65 if you are at high risk. Talk with your health care provider about your test results, treatment options, and if necessary, the need for more tests. Vaccines  Your health care provider may recommend certain vaccines, such as:  Influenza  vaccine. This is recommended every year.  Tetanus, diphtheria, and acellular pertussis (Tdap, Td) vaccine. You may need a Td booster every 10 years.  Zoster vaccine. You may need this after age 41.  Pneumococcal 13-valent conjugate (PCV13) vaccine. One dose is recommended after age 23.  Pneumococcal polysaccharide (PPSV23) vaccine. One dose is recommended after age 78. Talk to your health care provider about which screenings and vaccines you need and how often you need them. This information is not intended to replace advice given to you by your health care provider. Make sure you discuss any questions you have with your health care provider. Document Released: 07/10/2015 Document Revised: 03/02/2016 Document Reviewed: 04/14/2015 Elsevier Interactive Patient Education  2017 [place] Prevention in the Home Falls can cause injuries. They can happen to people of all ages. There are many things you can do to make your home safe and to help prevent falls. What can I do on the outside of my home?  Regularly fix the edges of walkways and driveways and fix any cracks.  Remove anything that might make you trip as you walk through a door, such as a raised step or threshold.  Trim any bushes or trees on the path to your home.  Use bright outdoor lighting.  Clear any walking paths of anything that might make someone trip, such as rocks or tools.  Regularly check to see if handrails are loose or broken. Make sure that both sides of any steps have handrails.  Any raised decks and porches should have guardrails on the edges.  Have any leaves, snow, or ice cleared regularly.  Use sand or salt on walking paths during winter.  Clean up any spills in your garage right away. This includes oil or grease spills. What can I do in the bathroom?  Use night lights.  Install grab bars by the toilet and in the tub and shower. Do not use towel bars as grab bars.  Use non-skid mats or decals  in the tub or shower.  If you need to sit down in the shower, use a plastic, non-slip stool.  Keep the floor dry. Clean up any water that spills on the floor as soon as it happens.  Remove soap buildup in the tub or shower regularly.  Attach bath mats securely with double-sided non-slip rug tape.  Do not have throw rugs and other things on the floor that can make you trip. What can I do in the bedroom?  Use night lights.  Make sure that you have a light by your bed that is easy to reach.  Do not use any sheets or blankets that are too big for your bed. They should not hang down onto the floor.  Have a firm chair that has side arms. You can use this for support while you get dressed.  Do not have throw rugs and other things on the floor that can make you trip. What can I do in the kitchen?  Clean up any spills right away.  Avoid walking on wet floors.  Keep items that you use a lot in easy-to-reach places.  If you need to reach something above you, use a strong step  stool that has a grab bar.  Keep electrical cords out of the way.  Do not use floor polish or wax that makes floors slippery. If you must use wax, use non-skid floor wax.  Do not have throw rugs and other things on the floor that can make you trip. What can I do with my stairs?  Do not leave any items on the stairs.  Make sure that there are handrails on both sides of the stairs and use them. Fix handrails that are broken or loose. Make sure that handrails are as long as the stairways.  Check any carpeting to make sure that it is firmly attached to the stairs. Fix any carpet that is loose or worn.  Avoid having throw rugs at the top or bottom of the stairs. If you do have throw rugs, attach them to the floor with carpet tape.  Make sure that you have a light switch at the top of the stairs and the bottom of the stairs. If you do not have them, ask someone to add them for you. What else can I do to help  prevent falls?  Wear shoes that:  Do not have high heels.  Have rubber bottoms.  Are comfortable and fit you well.  Are closed at the toe. Do not wear sandals.  If you use a stepladder:  Make sure that it is fully opened. Do not climb a closed stepladder.  Make sure that both sides of the stepladder are locked into place.  Ask someone to hold it for you, if possible.  Clearly mark and make sure that you can see:  Any grab bars or handrails.  First and last steps.  Where the edge of each step is.  Use tools that help you move around (mobility aids) if they are needed. These include:  Canes.  Walkers.  Scooters.  Crutches.  Turn on the lights when you go into a dark area. Replace any light bulbs as soon as they burn out.  Set up your furniture so you have a clear path. Avoid moving your furniture around.  If any of your floors are uneven, fix them.  If there are any pets around you, be aware of where they are.  Review your medicines with your doctor. Some medicines can make you feel dizzy. This can increase your chance of falling. Ask your doctor what other things that you can do to help prevent falls. This information is not intended to replace advice given to you by your health care provider. Make sure you discuss any questions you have with your health care provider. Document Released: 04/09/2009 Document Revised: 11/19/2015 Document Reviewed: 07/18/2014 Elsevier Interactive Patient Education  2017 [employer].

## 2019-04-23 NOTE — Progress Notes (Signed)
Subjective:   Juan Lin is a 78 y.o. male who presents for Medicare Annual/Subsequent preventive examination.  Review of Systems:   Cardiac Risk Factors include: advanced age (>47men, >47 women);male gender    Objective:    Vitals: BP 132/76   Temp 98.4 F (36.9 C)   Ht 6\' 2"  (1.88 m)   Wt 159 lb (72.1 kg)   BMI 20.41 kg/m   Body mass index is 20.41 kg/m.  Advanced Directives 04/23/2019 10/04/2017 06/17/2016 08/01/2014  Does Patient Have a Medical Advance Directive? Yes Yes Yes Yes  Type of Advance Directive Living will;Healthcare Power of Attorney Living will;Healthcare Power of [place];Living will [place];Living will  Does patient want to make changes to medical advance directive? No - Patient declined No - Patient declined - -  Copy of [place] in Chart? No - copy requested No - copy requested - No - copy requested    Tobacco Social History   Tobacco Use  Smoking Status Never Smoker  Smokeless Tobacco Never Used     Counseling given: Not Answered   Clinical Intake:  Pre-visit preparation completed: Yes  Pain : No/denies pain   Diabetes: No  How often do you need to have someone help you when you read instructions, pamphlets, or other written materials from your doctor or pharmacy?: 1 - Never  Interpreter Needed?: No  Information entered by :: Denman George LPN  Past Medical History:  Diagnosis Date  . Adenoma 2008   serrated adenoma of colon  . Diverticulosis   . GERD (gastroesophageal reflux disease)    H. Pylori related-gone when treated  . Hyperlipidemia   . Prostate cancer [facility]) 1994   radical prostatectomy Dr. Gentry Fitz  . Seasonal allergies   . Sinusitis acute    Past Surgical History:  Procedure Laterality Date  . APPENDECTOMY    . CATARACT EXTRACTION, BILATERAL Bilateral 2018  . CHOLECYSTECTOMY  05/2009  . prostatectomy  1994   DR.MOHLER [facility]  .  TONSILLECTOMY     Family History  Problem Relation Age of Onset  . Prostate cancer Paternal Uncle   . Prostate cancer Cousin   . Pneumonia Father        aspiration  . Other Other 46       smoker. sudden death unclear cause  . Colon cancer Neg Hx    Social History   Socioeconomic History  . Marital status: Married    Spouse name: Not on file  . Number of children: Not on file  . Years of education: Not on file  . Highest education level: Not on file  Occupational History    Comment: currently volunteers as musician  Social Needs  . Financial resource strain: Not on file  . Food insecurity    Worry: Not on file    Inability: Not on file  . Transportation needs    Medical: Not on file    Non-medical: Not on file  Tobacco Use  . Smoking status: Never Smoker  . Smokeless tobacco: Never Used  Substance and Sexual Activity  . Alcohol use: Yes    Alcohol/week: 7.0 standard drinks    Types: 7 Standard drinks or equivalent per week  . Drug use: Yes    Types: Amyl nitrate, IV  . Sexual activity: Not on file  Lifestyle  . Physical activity    Days per week: Not on file    Minutes per session:  Not on file  . Stress: Not on file  Relationships  . Social Herbalist on phone: Not on file    Gets together: Not on file    Attends religious service: Not on file    Active member of club or organization: Not on file    Attends meetings of clubs or organizations: Not on file    Relationship status: Not on file  Other Topics Concern  . Not on file  Social History Narrative   From [place]    Came down to [employer] for graduate studies- Phd in botany.    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       Married 1985.1st marriage. No kids. No pets.       Hobbies- above + gardening       Outpatient Encounter Medications as of 04/23/2019   Medication Sig  . cetirizine (ZYRTEC) 10 MG tablet Take 10 mg by mouth daily.  [name] guaiFENesin (MUCINEX) 600 MG 12 hr tablet Take by mouth 2 (two) times daily as needed.  . naproxen sodium (ALEVE) 220 MG tablet Take 220 mg by mouth as needed.   No facility-administered encounter medications on file as of 04/23/2019.     Activities of Daily Living In your present state of health, do you have any difficulty performing the following activities: 04/23/2019  Hearing? N  Vision? N  Difficulty concentrating or making decisions? N  Walking or climbing stairs? N  Dressing or bathing? N  Doing errands, shopping? N  Preparing Food and eating ? N  Using the Toilet? N  In the past six months, have you accidently leaked urine? N  Do you have problems with loss of bowel control? N  Managing your Medications? N  Managing your Finances? N  Housekeeping or managing your Housekeeping? N  Some recent data might be hidden    Patient Care Team: Marin Olp, MD as PCP - General (Family Medicine) Mincey, Neena Rhymes, MD as Consulting Physician (Ophthalmology) Festus Aloe, MD as Consulting Physician (Urology) Sherilyn Banker, Bevely Palmer, MD as Consulting Physician (Audiology)   Assessment:   This is a routine wellness examination for Juan Lin.  Exercise Activities and Dietary recommendations Current Exercise Habits: Home exercise routine, Type of exercise: treadmill;stretching;Other - see comments(yardwork), Time (Minutes): 45, Frequency (Times/Week): 5, Weekly Exercise (Minutes/Week): 225, Intensity: Moderate  Goals    . Exercise 150 minutes per week (moderate activity)     Will do music and dance  Plays in several bands;     [name] Maintain current health status        Fall Risk Fall Risk  04/23/2019 12/11/2018 10/04/2017 06/17/2016 07/09/2015  Falls in the past year? 0 0 No No No  Injury with Fall? 0 - - - -  Follow up Education provided;Falls prevention discussed;Falls evaluation completed - -  - -   Is the patient's home free of loose throw rugs in walkways, pet beds, electrical cords, etc?   yes      Grab bars in the bathroom? yes      Handrails on the stairs?   yes      Adequate lighting?   yes  Timed Get Up and Go Performed: completed and within normal timeframe; no gait abnormalities noted   Depression Screen PHQ 2/9 Scores 04/23/2019 12/11/2018 10/04/2017 06/17/2016  PHQ - 2 Score 0  0 0 0  PHQ- 9 Score - - 0 -    Cognitive Function-no cognitive concerns at this time  MMSE - Mini Mental State Exam 06/17/2016  Not completed: (No Data)     6CIT Screen 04/23/2019  What Year? 0 points  What month? 0 points  What time? 0 points  Count back from 20 0 points  Months in reverse 0 points  Repeat phrase 0 points  Total Score 0    Immunization History  Administered Date(s) Administered  . Hepatitis A, Adult 12/16/1993, 07/28/1994  . Influenza Whole 03/28/1999  . Influenza,inj,Quad PF,6+ Mos 04/03/2013, 03/19/2014  . Influenza-Unspecified 04/24/2015, 03/21/2016, 03/21/2017, 03/25/2018  . Pneumococcal Conjugate-13 06/02/2014  . Pneumococcal Polysaccharide-23 12/07/2006  . Td 12/07/2006  . Tdap 11/04/2015  . Typhoid Inactivated 12/16/1993  . Yellow Fever 12/16/1993  . Zoster 02/23/2010  . Zoster Recombinat (Shingrix) 12/13/2017, 02/21/2018    Qualifies for Shingles Vaccine? Shingrix completed   Screening Tests Health Maintenance  Topic Date Due  . INFLUENZA VACCINE  01/26/2019  . TETANUS/TDAP  11/03/2025  . PNA vac Low Risk Adult  Completed   Cancer Screenings: Lung: Low Dose CT Chest recommended if Age 26-80 years, 30 pack-year currently smoking OR have quit w/in 15years. Patient does not qualify. Colorectal: colonoscopy 2/19/1 with Dr. Olevia Perches       Plan:  I have personally reviewed and addressed the Medicare Annual Wellness questionnaire and have noted the following in the patient's chart:  A. Medical and social history B. Use of alcohol, tobacco or  illicit drugs  C. Current medications and supplements D. Functional ability and status E.  Nutritional status F.  Physical activity G. Advance directives H. List of other physicians I.  Hospitalizations, surgeries, and ER visits in previous 12 months J.  [place] such as hearing and vision if needed, cognitive and depression L. Referrals, records requested, and appointments- none   In addition, I have reviewed and discussed with patient certain preventive protocols, quality metrics, and best practice recommendations. A written personalized care plan for preventive services as well as general preventive health recommendations were provided to patient.   Signed,  Denman George, LPN  Nurse Health Advisor   Nurse Notes: no additional

## 2019-04-23 NOTE — Progress Notes (Signed)
I have reviewed and agree with note, evaluation, plan.   Colonoscopy clarification was in 2016  Garret Reddish, MD

## 2019-07-02 ENCOUNTER — Encounter: Payer: Self-pay | Admitting: Family Medicine

## 2019-07-17 ENCOUNTER — Ambulatory Visit: Payer: PPO | Attending: Internal Medicine

## 2019-07-17 DIAGNOSIS — Z23 Encounter for immunization: Secondary | ICD-10-CM | POA: Insufficient documentation

## 2019-07-17 NOTE — Progress Notes (Signed)
   Covid-19 Vaccination Clinic  Name:  Juan Lin    MRN: [id number] DOB: January 06, 1941  07/17/2019  Juan Lin was observed post Covid-19 immunization for 15 minutes without incidence. He was provided with Vaccine Information Sheet and instruction to access the V-Safe system.   Juan Lin was instructed to call 911 with any severe reactions post vaccine: [name] Difficulty breathing  . Swelling of your face and throat  . A fast heartbeat  . A bad rash all over your body  . Dizziness and weakness    Immunizations Administered    Name Date Dose VIS Date Route   Pfizer COVID-19 Vaccine 07/17/2019  1:48 PM 0.3 mL 06/07/2019 Intramuscular   Manufacturer: [place]   Lot: [id number]   [place]: [id number]

## 2019-08-07 ENCOUNTER — Ambulatory Visit: Payer: PPO | Attending: Internal Medicine

## 2019-08-07 DIAGNOSIS — Z23 Encounter for immunization: Secondary | ICD-10-CM | POA: Insufficient documentation

## 2019-08-07 NOTE — Progress Notes (Signed)
   Covid-19 Vaccination Clinic  Name:  Juan Lin    MRN: [id number] DOB: April 20, 1941  08/07/2019  [name] was observed post Covid-19 immunization for 15 minutes without incidence. He was provided with Vaccine Information Sheet and instruction to access the V-Safe system.   [name] was instructed to call 911 with any severe reactions post vaccine: [name] Difficulty breathing  . Swelling of your face and throat  . A fast heartbeat  . A bad rash all over your body  . Dizziness and weakness    Immunizations Administered    Name Date Dose VIS Date Route   Pfizer COVID-19 Vaccine 08/07/2019  3:33 PM 0.3 mL 06/07/2019 Intramuscular   Manufacturer: [place]   Lot: [id number]   [place]: [id number]

## 2019-09-19 ENCOUNTER — Encounter: Payer: Self-pay | Admitting: Family Medicine

## 2019-10-21 DIAGNOSIS — C44722 Squamous cell carcinoma of skin of right lower limb, including hip: Secondary | ICD-10-CM | POA: Diagnosis not present

## 2019-10-21 DIAGNOSIS — L821 Other seborrheic keratosis: Secondary | ICD-10-CM | POA: Diagnosis not present

## 2019-10-21 DIAGNOSIS — C44619 Basal cell carcinoma of skin of left upper limb, including shoulder: Secondary | ICD-10-CM | POA: Diagnosis not present

## 2019-10-21 DIAGNOSIS — L570 Actinic keratosis: Secondary | ICD-10-CM | POA: Diagnosis not present

## 2019-10-21 DIAGNOSIS — D485 Neoplasm of uncertain behavior of skin: Secondary | ICD-10-CM | POA: Diagnosis not present

## 2019-10-21 DIAGNOSIS — Z85828 Personal history of other malignant neoplasm of skin: Secondary | ICD-10-CM | POA: Diagnosis not present

## 2019-10-21 DIAGNOSIS — D1801 Hemangioma of skin and subcutaneous tissue: Secondary | ICD-10-CM | POA: Diagnosis not present

## 2019-10-21 DIAGNOSIS — C44519 Basal cell carcinoma of skin of other part of trunk: Secondary | ICD-10-CM | POA: Diagnosis not present

## 2019-10-21 DIAGNOSIS — D225 Melanocytic nevi of trunk: Secondary | ICD-10-CM | POA: Diagnosis not present

## 2019-12-17 ENCOUNTER — Encounter: Payer: PPO | Admitting: Family Medicine

## 2020-01-14 ENCOUNTER — Telehealth: Payer: Self-pay | Admitting: Family Medicine

## 2020-01-14 NOTE — Telephone Encounter (Signed)
Appt scheduled for 01/20/20.

## 2020-01-14 NOTE — Telephone Encounter (Signed)
Nurse Assessment Nurse: Joline Salt, RN, Malachy Mood Date/Time [name] Time): 01/14/2020 1:35:38 PM Confirm and document reason for call. If symptomatic, describe symptoms. ---Caller states he had sudden pain groin pain after work out, hurts to stand or walk. He has a bulge and worked it back in. Has the patient had close contact with a person known or suspected to have the novel coronavirus illness OR traveled / lives in area with major community spread (including international travel) in the last 14 days from the onset of symptoms? * If Asymptomatic, screen for exposure and travel within the last 14 days. ---No Does the patient have any new or worsening symptoms? ---Yes Will a triage be completed? ---Yes Related visit to physician within the last 2 weeks? ---No Does the PT have any chronic conditions? (i.e. diabetes, asthma, this includes High risk factors for pregnancy, etc.) ---No Is this a behavioral health or substance abuse call? ---No Guidelines Guideline Title Affirmed Question Affirmed Notes Nurse Date/Time (Eastern Time) Hernia [1] New-onset hernia suspected (reducible bulge in groin or abdomen; non-tender) AND [2] NO pain or vomiting Catha Brow 01/14/2020 1:39:08 PMPLEASE NOTE: All timestamps contained within this report are represented as [place] Standard Time. CONFIDENTIALTY NOTICE: This fax transmission is intended only for the addressee. It contains information that is legally privileged, confidential or otherwise protected from use or disclosure. If you are not the intended recipient, you are strictly prohibited from reviewing, disclosing, copying using or disseminating any of this information or taking any action in reliance on or regarding this information. If you have received this fax in error, please notify us immediately by telephone so that we can arrange for its return to [place]. Phone: [phone], Toll-Free: [phone], Fax: [phone] Page: 2 of 2 Call  Id: [id number] [place]. Time [name] Time) Disposition Final User 01/14/2020 1:33:53 PM Send to Urgent Queue Dalia Heading 01/14/2020 1:42:00 PM SEE PCP WITHIN 3 DAYS Yes Joline Salt, RN, Erskine Speed Disagree/Comply Comply Caller Understands Yes PreDisposition Did not know what to do Care Advice Given Per Guideline SEE PCP WITHIN 3 DAYS: * You need to be seen within 2 or 3 days. AVOID STRAINING: * Avoid straining from constipation * Avoid straining from heavy lifting * No smoking (Reason: causes cough) CALL BACK IF: * Lump (bulge, hernia) becomes painful or tender to touch * Abdominal pain or vomiting occur * You become worse

## 2020-01-20 ENCOUNTER — Ambulatory Visit (INDEPENDENT_AMBULATORY_CARE_PROVIDER_SITE_OTHER): Payer: PPO | Admitting: Family Medicine

## 2020-01-20 ENCOUNTER — Encounter: Payer: Self-pay | Admitting: Family Medicine

## 2020-01-20 ENCOUNTER — Other Ambulatory Visit: Payer: Self-pay

## 2020-01-20 VITALS — BP 128/78 | HR 92 | Temp 98.7°F | Ht 74.0 in | Wt 152.0 lb

## 2020-01-20 DIAGNOSIS — K4090 Unilateral inguinal hernia, without obstruction or gangrene, not specified as recurrent: Secondary | ICD-10-CM | POA: Diagnosis not present

## 2020-01-20 NOTE — Progress Notes (Signed)
   Juan Lin is a 79 y.o. male who presents today for an office visit.  Assessment/Plan:  New/Acute Problems: Inguinal hernia No red flags. Will place referral to surgeon. Discussed warning signs and reasons to return to care. Can use OTC meds if needed.    Subjective:  HPI:  Patient with right groin bulge. That has been there for months. Gets worse with exertion. He able to self reduce.  Felt sudden onset pain after getting into a car last week. Symptoms shortly subsided afterwards. He has been using pressure in the area to reduce the size. He is concerned about possible hernia.       Objective:  Physical Exam: BP 128/78   Pulse 92   Temp 98.7 F (37.1 C) (Temporal)   Ht 6\' 2"  (1.88 m)   Wt 152 lb (68.9 kg)   SpO2 97%   BMI 19.52 kg/m   Gen: No acute distress, resting comfortably GU: Bulge in right inguinal crease. Worse with Valsalva. Easily reducible. Neuro: Grossly normal, moves all extremities Psych: Normal affect and thought content      Juan Lin M. Jerline Pain, MD 01/20/2020 11:32 AM

## 2020-01-20 NOTE — Patient Instructions (Signed)
It was very nice to see you today!  You have a hernia. I will place a referral for you to see a surgeon.  Take care, Dr Jerline Pain   Inguinal Hernia, Adult An inguinal hernia is when fat or your intestines push through a weak spot in a muscle where your leg meets your lower belly (groin). This causes a rounded lump (bulge). This kind of hernia could also be:  In your scrotum, if you are male.  In folds of skin around your vagina, if you are male. There are three types of inguinal hernias. These include:  Hernias that can be pushed back into the belly (are reducible). This type rarely causes pain.  Hernias that cannot be pushed back into the belly (are incarcerated).  Hernias that cannot be pushed back into the belly and lose their blood supply (are strangulated). This type needs emergency surgery. If you do not have symptoms, you may not need treatment. If you have symptoms or a large hernia, you may need surgery. Follow these instructions at home: Lifestyle  Do these things if told by your doctor so you do not have trouble pooping (constipation): ? Drink enough fluid to keep your pee (urine) pale yellow. ? Eat foods that have a lot of fiber. These include fresh fruits and vegetables, whole grains, and beans. ? Limit foods that are high in fat and processed sugars. These include foods that are fried or sweet. ? Take medicine for trouble pooping.  Avoid lifting heavy objects.  Avoid standing for long amounts of time.  Do not use any products that contain nicotine or tobacco. These include cigarettes and e-cigarettes. If you need help quitting, ask your doctor.  Stay at a healthy weight. General instructions  You may try to push your hernia in by very gently pressing on it when you are lying down. Do not try to force the bulge back in if it will not push in easily.  Watch your hernia for any changes in shape, size, or color. Tell your doctor if you see any changes.  Take  over-the-counter and prescription medicines only as told by your doctor.  Keep all follow-up visits as told by your doctor. This is important. Contact a doctor if:  You have a fever.  You have new symptoms.  Your symptoms get worse. Get help right away if:  The area where your leg meets your lower belly has: ? Pain that gets worse suddenly. ? A bulge that gets bigger suddenly, and it does not get smaller after that. ? A bulge that turns red or purple. ? A bulge that is painful when you touch it.  You are a man, and your scrotum: ? Suddenly feels painful. ? Suddenly changes in size.  You cannot push the hernia in by very gently pressing on it when you are lying down. Do not try to force the bulge back in if it will not push in easily.  You feel sick to your stomach (nauseous), and that feeling does not go away.  You throw up (vomit), and that keeps happening.  You have a fast heartbeat.  You cannot poop (have a bowel movement) or pass gas. These symptoms may be an emergency. Do not wait to see if the symptoms will go away. Get medical help right away. Call your local emergency services (911 in the [place] Summary  An inguinal hernia is when fat or your intestines push through a weak spot in a muscle where your leg meets  your lower belly (groin). This causes a rounded lump (bulge).  If you do not have symptoms, you may not need treatment. If you have symptoms or a large hernia, you may need surgery.  Avoid lifting heavy objects. Also avoid standing for long amounts of time.  Do not try to force the bulge back in if it will not push in easily. This information is not intended to replace advice given to you by your health care provider. Make sure you discuss any questions you have with your health care provider. Document Revised: 07/15/2017 Document Reviewed: 03/15/2017 Elsevier Patient Education  [place].

## 2020-02-03 NOTE — Progress Notes (Signed)
Phone: [phone]   Subjective:  Patient presents today for their annual physical. Chief complaint-noted.   See problem oriented charting- Review of Systems  HENT:       Post nasal drip and runny nose for decades without worsening  Cardiovascular: Positive for leg swelling (varicose veins).  Gastrointestinal: Positive for constipation (some with dance camp- better now).  Genitourinary:       Some difficulty voiding. Urinary frequency 1-4x a night. Inguinal henia.   Musculoskeletal: Positive for joint pain (knee pain and finger pain).  Endo/Heme/Allergies: Positive for environmental allergies.  All other systems reviewed and are negative. ROS sheet used/completed by patient and input as above  The following were reviewed and entered/updated in epic: Past Medical History:  Diagnosis Date  . Adenoma 2008   serrated adenoma of colon  . Diverticulosis   . GERD (gastroesophageal reflux disease)    H. Pylori related-gone when treated  . Hyperlipidemia   . Prostate cancer [facility]) 1994   radical prostatectomy Dr. Gentry Fitz  . Seasonal allergies   . Sinusitis acute    Patient Active Problem List   Diagnosis Date Noted  . Elevated TSH 06/02/2014    Priority: Medium  . Prostate cancer [facility])     Priority: Medium  . Hyperlipidemia 03/10/2009    Priority: Medium  . History of colon polyps     Priority: Low  . Basal cell cancer 04/08/2013    Priority: Low  . ALLERGIC RHINITIS 08/06/2007    Priority: Low  . Varicose vein of leg 12/08/2017  . Hearing loss 12/08/2017  . Hemorrhagic prepatellar bursitis of left knee 09/16/2015   Past Surgical History:  Procedure Laterality Date  . APPENDECTOMY    . CATARACT EXTRACTION, BILATERAL Bilateral 2018  . CHOLECYSTECTOMY  05/2009  . prostatectomy  1994   DR.MOHLER [facility]  . TONSILLECTOMY      Family History  Problem Relation Age of Onset  . Prostate cancer Paternal Uncle   . Prostate cancer Cousin   . Pneumonia Father         aspiration  . Other Other 46       smoker. sudden death unclear cause  . Colon cancer Neg Hx     Medications- reviewed and updated Current Outpatient Medications  Medication Sig Dispense Refill  . cetirizine (ZYRTEC) 10 MG tablet Take 10 mg by mouth daily.    [name] guaiFENesin (MUCINEX) 600 MG 12 hr tablet Take by mouth 2 (two) times daily as needed.    . naproxen sodium (ALEVE) 220 MG tablet Take 220 mg by mouth as needed.     No current facility-administered medications for this visit.    Allergies-reviewed and updated No Known Allergies  Social History   Social History Narrative   From [place]    Came down to [employer] for graduate studies- Phd in botany.    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       Married 1985.1st marriage. No kids. No pets.       Hobbies- above + gardening      Objective  Objective:  BP 138/88   Pulse 88   Temp 98.7 F (37.1 C)   Ht 6\' 2"  (1.88 m)   Wt 154 lb 3.2 oz (69.9 kg)   SpO2 95%   BMI 19.80 kg/m  Gen: NAD, resting comfortably HEENT: Mucous membranes are  moist. Oropharynx normal. Prior nasal surgery eevidence.  Neck: no thyromegaly CV: RRR no murmurs rubs or gallops Lungs: CTAB no crackles, wheeze, rhonchi Abdomen: soft/nontender/nondistended/normal bowel sounds. No rebound or guarding.  Ext: no edema Skin: warm, dry Neuro: grossly normal, moves all extremities, PERRLA Right knee with pain along medial joint line and also a positive mcmurrays test (pain and click)      Assessment and Plan  79 y.o. male presenting for annual physical.  Health Maintenance counseling: 1. Anticipatory guidance: Patient counseled regarding regular dental exams q6 months advised, eye exams-yearly- has done well after cataract surgery,  avoiding smoking and second hand smoke , limiting alcohol to 2 beverages per day- usually  does 1 per day- rarely up to 2 2. Risk factor reduction:  Advised patient of need for regular exercise and diet rich and fruits and vegetables to reduce risk of heart attack and stroke. Exercise- cardio dance, eliptical sneakers . Diet-cooks at home.  Down another 2 pounds from last year-recommended no further weight loss Wt Readings from Last 3 Encounters:  02/04/20 154 lb 3.2 oz (69.9 kg)  01/20/20 152 lb (68.9 kg)  04/23/19 159 lb (72.1 kg)  3. Immunizations/screenings/ancillary studies-fully up-to-date.  Recommended influenza vaccination in the fall once available Immunization History  Administered Date(s) Administered  . Hepatitis A, Adult 12/16/1993, 07/28/1994  . Influenza Whole 03/28/1999  . Influenza,inj,Quad PF,6+ Mos 04/03/2013, 03/19/2014  . Influenza-Unspecified 04/24/2015, 03/21/2016, 03/21/2017, 03/25/2018  . PFIZER SARS-COV-2 Vaccination 07/17/2019, 08/07/2019  . Pneumococcal Conjugate-13 06/02/2014  . Pneumococcal Polysaccharide-23 12/07/2006  . Td 12/07/2006  . Tdap 11/04/2015  . Typhoid Inactivated 12/16/1993  . Yellow Fever 12/16/1993  . Zoster 02/23/2010  . Zoster Recombinat (Shingrix) 12/13/2017, 02/21/2018   4. Prostate cancer screening- patient with history of radical prostatectomy.  He follows with Dr. Junious Silk of urology.  Recommended follow-up last year when PSA was elevated.  He previously declined androgen deprivation and salvage radiation-in general he does not want to pursue treatment unless symptomatic-I would still prefer a follow-up with urology. Nocturia 1x a night but at camp was 4x a night but was drinking more.  -strongly encouraged urology follow up  Lab Results  Component Value Date   PSA 16.01 (H) 12/11/2018   PSA 12.69 (H) 12/08/2017   PSA 12.60 08/15/2017   5. Colon cancer screening - last completed in 2016 was told no further colonoscopy due to age 19. Skin cancer screening-follows with Dr. Ronnald Ramp yearly. advised regular sunscreen use. Denies  worrisome, changing, or new skin lesions.  7. Never smoker 8. STD screening - monogamous- no need for std testing.    Status of chronic or acute concerns   # social update- was able to do a dancing camping trip.   #Blood pressure is high normal today-his wife has a blood pressure cuff and he states he can check this at home.  I want him to check once a week for the next 4 weeks and send me a message with how his readings are doing. Goal <140/90  #hyperlipidemia S: Medication: None Lab Results  Component Value Date   CHOL 181 12/11/2018   HDL 74.80 12/11/2018   LDLCALC 96 12/11/2018   TRIG 48.0 12/11/2018   CHOLHDL 2 12/11/2018   A/P: LDL reasonably well controlled at 96 last year-newer guidelines suggest 42 or less-for primary prevention above age 90 typically would not start medication  #Elevated TSH-mild elevation in the past.  Trend TSH  #Bilateral hearing loss-refer to local audiologist last year-using  hearing aids  #Mild to moderate arthritis in the hands-was using naproxen last year.  Recommended Voltaren gel instead. Recent flare in knee naproxen not helpful  in past  #right knee pain- worsened started at camp. No swelling. If gets in certain positions feels like a click. Does not feel like gives way -with positive mcmurray want sports medicine opinion- suspect degenerative meniscal tear and he wants to consider injection  Recommended follow up: Return in about 1 year (around 02/03/2021) for physical or sooner if needed.  Lab/Order associations: not fasting   ICD-10-CM   1. Preventative health care  Z00.00 TSH    PSA    Lipid panel    Comprehensive metabolic panel    CBC with Differential/Platelet    CBC with Differential/Platelet    Comprehensive metabolic panel    Lipid panel    PSA    TSH    CANCELED: CBC with Differential/Platelet    CANCELED: Comprehensive metabolic panel    CANCELED: Lipid panel    CANCELED: PSA    CANCELED: TSH  2. Hyperlipidemia,  unspecified hyperlipidemia type  E78.5 Lipid panel    Comprehensive metabolic panel    CBC with Differential/Platelet    CBC with Differential/Platelet    Comprehensive metabolic panel    Lipid panel    CANCELED: CBC with Differential/Platelet    CANCELED: Comprehensive metabolic panel    CANCELED: Lipid panel  3. Elevated TSH  R79.89 TSH    TSH    CANCELED: TSH  4. Prostate cancer (HCC)  C61 PSA    PSA    CANCELED: PSA  5. Acute pain of right knee  M25.561 Ambulatory referral to Sports Medicine   Return precautions advised.  Garret Reddish, MD

## 2020-02-03 NOTE — Patient Instructions (Addendum)
Please stop by lab before you go If you have mychart- we will send your results within 3 business days of [place] receiving them.  If you do not have mychart- we will call you about results within 5 business days of [place] receiving them.  *please note we are currently using [facility] which has a longer processing time than [place] typically so labs may not come back as quickly as in the past *please also note that you will see labs on mychart as soon as they post. I will later go in and write notes on them- will say "notes from Dr. Yong Channel"  Health Maintenance Due  Topic Date Due  . INFLUENZA VACCINE -please let us know when you have gotten this through volunteering 01/26/2020   #Blood pressure is high normal today-his wife has a blood pressure cuff and he states he can check this at home.  I want him to check once a week for the next 4 weeks and send me a message with how his readings are doing. Goal <140/90. Ideal blood pressure 120/80 or less but under 140/90 we can stay off medicine and work on healthy eating/low salt/regular exercise  For your knee and ands- worth trying voltaren gel up to 4x a day for several days if you have flare ups. If not improving- could refer to sports medicine for further evaluation   I suspect you have a medial meniscus tear. For right knee pain- We will call you within two weeks about your referral to sports medicine. If you do not hear within 3 weeks, give [place] a call.

## 2020-02-04 ENCOUNTER — Encounter: Payer: Self-pay | Admitting: Family Medicine

## 2020-02-04 ENCOUNTER — Ambulatory Visit (INDEPENDENT_AMBULATORY_CARE_PROVIDER_SITE_OTHER): Payer: PPO | Admitting: Family Medicine

## 2020-02-04 ENCOUNTER — Other Ambulatory Visit: Payer: Self-pay

## 2020-02-04 VITALS — BP 138/88 | HR 88 | Temp 98.7°F | Ht 74.0 in | Wt 154.2 lb

## 2020-02-04 DIAGNOSIS — R7989 Other specified abnormal findings of blood chemistry: Secondary | ICD-10-CM

## 2020-02-04 DIAGNOSIS — M25561 Pain in right knee: Secondary | ICD-10-CM | POA: Diagnosis not present

## 2020-02-04 DIAGNOSIS — Z0000 Encounter for general adult medical examination without abnormal findings: Secondary | ICD-10-CM | POA: Diagnosis not present

## 2020-02-04 DIAGNOSIS — E785 Hyperlipidemia, unspecified: Secondary | ICD-10-CM

## 2020-02-04 DIAGNOSIS — C61 Malignant neoplasm of prostate: Secondary | ICD-10-CM | POA: Diagnosis not present

## 2020-02-05 LAB — LIPID PANEL
Cholesterol: 150 mg/dL (ref <200)
HDL: 67 mg/dL (ref >=40)
LDL Cholesterol (Calc): 62 mg/dL (calc)
Non-HDL Cholesterol (Calc): 83 mg/dL (calc) (ref <130)
Total CHOL/HDL Ratio: 2.2 (calc) (ref <5.0)
Triglycerides: 131 mg/dL (ref <150)

## 2020-02-05 LAB — COMPREHENSIVE METABOLIC PANEL
AG Ratio: 1.6 (calc) (ref 1.0–2.5)
ALT: 15 U/L (ref 9–46)
AST: 23 U/L (ref 10–35)
Albumin: 4.2 g/dL (ref 3.6–5.1)
Alkaline phosphatase (APISO): 52 U/L (ref 35–144)
BUN: 17 mg/dL (ref 7–25)
CO2: 25 mmol/L (ref 20–32)
Calcium: 8.9 mg/dL (ref 8.6–10.3)
Chloride: 97 mmol/L — ABNORMAL LOW (ref 98–110)
Creat: 0.99 mg/dL (ref 0.70–1.11)
Globulin: 2.7 g/dL (calc) (ref 1.9–3.7)
Glucose, Bld: 140 mg/dL — ABNORMAL HIGH (ref 65–99)
Potassium: 4.2 mmol/L (ref 3.5–5.3)
Sodium: 130 mmol/L — ABNORMAL LOW (ref 135–146)
Total Bilirubin: 0.9 mg/dL (ref 0.2–1.2)
Total Protein: 6.9 g/dL (ref 6.1–8.1)

## 2020-02-05 LAB — CBC WITH DIFFERENTIAL/PLATELET
Absolute Monocytes: 730 cells/uL (ref 200–950)
Basophils Absolute: 50 cells/uL (ref 0–200)
Basophils Relative: 0.6 %
Eosinophils Absolute: 66 cells/uL (ref 15–500)
Eosinophils Relative: 0.8 %
HCT: 41.9 % (ref 38.5–50.0)
Hemoglobin: 14.4 g/dL (ref 13.2–17.1)
Lymphs Abs: 1917 cells/uL (ref 850–3900)
MCH: 30.8 pg (ref 27.0–33.0)
MCHC: 34.4 g/dL (ref 32.0–36.0)
MCV: 89.5 fL (ref 80.0–100.0)
MPV: 9.2 fL (ref 7.5–12.5)
Monocytes Relative: 8.8 %
Neutro Abs: 5536 cells/uL (ref 1500–7800)
Neutrophils Relative %: 66.7 %
Platelets: 249 10*3/uL (ref 140–400)
RBC: 4.68 10*6/uL (ref 4.20–5.80)
RDW: 12.2 % (ref 11.0–15.0)
Total Lymphocyte: 23.1 %
WBC: 8.3 10*3/uL (ref 3.8–10.8)

## 2020-02-05 LAB — PSA: PSA: 14.8 ng/mL — ABNORMAL HIGH (ref <=4.0)

## 2020-02-05 LAB — TSH: TSH: 5.09 mIU/L — ABNORMAL HIGH (ref 0.40–4.50)

## 2020-02-09 ENCOUNTER — Encounter: Payer: Self-pay | Admitting: Family Medicine

## 2020-02-11 ENCOUNTER — Ambulatory Visit: Payer: PPO | Admitting: Family Medicine

## 2020-02-11 ENCOUNTER — Encounter: Payer: Self-pay | Admitting: Family Medicine

## 2020-02-11 ENCOUNTER — Other Ambulatory Visit: Payer: Self-pay

## 2020-02-11 ENCOUNTER — Ambulatory Visit: Payer: Self-pay

## 2020-02-11 VITALS — BP 130/80 | HR 83 | Ht 74.0 in | Wt 154.8 lb

## 2020-02-11 DIAGNOSIS — M25561 Pain in right knee: Secondary | ICD-10-CM

## 2020-02-11 NOTE — Progress Notes (Signed)
Subjective:    CC: R knee pain  I, Molly Weber, LAT, ATC, am serving as scribe for Dr. Lynne Leader.  HPI: Pt is an 79 y/o male presenting w/ c/o R knee pain x 2 weeks that began when he went to dance camp.  He locates his pain to his R medial knee.  He feels a painful snap at the medial knee with flexion.  He notes he does not recall any specific injury but does note that he was doing more activity than usual.  R knee swelling: No R knee mechanical symptoms: No Aggravating factors: at night when he tries to sleep; stairs Treatments tried: ice, topical pain relieving gel  Pertinent review of Systems: No fevers or chills  Relevant historical information: History of prostate cancer   Objective:    Vitals:   02/11/20 1049  BP: 130/80  Pulse: 83  SpO2: 97%   General: Well Developed, well nourished, and in no acute distress.   MSK: Right knee bossing medial joint line minimal effusion otherwise normal-appearing Normal motion. Tender palpation medial joint line and at medial femoral condyle. Painful palpable pop with knee flexion and motion overlying the medial femoral condyle. Stable ligamentous exam. Negative Murray's test. Intact strength.  Lab and Radiology Results Diagnostic Limited MSK Ultrasound of: Left knee Quad tendon intact.  Increased calcific change at distal insertion at superior patellar pole. Small joint effusion superior patellar space pain Patellar tendon normal-appearing Lateral joint line largely normal. Medial joint line degenerative very narrowed with extruded medial meniscus. Area of tenderness femoral condyle reveals a osteophyte with visible snap with knee flexion of soft tissue. Posterior knee reveals a small Baker's cyst. Impression: Medial compartment DJD with effusion and Baker's cyst.  Painful soft tissue impingement overlying medial femoral condyle.  Procedure: Real-time Ultrasound Guided Injection of right knee medial superior soft  tissue impingement and medial joint Device: Philips Affiniti 50G Images permanently stored and available for review in the ultrasound unit. Verbal informed consent obtained.  Discussed risks and benefits of procedure. Warned about infection bleeding damage to structures skin hypopigmentation and fat atrophy among others. Patient expresses understanding and agreement Time-out conducted.   Noted no overlying erythema, induration, or other signs of local infection.   Skin prepped in a sterile fashion.   Local anesthesia: Topical Ethyl chloride.   With sterile technique and under real time ultrasound guidance:  40 mg of Kenalog and 2 mL of Marcaine injected easily.   Completed without difficulty   Pain immediately resolved suggesting accurate placement of the medication.   Advised to call if fevers/chills, erythema, induration, drainage, or persistent bleeding.   Images permanently stored and available for review in the ultrasound unit.  Impression: Technically successful ultrasound guided injection.         Impression and Recommendations:    Assessment and Plan: 79 y.o. male with right knee pain due to soft tissue impingement and DJD.  Plan for injection as above.  Also recommend Voltaren gel.  Recheck back if not improving.  Discussed precautions.  Patient is increased activity probably cause exacerbation and this should get better with a little bit of rest and treatment as above..   Orders Placed This Encounter  Procedures  . [place] LIMITED JOINT SPACE STRUCTURES LOW RIGHT(NO LINKED CHARGES)    Order Specific Question:   Reason for Exam (SYMPTOM  OR DIAGNOSIS REQUIRED)    Answer:   R knee pain    Order Specific Question:   Preferred imaging  location?    Answer:   [facility]   No orders of the defined types were placed in this encounter.   Discussed warning signs or symptoms. Please see discharge instructions. Patient expresses understanding.   The  above documentation has been reviewed and is accurate and complete Lynne Leader, M.D.

## 2020-02-11 NOTE — Patient Instructions (Signed)
Thank you for coming in today.  Call or go to the ER if you develop a large red swollen joint with extreme pain or oozing puss.   Return sooner if needed.    Plica Syndrome  Plica syndrome is a painful knee condition. Plica syndrome happens when folds of tissue in the knee (plica) get swollen and rub against the kneecap or thigh bone. What are the causes? This condition may be caused by:  Bending or twisting the knee over and over again.  A hit to the knee. What increases the risk? The following factors may make you more likely to develop this condition:  Having hip or thigh muscles that are weak or tight.  Having hip or foot problems that change the normal position of the knee.  Having had a previous knee injury.  Playing contact sports.  Participating in activities that involve making the same knee movements over and over, such as running, cycling, or swimming. What are the signs or symptoms? The main symptom of this condition is a dull pain in the front or side of the knee. The pain comes and goes. It may get better with rest and worse with activities such as:  Standing.  Kneeling.  Walking.  Running.  Climbing stairs. Other symptoms of this condition include:  Swelling of the knee.  Pain when pressing on your knee.  A clicking or snapping feeling when you bend your knee.  A feeling that your knee is locking or catching.  A feeling like your knee is giving way (instability). How is this diagnosed? This condition is diagnosed based on:  Your medical history.  A physical exam. Your health care provider may move your knee in different directions and feel your knee to check for pain and tenderness.  Imaging studies, such as: ? MRI. ? Ultrasound.  A procedure to look inside your knee joint (arthroscopy). How is this treated? This condition may be treated by:  Resting your knee until pain and swelling go down.  Avoiding activities that make pain  worse.  Icing your knee.  Wearing a supportive sleeve around your knee.  Taking medicine to reduce pain and inflammation.  Getting injections in your knee.  Starting exercises to help you regain full movement (physical therapy) and strengthen your thigh muscles. If these treatments do not help after 6 months, you may need to have surgery to remove the swollen parts of your plica. Follow these instructions at home: If you have a sleeve:  Wear it as told by your health care provider. Remove it only as told by your health care provider.  Remove the sleeve if your toes tingle, become numb, or turn cold and blue.  Keep the sleeve clean.  If the sleeve is not waterproof: ? Do not let it get wet. ? Cover it with a waterproof covering when you take a bath or shower. Managing pain, stiffness, and swelling   If directed, put ice on your knee. ? Put ice in a plastic bag. ? Place a towel between your skin and the bag. ? Leave the ice on for 20 minutes, 2-3 times a day.  Raise (elevate) your knee above the level of your heart while you are sitting or lying down. Driving  Ask your health care provider if the medicine prescribed to you requires you to avoid driving or using heavy machinery.  Ask your health care provider when it is safe to drive if you have a sleeve on your knee. Activity  Return to your normal activities as told by your health care provider. Ask your health care provider what activities are safe for you.  Do exercises as told by your health care provider. General instructions  Take over-the-counter and prescription medicines as told by your health care provider.  Do not use any products that contain nicotine or tobacco, such as cigarettes, e-cigarettes, and chewing tobacco. If you need help quitting, ask your health care provider.  Keep all follow-up visits as told by your health care provider. This is important. How is this prevented?  Warm up and stretch  before being active.  Cool down and stretch after being active.  Give your body time to rest between periods of activity.  Maintain physical fitness, including: ? Strength. ? Flexibility.  Be safe and responsible while being active. This will help you to avoid falls. Contact a health care provider if:  Your symptoms get worse.  Your symptoms have not improved after 6 months. Summary  Plica syndrome is a painful knee condition.  Plica syndrome happens when folds of tissue in the knee (plica) get swollen and rub against the kneecap or thigh bone.  This condition may be caused by repeatedly bending or twisting the knee or by a hit to the knee.  This condition is treated with rest, ice, medicines, a supportive sleeve, physical therapy, and surgery if needed. This information is not intended to replace advice given to you by your health care provider. Make sure you discuss any questions you have with your health care provider. Document Revised: 10/04/2018 Document Reviewed: 05/10/2018 Elsevier Patient Education  [place].

## 2020-02-11 NOTE — Telephone Encounter (Signed)
LVM with [place], new patient referral coordinator to give me a call when she receives my message. The referral in proficient still states it is in review. Last comment on it says "working on referral update shortly" - this was 7 days ago. I will call later this afternoon if I do not hear from [place] before then.

## 2020-03-05 DIAGNOSIS — K4090 Unilateral inguinal hernia, without obstruction or gangrene, not specified as recurrent: Secondary | ICD-10-CM | POA: Diagnosis not present

## 2020-03-18 ENCOUNTER — Ambulatory Visit: Payer: Self-pay | Admitting: Surgery

## 2020-03-23 ENCOUNTER — Other Ambulatory Visit: Payer: PPO

## 2020-03-23 DIAGNOSIS — Z20822 Contact with and (suspected) exposure to covid-19: Secondary | ICD-10-CM | POA: Diagnosis not present

## 2020-04-07 ENCOUNTER — Other Ambulatory Visit: Payer: Self-pay

## 2020-04-07 ENCOUNTER — Encounter (HOSPITAL_BASED_OUTPATIENT_CLINIC_OR_DEPARTMENT_OTHER): Payer: Self-pay | Admitting: Surgery

## 2020-04-09 ENCOUNTER — Other Ambulatory Visit (HOSPITAL_COMMUNITY)
Admission: RE | Admit: 2020-04-09 | Discharge: 2020-04-09 | Disposition: A | Discharge status: Home / Self Care | Payer: PPO | Source: Ambulatory Visit | Attending: Surgery | Admitting: Surgery

## 2020-04-09 DIAGNOSIS — Z20822 Contact with and (suspected) exposure to covid-19: Secondary | ICD-10-CM | POA: Insufficient documentation

## 2020-04-09 DIAGNOSIS — Z01812 Encounter for preprocedural laboratory examination: Secondary | ICD-10-CM | POA: Diagnosis not present

## 2020-04-09 LAB — SARS CORONAVIRUS 2 (TAT 6-24 HRS): SARS Coronavirus 2: NEGATIVE

## 2020-04-13 ENCOUNTER — Ambulatory Visit (HOSPITAL_BASED_OUTPATIENT_CLINIC_OR_DEPARTMENT_OTHER)
Admission: RE | Admit: 2020-04-13 | Discharge: 2020-04-13 | Disposition: A | Discharge status: Home / Self Care | Payer: PPO | Attending: Surgery | Admitting: Surgery

## 2020-04-13 ENCOUNTER — Other Ambulatory Visit: Payer: Self-pay

## 2020-04-13 ENCOUNTER — Encounter (HOSPITAL_BASED_OUTPATIENT_CLINIC_OR_DEPARTMENT_OTHER)
Admission: RE | Disposition: A | Discharge status: Home / Self Care | Payer: Self-pay | Source: Home / Self Care | Attending: Surgery

## 2020-04-13 ENCOUNTER — Ambulatory Visit (HOSPITAL_BASED_OUTPATIENT_CLINIC_OR_DEPARTMENT_OTHER): Payer: PPO | Admitting: Certified Registered Nurse Anesthetist

## 2020-04-13 ENCOUNTER — Other Ambulatory Visit (HOSPITAL_BASED_OUTPATIENT_CLINIC_OR_DEPARTMENT_OTHER): Payer: Self-pay | Admitting: Surgery

## 2020-04-13 ENCOUNTER — Encounter (HOSPITAL_BASED_OUTPATIENT_CLINIC_OR_DEPARTMENT_OTHER): Payer: Self-pay | Admitting: Surgery

## 2020-04-13 DIAGNOSIS — K219 Gastro-esophageal reflux disease without esophagitis: Secondary | ICD-10-CM | POA: Diagnosis not present

## 2020-04-13 DIAGNOSIS — E785 Hyperlipidemia, unspecified: Secondary | ICD-10-CM | POA: Diagnosis not present

## 2020-04-13 DIAGNOSIS — Z8546 Personal history of malignant neoplasm of prostate: Secondary | ICD-10-CM | POA: Diagnosis not present

## 2020-04-13 DIAGNOSIS — K4090 Unilateral inguinal hernia, without obstruction or gangrene, not specified as recurrent: Secondary | ICD-10-CM | POA: Insufficient documentation

## 2020-04-13 DIAGNOSIS — D176 Benign lipomatous neoplasm of spermatic cord: Secondary | ICD-10-CM | POA: Diagnosis not present

## 2020-04-13 DIAGNOSIS — M1990 Unspecified osteoarthritis, unspecified site: Secondary | ICD-10-CM | POA: Insufficient documentation

## 2020-04-13 HISTORY — PX: INGUINAL HERNIA REPAIR: SHX194

## 2020-04-13 HISTORY — DX: Unilateral inguinal hernia, without obstruction or gangrene, not specified as recurrent: K40.90

## 2020-04-13 SURGERY — REPAIR, HERNIA, INGUINAL, ADULT
Anesthesia: General | Site: Abdomen | Laterality: Right

## 2020-04-13 MED ORDER — BUPIVACAINE HCL 0.25 % IJ SOLN
INTRAMUSCULAR | Status: DC | PRN
  Administered 2020-04-13: 30 mL

## 2020-04-13 MED ORDER — OXYCODONE HCL 5 MG/5ML PO SOLN: 5.0000 mg | Freq: Once | ORAL | Status: DC | PRN

## 2020-04-13 MED ORDER — EPHEDRINE SULFATE 50 MG/ML IJ SOLN
INTRAMUSCULAR | Status: DC | PRN
  Administered 2020-04-13 (×2): 10 mg via INTRAVENOUS
  Administered 2020-04-13: 15 mg via INTRAVENOUS

## 2020-04-13 MED ORDER — DEXAMETHASONE SODIUM PHOSPHATE 10 MG/ML IJ SOLN
INTRAMUSCULAR | Status: AC
  Filled 2020-04-13: qty 1

## 2020-04-13 MED ORDER — OXYCODONE HCL 5 MG PO TABS: 5.0000 mg | ORAL_TABLET | Freq: Once | ORAL | Status: DC | PRN

## 2020-04-13 MED ORDER — ONDANSETRON HCL 4 MG/2ML IJ SOLN
INTRAMUSCULAR | Status: AC
  Filled 2020-04-13: qty 2

## 2020-04-13 MED ORDER — BUPIVACAINE HCL (PF) 0.25 % IJ SOLN
INTRAMUSCULAR | Status: AC
  Filled 2020-04-13: qty 30

## 2020-04-13 MED ORDER — CEFAZOLIN SODIUM-DEXTROSE 2-4 GM/100ML-% IV SOLN
INTRAVENOUS | Status: AC
  Filled 2020-04-13: qty 100

## 2020-04-13 MED ORDER — PROMETHAZINE HCL 25 MG/ML IJ SOLN: 6.2500 mg | INTRAMUSCULAR | Status: DC | PRN

## 2020-04-13 MED ORDER — LIDOCAINE 2% (20 MG/ML) 5 ML SYRINGE
INTRAMUSCULAR | Status: AC
  Filled 2020-04-13: qty 5

## 2020-04-13 MED ORDER — PROPOFOL 10 MG/ML IV BOLUS
INTRAVENOUS | Status: DC | PRN
  Administered 2020-04-13: 180 mg via INTRAVENOUS

## 2020-04-13 MED ORDER — SUGAMMADEX SODIUM 200 MG/2ML IV SOLN
INTRAVENOUS | Status: DC | PRN
  Administered 2020-04-13: 200 mg via INTRAVENOUS

## 2020-04-13 MED ORDER — ROCURONIUM BROMIDE 100 MG/10ML IV SOLN
INTRAVENOUS | Status: DC | PRN
  Administered 2020-04-13: 70 mg via INTRAVENOUS

## 2020-04-13 MED ORDER — DEXAMETHASONE SODIUM PHOSPHATE 4 MG/ML IJ SOLN
INTRAMUSCULAR | Status: DC | PRN
  Administered 2020-04-13: 5 mg via INTRAVENOUS

## 2020-04-13 MED ORDER — LACTATED RINGERS IV SOLN: INTRAVENOUS | Status: DC

## 2020-04-13 MED ORDER — PHENYLEPHRINE HCL (PRESSORS) 10 MG/ML IV SOLN
INTRAVENOUS | Status: DC | PRN
  Administered 2020-04-13 (×2): 80 ug via INTRAVENOUS
  Administered 2020-04-13: 120 ug via INTRAVENOUS
  Administered 2020-04-13: 80 ug via INTRAVENOUS

## 2020-04-13 MED ORDER — FENTANYL CITRATE (PF) 100 MCG/2ML IJ SOLN
INTRAMUSCULAR | Status: AC
Start: 2020-04-13 — End: ?
  Filled 2020-04-13: qty 2

## 2020-04-13 MED ORDER — AMISULPRIDE (ANTIEMETIC) 5 MG/2ML IV SOLN: 10.0000 mg | Freq: Once | INTRAVENOUS | Status: DC | PRN

## 2020-04-13 MED ORDER — ONDANSETRON HCL 4 MG/2ML IJ SOLN
INTRAMUSCULAR | Status: DC | PRN
  Administered 2020-04-13: 4 mg via INTRAVENOUS

## 2020-04-13 MED ORDER — OXYCODONE HCL 5 MG PO TABS
5.0000 mg | ORAL_TABLET | ORAL | 0 refills | Status: DC | PRN
Start: 2020-04-13 — End: 2020-04-13

## 2020-04-13 MED ORDER — CHLORHEXIDINE GLUCONATE CLOTH 2 % EX PADS: 6.0000 | MEDICATED_PAD | Freq: Once | CUTANEOUS | Status: DC

## 2020-04-13 MED ORDER — FENTANYL CITRATE (PF) 100 MCG/2ML IJ SOLN
INTRAMUSCULAR | Status: DC | PRN
Start: 2020-04-13 — End: 2020-04-13
  Administered 2020-04-13 (×2): 50 ug via INTRAVENOUS

## 2020-04-13 MED ORDER — HYDROMORPHONE HCL 1 MG/ML IJ SOLN: 0.2500 mg | INTRAMUSCULAR | Status: DC | PRN

## 2020-04-13 MED ORDER — PROPOFOL 10 MG/ML IV BOLUS
INTRAVENOUS | Status: AC
  Filled 2020-04-13: qty 20

## 2020-04-13 MED ORDER — MIDAZOLAM HCL 2 MG/2ML IJ SOLN
INTRAMUSCULAR | Status: AC
  Filled 2020-04-13: qty 2

## 2020-04-13 MED ORDER — PHENYLEPHRINE 40 MCG/ML (10ML) SYRINGE FOR IV PUSH (FOR BLOOD PRESSURE SUPPORT)
PREFILLED_SYRINGE | INTRAVENOUS | Status: AC
  Filled 2020-04-13: qty 10

## 2020-04-13 MED ORDER — DOCUSATE SODIUM 100 MG PO CAPS: 100.0000 mg | ORAL_CAPSULE | Freq: Every day | ORAL | 0 refills | Status: AC | PRN

## 2020-04-13 MED ORDER — EPHEDRINE 5 MG/ML INJ
INTRAVENOUS | Status: AC
  Filled 2020-04-13: qty 10

## 2020-04-13 MED ORDER — CEFAZOLIN SODIUM-DEXTROSE 2-4 GM/100ML-% IV SOLN
2.0000 g | INTRAVENOUS | Status: AC
  Administered 2020-04-13: 2 g via INTRAVENOUS

## 2020-04-13 MED ORDER — LIDOCAINE HCL (CARDIAC) PF 100 MG/5ML IV SOSY
PREFILLED_SYRINGE | INTRAVENOUS | Status: DC | PRN
  Administered 2020-04-13: 60 mg via INTRAVENOUS

## 2020-04-13 MED FILL — oxyCODONE HCL 5 MG TABS: 5 | 4 days supply | Qty: 20 | Fill #0

## 2020-04-13 SURGICAL SUPPLY — 37 items
ADH SKN CLS APL DERMABOND .7 (GAUZE/BANDAGES/DRESSINGS) ×1
APL PRP STRL LF DISP 70% ISPRP (MISCELLANEOUS) ×1
BLADE SURG 15 STRL LF DISP TIS (BLADE) ×1 IMPLANT
BLADE SURG 15 STRL SS (BLADE) ×2
CHLORAPREP W/TINT 26 (MISCELLANEOUS) ×2 IMPLANT
COVER BACK TABLE 60X90IN (DRAPES) ×2 IMPLANT
COVER SURGICAL LIGHT HANDLE (MISCELLANEOUS) ×2 IMPLANT
COVER WAND RF STERILE (DRAPES) IMPLANT
DECANTER SPIKE VIAL GLASS SM (MISCELLANEOUS) ×1 IMPLANT
DERMABOND ADVANCED (GAUZE/BANDAGES/DRESSINGS) ×1
DERMABOND ADVANCED .7 DNX12 (GAUZE/BANDAGES/DRESSINGS) ×1 IMPLANT
DRAIN PENROSE 1/2X12 LTX STRL (WOUND CARE) ×2 IMPLANT
DRAPE LAPAROSCOPIC ABDOMINAL (DRAPES) ×2 IMPLANT
DRAPE UTILITY 15X26 TOWEL STRL (DRAPES) ×2 IMPLANT
DRAPE UTILITY XL STRL (DRAPES) ×2 IMPLANT
GLOVE BIO SURGEON STRL SZ 6 (GLOVE) ×2 IMPLANT
GLOVE BIOGEL PI IND STRL 6 (GLOVE) ×1 IMPLANT
GLOVE BIOGEL PI INDICATOR 6 (GLOVE) ×1
GLOVE SS PI  5.5 STRL (GLOVE) ×2
GLOVE SS PI 5.5 STRL (GLOVE) ×1 IMPLANT
GOWN STRL REUS W/TWL LRG LVL3 (GOWN DISPOSABLE) ×2 IMPLANT
GOWN STRL REUS W/TWL XL LVL3 (GOWN DISPOSABLE) ×2 IMPLANT
KIT BASIN OR (CUSTOM PROCEDURE TRAY) ×2 IMPLANT
MESH ULTRAPRO 3X6 7.6X15CM (Mesh General) ×1 IMPLANT
NEEDLE HYPO 22GX1.5 SAFETY (NEEDLE) IMPLANT
PENCIL SMOKE EVAC W/HOLSTER (ELECTROSURGICAL) IMPLANT
SPONGE LAP 4X18 RFD (DISPOSABLE) ×2 IMPLANT
SUT MNCRL AB 4-0 PS2 18 (SUTURE) ×3 IMPLANT
SUT PDS AB 2-0 CT2 27 (SUTURE) ×5 IMPLANT
SUT SILK 3 0 (SUTURE)
SUT SILK 3-0 18XBRD TIE 12 (SUTURE) IMPLANT
SUT VIC AB 3-0 SH 27 (SUTURE) ×8
SUT VIC AB 3-0 SH 27X BRD (SUTURE) ×3 IMPLANT
SUT VIC AB 3-0 SH 27XBRD (SUTURE) ×3 IMPLANT
SYR BULB IRRIG 60ML STRL (SYRINGE) ×2 IMPLANT
SYR CONTROL 10ML LL (SYRINGE) ×1 IMPLANT
TOWEL GREEN STERILE FF (TOWEL DISPOSABLE) ×2 IMPLANT

## 2020-04-13 NOTE — Op Note (Signed)
Date: 04/13/20  Patient: Juan Lin MRN: [id number]  Preoperative Diagnosis: Right inguinal hernia Postoperative Diagnosis: Indirect right inguinal hernia  Procedure: Open right inguinal hernia repair with mesh patch  Surgeon: Michaelle Birks, MD Assistant: Eliot Ford, MD PGY-3  EBL: Minimal  Anesthesia: General  Specimens: None  Indications: Juan Lin is an 79 yo male who presented with a reducible but symptomatic right inguinal hernia. After an extensive discussion of the risks and benefits of surgery he agreed to proceed with open repair.  Findings: Indirect right inguinal hernia sac, containing colon. Ligation of the sac was performed. Hernia repaired with an Ultrapro mesh patch.  Procedure details: Informed consent was obtained in the preoperative area prior to the procedure. The patient was brought to the operating room and placed on the table in the supine position. General anesthesia was induced and appropriate lines and drains were placed for intraoperative monitoring. Perioperative antibiotics were administered per SCIP guidelines. The right groin was prepped and draped in the usual sterile fashion. A pre-procedure timeout was taken verifying patient identity, surgical site and procedure to be performed.  A transverse incision was made in the right groin two fingerbreadths superior to the pubic tubercle. The subcutaneous tissue and Scarpa's fascia were divided with cautery to expose the external oblique fascia. The fascia was nicked with a 15-blade scalpel and opened medially to the external inguinal ring using metzenbaum scissors, taking care to protect the ilioinguinal nerve. The cord structures were circumferentially dissected out using gentle blunt dissection, and encircled with a penrose. The ilioinguinal nerve was visualized and protected. An indirect hernia sac was identified and dissected off the cord structures using blunt dissection and cautery. The sac was  opened, and a loop of colon was visualized within the sac, and forming a portion of a wall of the sac, consisting with a sliding hernia. The sac was ligated with a 3-0 vicryl suture, taking care not to injure the colon. The sac and its contents were then reduced into the abdomen. A cord lipoma was identified, carefully dissected off the cord structures, and excised. A sheet of Ultrapro mesh was then brought onto the field and cut to size, with a slit to accommodate the cord structures. The mesh was secured medially to the pubic tubercle and inferiorly to the shelving edge of the inguinal ligament, using a running 2-0 PDS suture. Superiorly the mesh was secured to the conjoint tendon using interrupted 2-0 PDS. The tails of the mesh were sutured together laterally to recreate the internal inguinal ring. The surgical site was thoroughly irrigated with saline and appeared hemostatic. The external oblique fascia was closed with a running 3-0 Vicryl suture. Scarpa's fascia was closed a running 3-0 Vicryl, and the skin was closed with a running subcuticular 4-0 monocryl suture. Dermabond was applied.  All counts were correct x2 at the end of the procedure. The patient was extubated and taken to PACU in stable condition.  Michaelle Birks, MD 04/13/20 11:02 AM

## 2020-04-13 NOTE — H&P (Signed)
Juan Lin is an 79 y.o. male.   Chief Complaint: inguinal hernia HPI: Juan Lin is an 79 yo male with a symptomatic right inguinal hernia. The hernia has been enlargening and bothers him when he coughs or lifts something. He has been using a belt to keep it reduced. He presents today for surgery. He denies any recent changes to his medical history. COVID test was negative on 10/14.  Past Medical History:  Diagnosis Date  . Adenoma 2008   serrated adenoma of colon  . Diverticulosis   . GERD (gastroesophageal reflux disease)    H. Pylori related-gone when treated  . Hyperlipidemia   . Prostate cancer [facility]) 1994   radical prostatectomy Dr. Gentry Fitz  . Right inguinal hernia   . Seasonal allergies   . Sinusitis acute     Past Surgical History:  Procedure Laterality Date  . APPENDECTOMY    . CATARACT EXTRACTION, BILATERAL Bilateral 2018  . CHOLECYSTECTOMY  05/2009  . prostatectomy  1994   DR.MOHLER [facility]  . TONSILLECTOMY      Family History  Problem Relation Age of Onset  . Prostate cancer Paternal Uncle   . Prostate cancer Cousin   . Pneumonia Father        aspiration  . Other Other 46       smoker. sudden death unclear cause  . Colon cancer Neg Hx    Social History:  reports that he has never smoked. He has never used smokeless tobacco. He reports current alcohol use of about 7.0 standard drinks of alcohol per week. He reports current drug use. Drugs: Amyl nitrate and IV.  Allergies: No Known Allergies  No medications prior to admission.    No results found for this or any previous visit (from the past 48 hour(s)). No results found.  Review of Systems  Constitutional: Negative for chills and fever.  Respiratory: Negative for cough and shortness of breath.   Cardiovascular: Negative for chest pain.  Gastrointestinal: Positive for constipation. Negative for nausea and vomiting.  Genitourinary: Negative for difficulty urinating.  Allergic/Immunologic:  Negative for immunocompromised state.  Neurological: Negative for speech difficulty and weakness.  Psychiatric/Behavioral: Negative for agitation and confusion.    Height 6\' 2"  (1.88 m), weight 72.6 kg. Physical Exam Constitutional:      General: He is not in acute distress.    Appearance: Normal appearance.  HENT:     Head: Normocephalic and atraumatic.     Nose: Nose normal.     Mouth/Throat:     Pharynx: Oropharynx is clear.  Eyes:     General: No scleral icterus.    Extraocular Movements: Extraocular movements intact.  Pulmonary:     Effort: Pulmonary effort is normal. No respiratory distress.  Abdominal:     General: Abdomen is flat. There is no distension.  Genitourinary:    Comments: Reducible right inguinal hernia Musculoskeletal:        General: Normal range of motion.     Cervical back: Normal range of motion.  Skin:    General: Skin is warm and dry.     Coloration: Skin is not jaundiced.  Neurological:     General: No focal deficit present.     Mental Status: He is alert and oriented to person, place, and time.  Psychiatric:        Mood and Affect: Mood normal.        Behavior: Behavior normal.      Assessment/Plan 79 yo male with a  symptomatic right inguinal hernia. Proceed to OR for open repair with mesh. Discharge home postoperatively from PACU. Informed consent obtained. Surgical site confirmed and marked.  Dwan Bolt, MD 04/13/2020, 7:11 AM

## 2020-04-13 NOTE — Anesthesia Procedure Notes (Signed)
Procedure Name: Intubation Date/Time: 04/13/2020 9:56 AM Performed by: Bufford Spikes, CRNA Pre-anesthesia Checklist: Patient identified, Emergency Drugs available, Suction available and Patient being monitored Patient Re-evaluated:Patient Re-evaluated prior to induction Oxygen Delivery Method: Circle system utilized Preoxygenation: Pre-oxygenation with 100% oxygen Induction Type: IV induction Ventilation: Mask ventilation without difficulty Laryngoscope Size: Miller and 2 Tube type: Oral Tube size: 7.0 mm Number of attempts: 1 Airway Equipment and Method: Stylet and Oral airway Placement Confirmation: ETT inserted through vocal cords under direct vision,  positive ETCO2 and breath sounds checked- equal and bilateral Secured at: 21 cm Tube secured with: Tape Dental Injury: Teeth and Oropharynx as per pre-operative assessment

## 2020-04-13 NOTE — Transfer of Care (Signed)
Immediate Anesthesia Transfer of Care Note  Patient: Juan Lin  Procedure(s) Performed: OPEN RIGHT INGUINAL HERNIA REPAIR (Right Abdomen)  Patient Location: PACU  Anesthesia Type:General  Level of Consciousness: awake, alert  and oriented  Airway & Oxygen Therapy: Patient Spontanous Breathing and Patient connected to nasal cannula oxygen  Post-op Assessment: Report given to RN and Post -op Vital signs reviewed and stable  Post vital signs: Reviewed and stable  Last Vitals:  Vitals Value Taken Time  BP 135/88 04/13/20 1109  Temp    Pulse 79 04/13/20 1120  Resp 14 04/13/20 1120  SpO2 100 % 04/13/20 1120  Vitals shown include unvalidated device data.  Last Pain:  Vitals:   04/13/20 0832  TempSrc: Oral  PainSc: 0-No pain      Patients Stated Pain Goal: 3 (80/88/11 0315)  Complications: No complications documented.

## 2020-04-13 NOTE — Anesthesia Postprocedure Evaluation (Signed)
Anesthesia Post Note  Patient: Juan Lin  Procedure(s) Performed: OPEN RIGHT INGUINAL HERNIA REPAIR (Right Abdomen)     Patient location during evaluation: PACU Anesthesia Type: General Level of consciousness: awake and alert Pain management: pain level controlled Vital Signs Assessment: post-procedure vital signs reviewed and stable Respiratory status: spontaneous breathing, nonlabored ventilation and respiratory function stable Cardiovascular status: blood pressure returned to baseline and stable Postop Assessment: no apparent nausea or vomiting Anesthetic complications: no   No complications documented.  Last Vitals:  Vitals:   04/13/20 1200 04/13/20 1213  BP: 130/85 (!) 141/91  Pulse: 74 71  Resp: (!) 23 20  Temp:  36.6 C  SpO2: 98% 98%    Last Pain:  Vitals:   04/13/20 1213  TempSrc:   PainSc: 2                  Lynda Rainwater

## 2020-04-13 NOTE — Anesthesia Preprocedure Evaluation (Signed)
Anesthesia Evaluation  Patient identified by MRN, date of birth, ID band Patient awake    Reviewed: Allergy & Precautions, NPO status , Patient's Chart, lab work & pertinent test results  Airway Mallampati: II  TM Distance: >3 FB Neck ROM: Full    Dental no notable dental hx.    Pulmonary neg pulmonary ROS,    Pulmonary exam normal breath sounds clear to auscultation       Cardiovascular negative cardio ROS Normal cardiovascular exam Rhythm:Regular Rate:Normal     Neuro/Psych negative neurological ROS  negative psych ROS   GI/Hepatic Neg liver ROS, GERD  ,  Endo/Other  negative endocrine ROS  Renal/GU negative Renal ROS  negative genitourinary   Musculoskeletal  (+) Arthritis , Osteoarthritis,    Abdominal   Peds negative pediatric ROS (+)  Hematology negative hematology ROS (+)   Anesthesia Other Findings   Reproductive/Obstetrics negative OB ROS                             Anesthesia Physical Anesthesia Plan  ASA: II  Anesthesia Plan: General   Post-op Pain Management:    Induction: Intravenous  PONV Risk Score and Plan: 2 and Ondansetron, Midazolam and Treatment may vary due to age or medical condition  Airway Management Planned: Oral ETT  Additional Equipment:   Intra-op Plan:   Post-operative Plan: Extubation in OR  Informed Consent: I have reviewed the patients History and Physical, chart, labs and discussed the procedure including the risks, benefits and alternatives for the proposed anesthesia with the patient or authorized representative who has indicated his/her understanding and acceptance.     Dental advisory given  Plan Discussed with: CRNA  Anesthesia Plan Comments:         Anesthesia Quick Evaluation

## 2020-04-13 NOTE — Discharge Instructions (Signed)
SURGERY DISCHARGE INSTRUCTIONS  Activity . No heavy lifting greater than 10 pounds for 6 weeks after surgery. [name] Ok to shower in 48 hours after surgery, but do not bathe or submerge incisions underwater. . Do not drive while taking narcotic pain medication.  Wound Care . Your incision is covered with skin glue called Dermabond. This will peel off on its own over time. . You may shower in 48 hours and allow warm soapy water to run over your incisions. Gently pat dry. . Do not submerge your incision underwater. . Monitor your incision for any new redness, tenderness, or drainage. . You may not scrotal swelling - this is normal after inguinal hernia repair and will improve over time. You may apply ice packs as needed for the first few days after surgery to minimize swelling.  When to Call us: [name] Fever greater than 100.5 . New redness, drainage, or swelling at incision site . Severe pain, nausea, or vomiting . Difficulty urinating  Follow-up You have an appointment scheduled with Dr. Zenia Resides on May 01, 2020 at 2pm. This will be at the [facility] office at [address] [place], [place], [place]. Please arrive at least 15 minutes prior to your scheduled appointment time.  For questions or concerns, please call the office at [phone].    Post Anesthesia Home Care Instructions  Activity: Get plenty of rest for the remainder of the day. A responsible individual must stay with you for 24 hours following the procedure.  For the next 24 hours, DO NOT: -Drive a car -Paediatric nurse -Drink alcoholic beverages -Take any medication unless instructed by your physician -Make any legal decisions or sign important papers.  Meals: Start with liquid foods such as gelatin or soup. Progress to regular foods as tolerated. Avoid greasy, spicy, heavy foods. If nausea and/or vomiting occur, drink only clear liquids until the nausea and/or vomiting subsides. Call your physician  if vomiting continues.  Special Instructions/Symptoms: Your throat may feel dry or sore from the anesthesia or the breathing tube placed in your throat during surgery. If this causes discomfort, gargle with warm salt water. The discomfort should disappear within 24 hours.  If you had a scopolamine patch placed behind your ear for the management of post- operative nausea and/or vomiting:  1. The medication in the patch is effective for 72 hours, after which it should be removed.  Wrap patch in a tissue and discard in the trash. Wash hands thoroughly with soap and water. 2. You may remove the patch earlier than 72 hours if you experience unpleasant side effects which may include dry mouth, dizziness or visual disturbances. 3. Avoid touching the patch. Wash your hands with soap and water after contact with the patch.

## 2020-04-15 ENCOUNTER — Encounter (HOSPITAL_BASED_OUTPATIENT_CLINIC_OR_DEPARTMENT_OTHER): Payer: Self-pay | Admitting: Surgery

## 2020-05-27 ENCOUNTER — Telehealth: Payer: Self-pay | Admitting: Family Medicine

## 2020-05-27 NOTE — Telephone Encounter (Signed)
Pt wife will have pt call back to schedule Medicare Annual Wellness Visit (AWV) either virtually OR in office.   Last AWV 04/23/19; please schedule at anytime with [facility]-Nurse Health Advisor at [facility].  This should be a 45 minute visit.

## 2020-06-04 ENCOUNTER — Ambulatory Visit (INDEPENDENT_AMBULATORY_CARE_PROVIDER_SITE_OTHER): Payer: PPO

## 2020-06-04 DIAGNOSIS — Z0000 Encounter for general adult medical examination without abnormal findings: Secondary | ICD-10-CM

## 2020-06-04 NOTE — Progress Notes (Signed)
Virtual Visit via Telephone Note  I connected with  Juan Lin on 06/04/20 at  2:30 PM EST by telephone and verified that I am speaking with the correct person using two identifiers.  Medicare Annual Wellness visit completed telephonically due to Covid-19 pandemic.   Persons participating in this call: This Health Coach and this patient and wife Juan Lin  Location: Patient: Home  Provider: Office   I discussed the limitations, risks, security and privacy concerns of performing an evaluation and management service by telephone and the availability of in person appointments. The patient expressed understanding and agreed to proceed.  Unable to perform video visit due to video visit attempted and failed and/or patient does not have video capability.   Some vital signs may be absent or patient reported.   Willette Brace, LPN    Subjective:   Juan Lin is a 79 y.o. male who presents for Medicare Annual/Subsequent preventive examination.  Review of Systems     Cardiac Risk Factors include: dyslipidemia;male gender;advanced age (>34men, >46 women)     Objective:    There were no vitals filed for this visit. There is no height or weight on file to calculate BMI.  Advanced Directives 06/04/2020 04/13/2020 04/07/2020 04/23/2019 10/04/2017 06/17/2016 08/01/2014  Does Patient Have a Medical Advance Directive? Yes Yes Yes Yes Yes Yes Yes  Type of Paramedic of [place];Living will Out of facility DNR (pink MOST or yellow form) [place];Living will Living will;Healthcare Power of Attorney Living will;Healthcare Power of [place];Living will [place];Living will  Does patient want to make changes to medical advance directive? - No - Patient declined No - Patient declined No - Patient declined No - Patient declined - -  Copy of [place] in Chart? No - copy requested - No -  copy requested No - copy requested No - copy requested - No - copy requested    Current Medications (verified) Outpatient Encounter Medications as of 06/04/2020  Medication Sig  . cetirizine (ZYRTEC) 10 MG tablet Take 10 mg by mouth daily.  [name] docusate sodium (COLACE) 100 MG capsule Take 1 capsule (100 mg total) by mouth daily as needed.  [name] guaiFENesin (MUCINEX) 600 MG 12 hr tablet Take by mouth 2 (two) times daily as needed.  . Multiple Vitamin (MULTIVITAMIN WITH MINERALS) TABS tablet Take 1 tablet by mouth daily.  . naproxen sodium (ALEVE) 220 MG tablet Take 220 mg by mouth as needed.  . Omega-3 Fatty Acids (FISH OIL) 1000 MG CAPS Take by mouth.   No facility-administered encounter medications on file as of 06/04/2020.    Allergies (verified) Patient has no known allergies.   History: Past Medical History:  Diagnosis Date  . Adenoma 2008   serrated adenoma of colon  . Diverticulosis   . GERD (gastroesophageal reflux disease)    H. Pylori related-gone when treated  . Hyperlipidemia   . Prostate cancer [facility]) 1994   radical prostatectomy Dr. Gentry Fitz  . Right inguinal hernia   . Seasonal allergies   . Sinusitis acute    Past Surgical History:  Procedure Laterality Date  . APPENDECTOMY    . CATARACT EXTRACTION, BILATERAL Bilateral 2018  . CHOLECYSTECTOMY  05/2009  . INGUINAL HERNIA REPAIR Right 04/13/2020   Procedure: OPEN RIGHT INGUINAL HERNIA REPAIR;  Surgeon: Dwan Bolt, MD;  Location: [facility];  Service: General;  Laterality: Right;  . prostatectomy  1994   DR.MOHLER [facility]  . TONSILLECTOMY     Family History  Problem Relation Age of Onset  . Prostate cancer Paternal Uncle   . Prostate cancer Cousin   . Pneumonia Father        aspiration  . Other Other 46       smoker. sudden death unclear cause  . Colon cancer Neg Hx    Social History   Socioeconomic History  . Marital status: Married    Spouse name: Not on file  . Number of children:  Not on file  . Years of education: Not on file  . Highest education level: Not on file  Occupational History    Comment: currently volunteers as musician  Tobacco Use  . Smoking status: Never Smoker  . Smokeless tobacco: Never Used  Vaping Use  . Vaping Use: Never used  Substance and Sexual Activity  . Alcohol use: Yes    Alcohol/week: 7.0 standard drinks    Types: 7 Standard drinks or equivalent per week    Comment: glass wine with dinner  . Drug use: Yes    Types: Amyl nitrate, IV  . Sexual activity: Not on file  Other Topics Concern  . Not on file  Social History Narrative   From [place]    Came down to [employer] for graduate studies- Phd in botany.    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       Married 1985.1st marriage. No kids. No pets.       Hobbies- above + gardening      Social Determinants of Health   Financial Resource Strain: Low Risk   . Difficulty of Paying Living Expenses: Not hard at all  Food Insecurity: No Food Insecurity  . Worried About Charity fundraiser in the Last Year: Never true  . Ran Out of Food in the Last Year: Never true  Transportation Needs: No Transportation Needs  . Lack of Transportation (Medical): No  . Lack of Transportation (Non-Medical): No  Physical Activity: Sufficiently Active  . Days of Exercise per Week: 6 days  . Minutes of Exercise per Session: 60 min  Stress: No Stress Concern Present  . Feeling of Stress : Not at all  Social Connections: Socially Isolated  . Frequency of Communication with Friends and Family: Once a week  . Frequency of Social Gatherings with Friends and Family: Never  . Attends Religious Services: Never  . Active Member of Clubs or Organizations: No  . Attends Archivist Meetings: Never  . Marital Status: Married    Tobacco Counseling Counseling given: Not  Answered   Clinical Intake:  Pre-visit preparation completed: Yes  Pain : No/denies pain Pain Location: Finger (Comment which one) (just annoying that fingers don't move fast anymore) Pain Descriptors / Indicators: Aching Pain Onset: More than a month ago Pain Frequency: Intermittent     BMI - recorded: 21.36 Nutritional Status: BMI of 19-24  Normal Diabetes: No  How often do you need to have someone help you when you read instructions, pamphlets, or other written materials from your doctor or pharmacy?: 1 - Never  Diabetic?No  Interpreter Needed?: No  Information entered by :: Charlott Rakes, LPN   Activities of Daily Living In your present state of health, do you have any difficulty performing the following activities: 06/04/2020 04/13/2020  Hearing?  Y N  Vision? N N  Difficulty concentrating or making decisions? Y N  Comment memory at times -  Walking or climbing stairs? N N  Dressing or bathing? N N  Doing errands, shopping? N -  Preparing Food and eating ? N -  Using the Toilet? N -  In the past six months, have you accidently leaked urine? N -  Do you have problems with loss of bowel control? N -  Managing your Medications? N -  Managing your Finances? N -  Housekeeping or managing your Housekeeping? N -  Some recent data might be hidden    Patient Care Team: Marin Olp, MD as PCP - General (Family Medicine) Alanda Slim, Neena Rhymes, MD as Consulting Physician (Ophthalmology) Festus Aloe, MD as Consulting Physician (Urology) Sherilyn Banker, Bevely Palmer, MD as Consulting Physician (Audiology)  Indicate any recent Medical Services you may have received from other than [facility] providers in the past year (date may be approximate).     Assessment:   This is a routine wellness examination for Juan Lin.  Hearing/Vision screen  Hearing Screening   125Hz  250Hz  500Hz  1000Hz  2000Hz  3000Hz  4000Hz  6000Hz  8000Hz   Right ear:           Left ear:           Comments: Pt  has hearing aids  Vision Screening Comments: Pt follows Dr Teodoro Spray for annual eye exams  Dietary issues and exercise activities discussed: Current Exercise Habits: Home exercise routine;Structured exercise class, Type of exercise: walking (silver sneakers, dance and zumba), Time (Minutes): > 60, Frequency (Times/Week): 6, Weekly Exercise (Minutes/Week): 0  Goals    . 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 Patient Stated     Continue with exercises       Depression Screen PHQ 2/9 Scores 06/04/2020 02/04/2020 04/23/2019 12/11/2018 10/04/2017 06/17/2016 07/09/2015  PHQ - 2 Score 0 0 0 0 0 0 0  PHQ- 9 Score - 0 - - 0 - -    Fall Risk Fall Risk  06/04/2020 04/23/2019 12/11/2018 10/04/2017 06/17/2016  Falls in the past year? 0 0 0 No No  Number falls in past yr: 0 - - - -  Injury with Fall? 0 0 - - -  Risk for fall due to : Impaired vision - - - -  Follow up Falls prevention discussed Education provided;Falls prevention discussed;Falls evaluation completed - - -    FALL RISK PREVENTION PERTAINING TO THE HOME:  Any stairs in or around the home? Yes  If so, are there any without handrails? No  Home free of loose throw rugs in walkways, pet beds, electrical cords, etc? Yes  Adequate lighting in your home to reduce risk of falls? Yes   ASSISTIVE DEVICES UTILIZED TO PREVENT FALLS:  Life alert? No  Use of a cane, walker or w/c? No  Grab bars in the bathroom? No  Shower chair or bench in shower? No  Elevated toilet seat or a handicapped toilet? Yes   TIMED UP AND GO:  Was the test performed? No .     Cognitive Function: MMSE - Mini Mental State Exam 06/17/2016  Not completed: (No Data)     6CIT Screen 06/04/2020 04/23/2019  What Year? 0 points 0 points  What month? 0 points 0 points  What time? - 0 points  Count back from 20 0 points 0 points  Months in reverse 0  points 0 points  Repeat phrase 0 points 0 points  Total Score - 0    Immunizations Immunization History  Administered Date(s) Administered  . Hepatitis A, Adult 12/16/1993, 07/28/1994  . Influenza Whole 03/28/1999  . Influenza,inj,Quad PF,6+ Mos 04/03/2013, 03/19/2014  . Influenza-Unspecified 03/21/2011, 04/24/2015, 03/21/2016, 03/21/2017, 03/25/2018, 03/19/2020  . PFIZER SARS-COV-2 Vaccination 07/17/2019, 08/07/2019, 03/25/2020  . Pneumococcal Conjugate-13 06/02/2014  . Pneumococcal Polysaccharide-23 12/07/2006  . Td 12/07/2006  . Tdap 11/04/2015  . Typhoid Inactivated 12/16/1993  . Yellow Fever 12/16/1993  . Zoster 02/23/2010  . Zoster Recombinat (Shingrix) 12/13/2017, 02/21/2018    TDAP status: Up to date  Flu Vaccine status: Up to date  Done 03/19/20 Pneumococcal vaccine status: Up to date  Covid-19 vaccine status: Completed vaccines  Qualifies for Shingles Vaccine? Yes   Zostavax completed Yes   Shingrix Completed?: Yes  Screening Tests Health Maintenance  Topic Date Due  . COVID-19 Vaccine (4 - Booster for Pfizer series) 09/22/2020  . TETANUS/TDAP  11/03/2025  . INFLUENZA VACCINE  Completed  . PNA vac Low Risk Adult  Completed    Health Maintenance  There are no preventive care reminders to display for this patient.  Colorectal cancer screening: No longer required.    Additional Screening:  Vision Screening: Recommended annual ophthalmology exams for early detection of glaucoma and other disorders of the eye. Is the patient up to date with their annual eye exam?  Yes  Who is the provider or what is the name of the office in which the patient attends annual eye exams? Dr Sabra Heck  Dental Screening: Recommended annual dental exams for proper oral hygiene  Community Resource Referral / Chronic Care Management: CRR required this visit?  No   CCM required this visit?  No      Plan:     I have personally reviewed and noted the following in the  patient's chart:   . Medical and social history . Use of alcohol, tobacco or illicit drugs  . Current medications and supplements . Functional ability and status . Nutritional status . Physical activity . Advanced directives . List of other physicians . Hospitalizations, surgeries, and ER visits in previous 12 months . Vitals . Screenings to include cognitive, depression, and falls . Referrals and appointments  In addition, I have reviewed and discussed with patient certain preventive protocols, quality metrics, and best practice recommendations. A written personalized care plan for preventive services as well as general preventive health recommendations were provided to patient.     Willette Brace, LPN   42/12/621   Nurse Notes: None

## 2020-06-04 NOTE — Patient Instructions (Addendum)
Juan Lin , Thank you for taking time to come for your Medicare Wellness Visit. I appreciate your ongoing commitment to your health goals. Please review the following plan we discussed and let me know if I can assist you in the future.   Screening recommendations/referrals: Colonoscopy: Done 08/15/14 Recommended yearly ophthalmology/optometry visit for glaucoma screening and checkup Recommended yearly dental visit for hygiene and checkup  Vaccinations: Influenza vaccine: Done 03/19/20 Up to date Pneumococcal vaccine: Up to date Tdap vaccine: Up to date Shingles vaccine: Completed 6/19 & 02/21/18   Covid-19: Completed 1/20 & 08/07/19, 03/25/20  Advanced directives: Please bring a copy of your health care power of attorney and living will to the office at your convenience.  Conditions/risks identified: Keep exercising  Next appointment: Follow up in one year for your annual wellness visit.   Preventive Care 81 Years and Older, Male Preventive care refers to lifestyle choices and visits with your health care provider that can promote health and wellness. What does preventive care include?  A yearly physical exam. This is also called an annual well check.  Dental exams once or twice a year.  Routine eye exams. Ask your health care provider how often you should have your eyes checked.  Personal lifestyle choices, including:  Daily care of your teeth and gums.  Regular physical activity.  Eating a healthy diet.  Avoiding tobacco and drug use.  Limiting alcohol use.  Practicing safe sex.  Taking low doses of aspirin every day.  Taking vitamin and mineral supplements as recommended by your health care provider. What happens during an annual well check? The services and screenings done by your health care provider during your annual well check will depend on your age, overall health, lifestyle risk factors, and family history of disease. Counseling  Your health care provider  may ask you questions about your:  Alcohol use.  Tobacco use.  Drug use.  Emotional well-being.  Home and relationship well-being.  Sexual activity.  Eating habits.  History of falls.  Memory and ability to understand (cognition).  Work and work Statistician. Screening  You may have the following tests or measurements:  Height, weight, and BMI.  Blood pressure.  Lipid and cholesterol levels. These may be checked every 5 years, or more frequently if you are over 39 years old.  Skin check.  Lung cancer screening. You may have this screening every year starting at age 37 if you have a 30-pack-year history of smoking and currently smoke or have quit within the past 15 years.  Fecal occult blood test (FOBT) of the stool. You may have this test every year starting at age 38.  Flexible sigmoidoscopy or colonoscopy. You may have a sigmoidoscopy every 5 years or a colonoscopy every 10 years starting at age 50.  Prostate cancer screening. Recommendations will vary depending on your family history and other risks.  Hepatitis C blood test.  Hepatitis B blood test.  Sexually transmitted disease (STD) testing.  Diabetes screening. This is done by checking your blood sugar (glucose) after you have not eaten for a while (fasting). You may have this done every 1-3 years.  Abdominal aortic aneurysm (AAA) screening. You may need this if you are a current or former smoker.  Osteoporosis. You may be screened starting at age 60 if you are at high risk. Talk with your health care provider about your test results, treatment options, and if necessary, the need for more tests. Vaccines  Your health care provider may recommend  certain vaccines, such as:  Influenza vaccine. This is recommended every year.  Tetanus, diphtheria, and acellular pertussis (Tdap, Td) vaccine. You may need a Td booster every 10 years.  Zoster vaccine. You may need this after age 9.  Pneumococcal 13-valent  conjugate (PCV13) vaccine. One dose is recommended after age 81.  Pneumococcal polysaccharide (PPSV23) vaccine. One dose is recommended after age 45. Talk to your health care provider about which screenings and vaccines you need and how often you need them. This information is not intended to replace advice given to you by your health care provider. Make sure you discuss any questions you have with your health care provider. Document Released: 07/10/2015 Document Revised: 03/02/2016 Document Reviewed: 04/14/2015 Elsevier Interactive Patient Education  2017 [place] Prevention in the Home Falls can cause injuries. They can happen to people of all ages. There are many things you can do to make your home safe and to help prevent falls. What can I do on the outside of my home?  Regularly fix the edges of walkways and driveways and fix any cracks.  Remove anything that might make you trip as you walk through a door, such as a raised step or threshold.  Trim any bushes or trees on the path to your home.  Use bright outdoor lighting.  Clear any walking paths of anything that might make someone trip, such as rocks or tools.  Regularly check to see if handrails are loose or broken. Make sure that both sides of any steps have handrails.  Any raised decks and porches should have guardrails on the edges.  Have any leaves, snow, or ice cleared regularly.  Use sand or salt on walking paths during winter.  Clean up any spills in your garage right away. This includes oil or grease spills. What can I do in the bathroom?  Use night lights.  Install grab bars by the toilet and in the tub and shower. Do not use towel bars as grab bars.  Use non-skid mats or decals in the tub or shower.  If you need to sit down in the shower, use a plastic, non-slip stool.  Keep the floor dry. Clean up any water that spills on the floor as soon as it happens.  Remove soap buildup in the tub or  shower regularly.  Attach bath mats securely with double-sided non-slip rug tape.  Do not have throw rugs and other things on the floor that can make you trip. What can I do in the bedroom?  Use night lights.  Make sure that you have a light by your bed that is easy to reach.  Do not use any sheets or blankets that are too big for your bed. They should not hang down onto the floor.  Have a firm chair that has side arms. You can use this for support while you get dressed.  Do not have throw rugs and other things on the floor that can make you trip. What can I do in the kitchen?  Clean up any spills right away.  Avoid walking on wet floors.  Keep items that you use a lot in easy-to-reach places.  If you need to reach something above you, use a strong step stool that has a grab bar.  Keep electrical cords out of the way.  Do not use floor polish or wax that makes floors slippery. If you must use wax, use non-skid floor wax.  Do not have throw rugs and other  things on the floor that can make you trip. What can I do with my stairs?  Do not leave any items on the stairs.  Make sure that there are handrails on both sides of the stairs and use them. Fix handrails that are broken or loose. Make sure that handrails are as long as the stairways.  Check any carpeting to make sure that it is firmly attached to the stairs. Fix any carpet that is loose or worn.  Avoid having throw rugs at the top or bottom of the stairs. If you do have throw rugs, attach them to the floor with carpet tape.  Make sure that you have a light switch at the top of the stairs and the bottom of the stairs. If you do not have them, ask someone to add them for you. What else can I do to help prevent falls?  Wear shoes that:  Do not have high heels.  Have rubber bottoms.  Are comfortable and fit you well.  Are closed at the toe. Do not wear sandals.  If you use a stepladder:  Make sure that it is fully  opened. Do not climb a closed stepladder.  Make sure that both sides of the stepladder are locked into place.  Ask someone to hold it for you, if possible.  Clearly mark and make sure that you can see:  Any grab bars or handrails.  First and last steps.  Where the edge of each step is.  Use tools that help you move around (mobility aids) if they are needed. These include:  Canes.  Walkers.  Scooters.  Crutches.  Turn on the lights when you go into a dark area. Replace any light bulbs as soon as they burn out.  Set up your furniture so you have a clear path. Avoid moving your furniture around.  If any of your floors are uneven, fix them.  If there are any pets around you, be aware of where they are.  Review your medicines with your doctor. Some medicines can make you feel dizzy. This can increase your chance of falling. Ask your doctor what other things that you can do to help prevent falls. This information is not intended to replace advice given to you by your health care provider. Make sure you discuss any questions you have with your health care provider. Document Released: 04/09/2009 Document Revised: 11/19/2015 Document Reviewed: 07/18/2014 Elsevier Interactive Patient Education  2017 [employer].

## 2020-07-26 DIAGNOSIS — Z03818 Encounter for observation for suspected exposure to other biological agents ruled out: Secondary | ICD-10-CM | POA: Diagnosis not present

## 2020-07-26 DIAGNOSIS — Z20822 Contact with and (suspected) exposure to covid-19: Secondary | ICD-10-CM | POA: Diagnosis not present

## 2020-10-01 DIAGNOSIS — L570 Actinic keratosis: Secondary | ICD-10-CM | POA: Diagnosis not present

## 2020-10-01 DIAGNOSIS — D1801 Hemangioma of skin and subcutaneous tissue: Secondary | ICD-10-CM | POA: Diagnosis not present

## 2020-10-01 DIAGNOSIS — L820 Inflamed seborrheic keratosis: Secondary | ICD-10-CM | POA: Diagnosis not present

## 2020-10-01 DIAGNOSIS — Z85828 Personal history of other malignant neoplasm of skin: Secondary | ICD-10-CM | POA: Diagnosis not present

## 2020-10-01 DIAGNOSIS — D225 Melanocytic nevi of trunk: Secondary | ICD-10-CM | POA: Diagnosis not present

## 2020-10-01 DIAGNOSIS — L821 Other seborrheic keratosis: Secondary | ICD-10-CM | POA: Diagnosis not present

## 2020-10-01 DIAGNOSIS — D485 Neoplasm of uncertain behavior of skin: Secondary | ICD-10-CM | POA: Diagnosis not present

## 2020-10-10 DIAGNOSIS — Z20822 Contact with and (suspected) exposure to covid-19: Secondary | ICD-10-CM | POA: Diagnosis not present

## 2020-11-12 DIAGNOSIS — Z85828 Personal history of other malignant neoplasm of skin: Secondary | ICD-10-CM | POA: Diagnosis not present

## 2020-11-12 DIAGNOSIS — D485 Neoplasm of uncertain behavior of skin: Secondary | ICD-10-CM | POA: Diagnosis not present

## 2020-11-12 DIAGNOSIS — D2239 Melanocytic nevi of other parts of face: Secondary | ICD-10-CM | POA: Diagnosis not present

## 2020-11-12 DIAGNOSIS — L820 Inflamed seborrheic keratosis: Secondary | ICD-10-CM | POA: Diagnosis not present

## 2020-12-06 ENCOUNTER — Encounter: Payer: Self-pay | Admitting: Family Medicine

## 2021-01-20 DIAGNOSIS — Z20822 Contact with and (suspected) exposure to covid-19: Secondary | ICD-10-CM | POA: Diagnosis not present

## 2021-02-09 ENCOUNTER — Ambulatory Visit (INDEPENDENT_AMBULATORY_CARE_PROVIDER_SITE_OTHER): Payer: PPO | Admitting: Family Medicine

## 2021-02-09 ENCOUNTER — Encounter: Payer: Self-pay | Admitting: Family Medicine

## 2021-02-09 ENCOUNTER — Other Ambulatory Visit: Payer: Self-pay

## 2021-02-09 VITALS — BP 130/86 | HR 83 | Temp 98.3°F | Ht 74.0 in | Wt 155.8 lb

## 2021-02-09 DIAGNOSIS — E785 Hyperlipidemia, unspecified: Secondary | ICD-10-CM | POA: Diagnosis not present

## 2021-02-09 DIAGNOSIS — R7989 Other specified abnormal findings of blood chemistry: Secondary | ICD-10-CM

## 2021-02-09 DIAGNOSIS — R202 Paresthesia of skin: Secondary | ICD-10-CM | POA: Diagnosis not present

## 2021-02-09 DIAGNOSIS — Z0000 Encounter for general adult medical examination without abnormal findings: Secondary | ICD-10-CM | POA: Diagnosis not present

## 2021-02-09 DIAGNOSIS — C61 Malignant neoplasm of prostate: Secondary | ICD-10-CM

## 2021-02-09 LAB — COMPREHENSIVE METABOLIC PANEL
ALT: 16 U/L (ref 0–53)
AST: 22 U/L (ref 0–37)
Albumin: 4.1 g/dL (ref 3.5–5.2)
Alkaline Phosphatase: 53 U/L (ref 39–117)
BUN: 13 mg/dL (ref 6–23)
CO2: 29 mEq/L (ref 19–32)
Calcium: 9 mg/dL (ref 8.4–10.5)
Chloride: 97 mEq/L (ref 96–112)
Creatinine, Ser: 1.02 mg/dL (ref 0.40–1.50)
GFR: 68.49 mL/min (ref >60.00)
Glucose, Bld: 101 mg/dL — ABNORMAL HIGH (ref 70–99)
Potassium: 4.1 mEq/L (ref 3.5–5.1)
Sodium: 133 mEq/L — ABNORMAL LOW (ref 135–145)
Total Bilirubin: 1.1 mg/dL (ref 0.2–1.2)
Total Protein: 7.1 g/dL (ref 6.0–8.3)

## 2021-02-09 LAB — CBC WITH DIFFERENTIAL/PLATELET
Basophils Absolute: 0 10*3/uL (ref 0.0–0.1)
Basophils Relative: 0.6 % (ref 0.0–3.0)
Eosinophils Absolute: 0.1 10*3/uL (ref 0.0–0.7)
Eosinophils Relative: 1.3 % (ref 0.0–5.0)
HCT: 42.1 % (ref 39.0–52.0)
Hemoglobin: 14.3 g/dL (ref 13.0–17.0)
Lymphocytes Relative: 23.6 % (ref 12.0–46.0)
Lymphs Abs: 1.7 10*3/uL (ref 0.7–4.0)
MCHC: 33.9 g/dL (ref 30.0–36.0)
MCV: 90.3 fl (ref 78.0–100.0)
Monocytes Absolute: 0.6 10*3/uL (ref 0.1–1.0)
Monocytes Relative: 9 % (ref 3.0–12.0)
Neutro Abs: 4.7 10*3/uL (ref 1.4–7.7)
Neutrophils Relative %: 65.5 % (ref 43.0–77.0)
Platelets: 237 10*3/uL (ref 150.0–400.0)
RBC: 4.66 Mil/uL (ref 4.22–5.81)
RDW: 13.7 % (ref 11.5–15.5)
WBC: 7.2 10*3/uL (ref 4.0–10.5)

## 2021-02-09 LAB — LIPID PANEL
Cholesterol: 169 mg/dL (ref 0–200)
HDL: 70.8 mg/dL (ref >39.00)
LDL Cholesterol: 75 mg/dL (ref 0–99)
NonHDL: 98.27
Total CHOL/HDL Ratio: 2
Triglycerides: 115 mg/dL (ref 0.0–149.0)
VLDL: 23 mg/dL (ref 0.0–40.0)

## 2021-02-09 LAB — VITAMIN B12: Vitamin B-12: 534 pg/mL (ref 211–911)

## 2021-02-09 LAB — PSA: PSA: 18.74 ng/mL — ABNORMAL HIGH (ref 0.10–4.00)

## 2021-02-09 LAB — TSH: TSH: 4.97 u[IU]/mL (ref 0.35–5.50)

## 2021-02-09 NOTE — Progress Notes (Signed)
Phone: [phone]   Subjective:  Patient presents today for their annual physical. Chief complaint-noted.   See problem oriented charting- ROS- full  review of systems was completed and negative  per full ROS sheet  The following were reviewed and entered/updated in epic: Past Medical History:  Diagnosis Date   Adenoma 2008   serrated adenoma of colon   Diverticulosis    GERD (gastroesophageal reflux disease)    H. Pylori related-gone when treated   Hyperlipidemia    Prostate cancer [facility]) 1994   radical prostatectomy Dr. Gentry Fitz   Right inguinal hernia    Seasonal allergies    Sinusitis acute    Patient Active Problem List   Diagnosis Date Noted   Elevated TSH 06/02/2014    Priority: Medium   Prostate cancer [facility])     Priority: Medium   Hyperlipidemia 03/10/2009    Priority: Medium   History of colon polyps     Priority: Low   Basal cell cancer 04/08/2013    Priority: Low   ALLERGIC RHINITIS 08/06/2007    Priority: Low   Varicose vein of leg 12/08/2017   Hearing loss 12/08/2017   Hemorrhagic prepatellar bursitis of left knee 09/16/2015   Past Surgical History:  Procedure Laterality Date   APPENDECTOMY     CATARACT EXTRACTION, BILATERAL Bilateral 2018   CHOLECYSTECTOMY  05/2009   INGUINAL HERNIA REPAIR Right 04/13/2020   Procedure: OPEN RIGHT INGUINAL HERNIA REPAIR;  Surgeon: Dwan Bolt, MD;  Location: [place];  Service: General;  Laterality: Right;   prostatectomy  1994   [facility]   TONSILLECTOMY      Family History  Problem Relation Age of Onset   Prostate cancer Paternal Uncle    Prostate cancer Cousin    Pneumonia Father        aspiration   Other Other 16       smoker. sudden death unclear cause   Colon cancer Neg Hx     Medications- reviewed and updated Current Outpatient Medications  Medication Sig Dispense Refill   cetirizine (ZYRTEC) 10 MG tablet Take 10 mg by mouth daily.     guaiFENesin (MUCINEX) 600 MG 12  hr tablet Take by mouth 2 (two) times daily as needed.     MELATONIN PO Take by mouth as needed.     Multiple Vitamin (MULTIVITAMIN WITH MINERALS) TABS tablet Take 1 tablet by mouth daily.     naproxen sodium (ALEVE) 220 MG tablet Take 220 mg by mouth as needed.     Omega-3 Fatty Acids (FISH OIL) 1000 MG CAPS Take by mouth.     docusate sodium (COLACE) 100 MG capsule Take 1 capsule (100 mg total) by mouth daily as needed. (Patient not taking: Reported on 02/09/2021) 60 capsule 0   No current facility-administered medications for this visit.    Allergies-reviewed and updated No Known Allergies  Social History   Social History Narrative   From [place]    Came down to [employer] for graduate studies- Phd in botany.    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       Married 1985.1st marriage. No kids. No pets.       Hobbies- above + gardening      Objective  Objective:  BP 130/86   Pulse 83   Temp 98.3 F (36.8 C) (Temporal)  Ht '6\' 2"'$  (1.88 m)   Wt 155 lb 12.8 oz (70.7 kg)   SpO2 98%   BMI 20.00 kg/m  Gen: NAD, resting comfortably HEENT: Mucous membranes are moist. Oropharynx normal Neck: no thyromegaly CV: RRR no murmurs rubs or gallops Lungs: CTAB no crackles, wheeze, rhonchi Abdomen: soft/nontender/nondistended/normal bowel sounds. No rebound or guarding.  Ext: no edema Skin: warm, dry Neuro: grossly normal, moves all extremities, PERRLA    Assessment and Plan  80 y.o. male presenting for annual physical.  Health Maintenance counseling: 1. Anticipatory guidance: Patient counseled regarding regular dental exams -q6 months, eye exams -yearly- has done well after cataract surgery in the past,  avoiding smoking and second hand smoke, limiting alcohol to 2 beverages per day - usually does 1 per day - rarely up to 2 .   2. Risk factor reduction:   Advised patient of need for regular exercise and diet rich and fruits and vegetables to reduce risk of heart attack and stroke. Exercise-cardio, dance, elliptical sneakers - most days of the week plus gardening. Diet-cooks at home - weight is stable to improved this year Wt Readings from Last 3 Encounters:  02/09/21 155 lb 12.8 oz (70.7 kg)  04/13/20 151 lb 3.8 oz (68.6 kg)  02/11/20 154 lb 12.8 oz (70.2 kg)  3. Immunizations/screenings/ancillary studies - discussed #4 COVID-19 booster - had covid in June- we agreed on waiting for new variant specific vaccination-  and Flu vaccination - he will let us know when gets through [facility] - will send [place] the date - otherwise up-to-date. Immunization History  Administered Date(s) Administered   Hepatitis A, Adult 12/16/1993, 07/28/1994   Influenza Whole 03/28/1999   Influenza,inj,Quad PF,6+ Mos 04/03/2013, 03/19/2014   Influenza-Unspecified 03/21/2011, 04/24/2015, 03/21/2016, 03/21/2017, 03/25/2018, 03/19/2020   PFIZER(Purple Top)SARS-COV-2 Vaccination 07/17/2019, 08/07/2019, 03/25/2020   Pneumococcal Conjugate-13 06/02/2014   Pneumococcal Polysaccharide-23 12/07/2006   Td 12/07/2006   Tdap 11/04/2015   Typhoid Inactivated 12/16/1993   Yellow Fever 12/16/1993   Zoster Recombinat (Shingrix) 12/13/2017, 02/21/2018   Zoster, Live 02/23/2010  4. Prostate cancer screening- history of radical prostatectomy. He follows with Dr. Junious Silk of urology- recommended a follow up with urology when PSA levels were elevated. He had declined androgen deprivation and salvage radiation in the past - did not want to pursue treatment unless symptomatic - I still preferred for him to have a follow-up.  Nocturia 1-2x a night and having to strain more after urinates to get it all out. Discussed referral again today- he wants to go back only if PSA worsening Lab Results  Component Value Date   PSA 14.8 (H) 02/04/2020   PSA 16.01 (H) 12/11/2018   PSA 12.69 (H) 12/08/2017   5.  Colon cancer screening - last completed in 2016 and was told no further colonoscopy due to age  5. Skin cancer screening- follows with Dr. Ronnald Ramp yearly. advised regular sunscreen use. Denies worrisome, changing, or new skin lesions.  7. never smoker 8. STD screening - declines as monogamous  Status of chronic or acute concerns   #inguinal hernia- repair- did well with surgery  # right knee meniscal tear- has had one injection and did well- trying to be careful with dancing- knows he can go back if needed for another injection   #constipation- update TSH. Metamucil is costly. Docusate somewhat helpful- but was scared to take for longer period- I am ok with longer use - no blood in stool or dark black stool  #mild numbness/tingling in feet- possible  neuropathy. Present for a year or two. Check tsh, b12. Offered neuro referral- declines for now  #right glute strain- reports improving at present- should return to see [place] or refer to sports medicine.   #skipped beats- if checks his pulse sometimes feels a missed beat. Offered cardiac monitor- declines for now. Not occuring now. Doesn't feel palpitations  #High blood pressure in the past once or twice active PE-underwent IV S: medication: none - last visit patients blood pressure was higher than normal  - recommended home monitoring last visit- goal was to be < 140/90 - but not checking - advised patient to work on healthy eating/low salt/regular exercise  Home readings #s: does not check at home BP Readings from Last 3 Encounters:  02/09/21 130/86  04/13/20 (!) 141/91  02/11/20 130/80  A/P: reasonable control on recheck  #hyperlipidemia S: Medication: ega 3 fatty acids/fish oil. #s have improved with age Lab Results  Component Value Date   CHOL 150 02/04/2020   HDL 67 02/04/2020   LDLCALC 62 02/04/2020   TRIG 131 02/04/2020   CHOLHDL 2.2 02/04/2020   A/P: doing well last check- update again today  #Elevated TSH - mild elevation in  the past- will check again today- consider treatment if above 10 Lab Results  Component Value Date   TSH 5.09 (H) 02/04/2020   #Bilateral hearing loss - referred to audiologist in 2020 and rx for hearing aids at that time  #mild moderate arthritis in the hands- patient did use naproxen in the past and recommended Voltaren gel instead for up to 4x a day for flare ups - lately was not severe enough that he wants to take medicine. Could try turmeric antiinflammatory  Recommended follow up: 1 year physical Future Appointments  Date Time Provider [place]  06/10/2021  1:00 PM [facility] CO[facility]   Lab/Order associations:NOT fasting   ICD-10-CM   1. Preventative health care  Z00.00     2. Elevated TSH  R79.89 TSH    3. Hyperlipidemia, unspecified hyperlipidemia type  E78.5 CBC with Differential/Platelet    Comprehensive metabolic panel    Lipid panel    TSH    4. Prostate cancer (HCC)  C61 PSA    5. Paresthesias  R20.2 Vitamin B12     No orders of the defined types were placed in this encounter.  I,Harris Phan,acting as a Education administrator for Garret Reddish, MD.,have documented all relevant documentation on the behalf of Garret Reddish, MD,as directed by  Garret Reddish, MD while in the presence of Garret Reddish, MD.  I, Garret Reddish, MD, have reviewed all documentation for this visit. The documentation on 02/09/21 for the exam, diagnosis, procedures, and orders are all accurate and complete.   Return precautions advised.  Garret Reddish, MD

## 2021-02-09 NOTE — Patient Instructions (Addendum)
Health Maintenance Due  Topic Date Due   COVID-19 Vaccine (4 - Booster for [employer] series) Patient will call and update [place] on date. Consider getting the new variant vaccination when it is available. 06/24/2020   INFLUENZA VACCINE Patient will get this done through [facility] once we begin to offer them.  Please consider getting your flu shot in the Fall. If you get this outside of our office, please let us know. 01/25/2021   Please stop by lab before you go If you have mychart- we will send your results within 3 business days of [place] receiving them.  If you do not have mychart- we will call you about results within 5 business days of [place] receiving them.  *please also note that you will see labs on mychart as soon as they post. I will later go in and write notes on them- will say "notes from Dr. Yong Channel"   I would like to refer you to urology-you prefer to get your PSA back first but I would prefer for you to see them regardless-please let me know if you change your mind  If your feet continues to bother you, please let me know and we can place a referral to neurology.checking basic labs today  You cold try tumeric 500 mg daily for the arthritis in your hands. Rinaldo Ratel may get this at your local pharmacy or over-the-counter. -still recommend voltaren gel  Recommended follow up: Return in about 1 year (around 02/09/2022) for physical as long as your blood pressure comes down.

## 2021-02-10 ENCOUNTER — Other Ambulatory Visit: Payer: Self-pay

## 2021-02-10 DIAGNOSIS — C61 Malignant neoplasm of prostate: Secondary | ICD-10-CM

## 2021-03-01 ENCOUNTER — Encounter: Payer: Self-pay | Admitting: Family Medicine

## 2021-03-20 ENCOUNTER — Encounter: Payer: Self-pay | Admitting: Family Medicine

## 2021-03-29 NOTE — Progress Notes (Signed)
Patient called regarding referrals from PCP for oncology/urology.   RN explained to patient that he would need urology referral first, and if needed would be referred to oncology.  Verbalized understanding.   RN collaborated with [facility] to obtain apt for 11/30 with Dr. Junious Silk.   Dr. Junious Silk has agreed to review schedule to see if he would be able to see patient earlier than 11/30. Patient notified of above.

## 2021-03-31 DIAGNOSIS — R9721 Rising PSA following treatment for malignant neoplasm of prostate: Secondary | ICD-10-CM | POA: Diagnosis not present

## 2021-04-20 ENCOUNTER — Other Ambulatory Visit (HOSPITAL_COMMUNITY): Payer: Self-pay | Admitting: Urology

## 2021-04-20 DIAGNOSIS — R9721 Rising PSA following treatment for malignant neoplasm of prostate: Secondary | ICD-10-CM

## 2021-04-29 ENCOUNTER — Other Ambulatory Visit: Payer: Self-pay

## 2021-04-29 ENCOUNTER — Encounter (HOSPITAL_COMMUNITY)
Admission: RE | Admit: 2021-04-29 | Discharge: 2021-04-29 | Disposition: A | Discharge status: Home / Self Care | Payer: PPO | Source: Ambulatory Visit | Attending: Urology | Admitting: Urology

## 2021-04-29 DIAGNOSIS — R918 Other nonspecific abnormal finding of lung field: Secondary | ICD-10-CM | POA: Diagnosis not present

## 2021-04-29 DIAGNOSIS — C61 Malignant neoplasm of prostate: Secondary | ICD-10-CM | POA: Diagnosis not present

## 2021-04-29 DIAGNOSIS — R9721 Rising PSA following treatment for malignant neoplasm of prostate: Secondary | ICD-10-CM | POA: Insufficient documentation

## 2021-04-29 MED ORDER — PIFLIFOLASTAT F 18 (PYLARIFY) INJECTION
9.0000 | Freq: Once | INTRAVENOUS | Status: AC
  Administered 2021-04-29: 9.4 via INTRAVENOUS

## 2021-05-06 DIAGNOSIS — R9721 Rising PSA following treatment for malignant neoplasm of prostate: Secondary | ICD-10-CM | POA: Diagnosis not present

## 2021-06-10 ENCOUNTER — Ambulatory Visit (INDEPENDENT_AMBULATORY_CARE_PROVIDER_SITE_OTHER): Payer: PPO

## 2021-06-10 ENCOUNTER — Other Ambulatory Visit: Payer: Self-pay

## 2021-06-10 DIAGNOSIS — Z0000 Encounter for general adult medical examination without abnormal findings: Secondary | ICD-10-CM | POA: Diagnosis not present

## 2021-06-10 NOTE — Progress Notes (Signed)
Virtual Visit via Telephone Note  I connected with  Juan Lin on 06/10/21 at  1:00 PM EST by telephone and verified that I am speaking with the correct person using two identifiers.  Medicare Annual Wellness visit completed telephonically due to Covid-19 pandemic.   Persons participating in this call: This Health Coach and this patient.   Location: Patient: Home Provider: Office   I discussed the limitations, risks, security and privacy concerns of performing an evaluation and management service by telephone and the availability of in person appointments. The patient expressed understanding and agreed to proceed.  Unable to perform video visit due to video visit attempted and failed and/or patient does not have video capability.   Some vital signs may be absent or patient reported.   Juan Brace, LPN   Subjective:   Juan Lin is a 80 y.o. male who presents for Medicare Annual/Subsequent preventive examination.  Review of Systems     Cardiac Risk Factors include: advanced age (>75men, >34 women);dyslipidemia;male gender     Objective:    There were no vitals filed for this visit. There is no height or weight on file to calculate BMI.  Advanced Directives 06/10/2021 06/04/2020 04/13/2020 04/07/2020 04/23/2019 10/04/2017 06/17/2016  Does Patient Have a Medical Advance Directive? Yes Yes Yes Yes Yes Yes Yes  Type of Paramedic of [place];Living will Out of facility DNR (pink MOST or yellow form) [place];Living will Living will;Healthcare Power of Attorney Living will;Healthcare Power of [place];Living will  Does patient want to make changes to medical advance directive? - - No - Patient declined No - Patient declined No - Patient declined No - Patient declined -  Copy of [place] in Chart? No - copy requested No - copy requested - No - copy  requested No - copy requested No - copy requested -    Current Medications (verified) Outpatient Encounter Medications as of 06/10/2021  Medication Sig   cetirizine (ZYRTEC) 10 MG tablet Take 10 mg by mouth daily.   docusate sodium (COLACE) 100 MG capsule Take 100 mg by mouth daily.   guaiFENesin (MUCINEX) 600 MG 12 hr tablet Take by mouth 2 (two) times daily as needed.   MELATONIN PO Take by mouth as needed.   Multiple Vitamin (MULTIVITAMIN WITH MINERALS) TABS tablet Take 1 tablet by mouth daily.   naproxen sodium (ALEVE) 220 MG tablet Take 220 mg by mouth as needed.   Omega-3 Fatty Acids (FISH OIL) 1000 MG CAPS Take by mouth.   PFIZER COVID-19 VAC BIVALENT injection    No facility-administered encounter medications on file as of 06/10/2021.    Allergies (verified) Patient has no known allergies.   History: Past Medical History:  Diagnosis Date   Adenoma 2008   serrated adenoma of colon   Diverticulosis    GERD (gastroesophageal reflux disease)    H. Pylori related-gone when treated   Hyperlipidemia    Prostate cancer [facility]) 1994   radical prostatectomy Dr. Gentry Fitz   Right inguinal hernia    Seasonal allergies    Sinusitis acute    Past Surgical History:  Procedure Laterality Date   APPENDECTOMY     CATARACT EXTRACTION, BILATERAL Bilateral 2018   CHOLECYSTECTOMY  05/2009   INGUINAL HERNIA REPAIR Right 04/13/2020   Procedure: OPEN RIGHT INGUINAL HERNIA REPAIR;  Surgeon: Dwan Bolt, MD;  Location: [place];  Service: General;  Laterality: Right;   prostatectomy  1994   [facility]   TONSILLECTOMY     Family History  Problem Relation Age of Onset   Prostate cancer Paternal Uncle    Prostate cancer Cousin    Pneumonia Father        aspiration   Other Other 21       smoker. sudden death unclear cause   Colon cancer Neg Hx    Social History   Socioeconomic History   Marital status: Married    Spouse name: Not on file   Number of  children: Not on file   Years of education: Not on file   Highest education level: Not on file  Occupational History    Comment: currently volunteers as musician  Tobacco Use   Smoking status: Never   Smokeless tobacco: Never  Vaping Use   Vaping Use: Never used  Substance and Sexual Activity   Alcohol use: Yes    Alcohol/week: 7.0 standard drinks    Types: 7 Standard drinks or equivalent per week    Comment: glass wine with dinner   Drug use: Yes    Types: Amyl nitrate, IV   Sexual activity: Not on file  Other Topics Concern   Not on file  Social History Narrative   [place]    Came down to [employer] for graduate studies- Phd in botany.    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       Married 1985.1st marriage. No kids. No pets.       Hobbies- above + gardening      Social Determinants of Health   Financial Resource Strain: Low Risk    Difficulty of Paying Living Expenses: Not hard at all  Food Insecurity: No Food Insecurity   Worried About Charity fundraiser in the Last Year: Never true   Arboriculturist in the Last Year: Never true  Transportation Needs: No Transportation Needs   Lack of Transportation (Medical): No   Lack of Transportation (Non-Medical): No  Physical Activity: Sufficiently Active   Days of Exercise per Week: 4 days   Minutes of Exercise per Session: 60 min  Stress: No Stress Concern Present   Feeling of Stress : Not at all  Social Connections: Moderately Integrated   Frequency of Communication with Friends and Family: Three times a week   Frequency of Social Gatherings with Friends and Family: Three times a week   Attends Religious Services: Never   Active Member of Clubs or Organizations: Yes   Attends Archivist Meetings: 1 to 4 times per year   Marital Status: Married    Tobacco  Counseling Counseling given: Not Answered   Clinical Intake:  Pre-visit preparation completed: Yes  Pain : No/denies pain     BMI - recorded: 20.1 Nutritional Status: BMI of 19-24  Normal Nutritional Risks: None Diabetes: No  How often do you need to have someone help you when you read instructions, pamphlets, or other written materials from your doctor or pharmacy?: 1 - Never  Diabetic?No  Interpreter Needed?: No  Information entered by :: Charlott Rakes, LPN   Activities of Daily Living In your present state of health, do you have any difficulty performing the following activities: 06/10/2021  Hearing? Y  Vision? N  Difficulty concentrating or making decisions? N  Walking  or climbing stairs? N  Dressing or bathing? N  Doing errands, shopping? N  Preparing Food and eating ? N  Using the Toilet? N  In the past six months, have you accidently leaked urine? N  Do you have problems with loss of bowel control? N  Managing your Medications? N  Managing your Finances? N  Housekeeping or managing your Housekeeping? N  Some recent data might be hidden    Patient Care Team: Marin Olp, MD as PCP - General (Family Medicine) Alanda Slim, Neena Rhymes, MD as Consulting Physician (Ophthalmology) Festus Aloe, MD as Consulting Physician (Urology) Sherilyn Banker, Bevely Palmer, MD as Consulting Physician (Audiology)  Indicate any recent Medical Services you may have received from other than [facility] providers in the past year (date may be approximate).     Assessment:   This is a routine wellness examination for Festus.  Hearing/Vision screen Hearing Screening - Comments:: Pt wears hearing aids  Vision Screening - Comments:: Pt follows up with Dr Sabra Heck for annual eye exams   Dietary issues and exercise activities discussed: Current Exercise Habits: Home exercise routine;Structured exercise class, Type of exercise: Other - see comments (eliptical), Time (Minutes): 60, Frequency  (Times/Week): 4, Weekly Exercise (Minutes/Week): 240   Goals Addressed             This Visit's Progress    Patient Stated       Stay healthy        Depression Screen PHQ 2/9 Scores 06/10/2021 06/04/2020 02/04/2020 04/23/2019 12/11/2018 10/04/2017 06/17/2016  PHQ - 2 Score 0 0 0 0 0 0 0  PHQ- 9 Score - - 0 - - 0 -    Fall Risk Fall Risk  06/10/2021 06/04/2020 04/23/2019 12/11/2018 10/04/2017  Falls in the past year? 0 0 0 0 No  Number falls in past yr: 0 0 - - -  Injury with Fall? 0 0 0 - -  Risk for fall due to : - Impaired vision - - -  Follow up Falls prevention discussed Falls prevention discussed Education provided;Falls prevention discussed;Falls evaluation completed - -    FALL RISK PREVENTION PERTAINING TO THE HOME:  Any stairs in or around the home? Yes  If so, are there any without handrails? No  Home free of loose throw rugs in walkways, pet beds, electrical cords, etc? Yes  Adequate lighting in your home to reduce risk of falls? Yes   ASSISTIVE DEVICES UTILIZED TO PREVENT FALLS:  Life alert? No  Use of a cane, walker or w/c? No  Grab bars in the bathroom? No  Shower chair or bench in shower? No  Elevated toilet seat or a handicapped toilet? No   TIMED UP AND GO:  Was the test performed? No .   Cognitive Function: MMSE - Mini Mental State Exam 06/17/2016  Not completed: (No Data)     6CIT Screen 06/10/2021 06/04/2020 04/23/2019  What Year? 0 points 0 points 0 points  What month? 0 points 0 points 0 points  What time? 0 points - 0 points  Count back from 20 0 points 0 points 0 points  Months in reverse 0 points 0 points 0 points  Repeat phrase 0 points 0 points 0 points  Total Score 0 - 0    Immunizations Immunization History  Administered Date(s) Administered   Hepatitis A, Adult 12/16/1993, 07/28/1994   Influenza Whole 03/28/1999   Influenza,inj,Quad PF,6+ Mos 04/03/2013, 03/19/2014, 03/19/2021   Influenza-Unspecified 03/21/2011, 04/24/2015,  03/21/2016, 03/21/2017, 03/25/2018, 03/19/2020  PFIZER(Purple Top)SARS-COV-2 Vaccination 07/17/2019, 08/07/2019, 03/25/2020, 03/29/2021   Pneumococcal Conjugate-13 06/02/2014   Pneumococcal Polysaccharide-23 12/07/2006   Td 12/07/2006   Tdap 11/04/2015   Typhoid Inactivated 12/16/1993   Yellow Fever 12/16/1993   Zoster Recombinat (Shingrix) 12/13/2017, 02/21/2018   Zoster, Live 02/23/2010    TDAP status: Up to date  Flu Vaccine status: Up to date  Pneumococcal vaccine status: Up to date  Covid-19 vaccine status: Completed vaccines  Qualifies for Shingles Vaccine? Yes   Zostavax completed Yes   Shingrix Completed?: Yes  Screening Tests Health Maintenance  Topic Date Due   COVID-19 Vaccine (5 - Booster for [employer] series) 05/24/2021   TETANUS/TDAP  11/03/2025   Pneumonia Vaccine 53+ Years old  Completed   INFLUENZA VACCINE  Completed   Zoster Vaccines- Shingrix  Completed   HPV VACCINES  Aged Out    Health Maintenance  Health Maintenance Due  Topic Date Due   COVID-19 Vaccine (5 - Booster for [place] series) 05/24/2021    Colorectal cancer screening: No longer required.   Additional Screening:   Vision Screening: Recommended annual ophthalmology exams for early detection of glaucoma and other disorders of the eye. Is the patient up to date with their annual eye exam?  Yes  Who is the provider or what is the name of the office in which the patient attends annual eye exams? Dr Sabra Heck  If pt is not established with a provider, would they like to be referred to a provider to establish care? No .   Dental Screening: Recommended annual dental exams for proper oral hygiene  Community Resource Referral / Chronic Care Management: CRR required this visit?  No   CCM required this visit?  No      Plan:     I have personally reviewed and noted the following in the patients chart:   Medical and social history Use of alcohol, tobacco or illicit drugs  Current  medications and supplements including opioid prescriptions. Patient is not currently taking opioid prescriptions. Functional ability and status Nutritional status Physical activity Advanced directives List of other physicians Hospitalizations, surgeries, and ER visits in previous 12 months Vitals Screenings to include cognitive, depression, and falls Referrals and appointments  In addition, I have reviewed and discussed with patient certain preventive protocols, quality metrics, and best practice recommendations. A written personalized care plan for preventive services as well as general preventive health recommendations were provided to patient.     Juan Brace, LPN   12/21/9483   Nurse Notes: None

## 2021-06-10 NOTE — Patient Instructions (Signed)
Mr. Juan Lin , Thank you for taking time to come for your Medicare Wellness Visit. I appreciate your ongoing commitment to your health goals. Please review the following plan we discussed and let me know if I can assist you in the future.   Screening recommendations/referrals: Colonoscopy: no longer required  Recommended yearly ophthalmology/optometry visit for glaucoma screening and checkup Recommended yearly dental visit for hygiene and checkup  Vaccinations: Influenza vaccine: Done 03/19/21  Pneumococcal vaccine: Up to date Tdap vaccine: Done 11/04/15 repeat every 10 years  Shingles vaccine: Completed 6/19 & 02/21/18   Covid-19: Completed 1/20, 2/10, 03/25/20 & 03/29/21  Advanced directives: Please bring a copy of your health care power of attorney and living will to the office at your convenience.   Conditions/risks identified: Stay healthy   Next appointment: Follow up in one year for your annual wellness visit.   Preventive Care 30 Years and Older, Male Preventive care refers to lifestyle choices and visits with your health care provider that can promote health and wellness. What does preventive care include? A yearly physical exam. This is also called an annual well check. Dental exams once or twice a year. Routine eye exams. Ask your health care provider how often you should have your eyes checked. Personal lifestyle choices, including: Daily care of your teeth and gums. Regular physical activity. Eating a healthy diet. Avoiding tobacco and drug use. Limiting alcohol use. Practicing safe sex. Taking low doses of aspirin every day. Taking vitamin and mineral supplements as recommended by your health care provider. What happens during an annual well check? The services and screenings done by your health care provider during your annual well check will depend on your age, overall health, lifestyle risk factors, and family history of disease. Counseling  Your health care  provider may ask you questions about your: Alcohol use. Tobacco use. Drug use. Emotional well-being. Home and relationship well-being. Sexual activity. Eating habits. History of falls. Memory and ability to understand (cognition). Work and work Statistician. Screening  You may have the following tests or measurements: Height, weight, and BMI. Blood pressure. Lipid and cholesterol levels. These may be checked every 5 years, or more frequently if you are over 24 years old. Skin check. Lung cancer screening. You may have this screening every year starting at age 97 if you have a 30-pack-year history of smoking and currently smoke or have quit within the past 15 years. Fecal occult blood test (FOBT) of the stool. You may have this test every year starting at age 75. Flexible sigmoidoscopy or colonoscopy. You may have a sigmoidoscopy every 5 years or a colonoscopy every 10 years starting at age 1. Prostate cancer screening. Recommendations will vary depending on your family history and other risks. Hepatitis C blood test. Hepatitis B blood test. Sexually transmitted disease (STD) testing. Diabetes screening. This is done by checking your blood sugar (glucose) after you have not eaten for a while (fasting). You may have this done every 1-3 years. Abdominal aortic aneurysm (AAA) screening. You may need this if you are a current or former smoker. Osteoporosis. You may be screened starting at age 77 if you are at high risk. Talk with your health care provider about your test results, treatment options, and if necessary, the need for more tests. Vaccines  Your health care provider may recommend certain vaccines, such as: Influenza vaccine. This is recommended every year. Tetanus, diphtheria, and acellular pertussis (Tdap, Td) vaccine. You may need a Td booster every 10 years. Zoster  vaccine. You may need this after age 72. Pneumococcal 13-valent conjugate (PCV13) vaccine. One dose is  recommended after age 25. Pneumococcal polysaccharide (PPSV23) vaccine. One dose is recommended after age 38. Talk to your health care provider about which screenings and vaccines you need and how often you need them. This information is not intended to replace advice given to you by your health care provider. Make sure you discuss any questions you have with your health care provider. Document Released: 07/10/2015 Document Revised: 03/02/2016 Document Reviewed: 04/14/2015 Elsevier Interactive Patient Education  2017 [place] Prevention in the Home Falls can cause injuries. They can happen to people of all ages. There are many things you can do to make your home safe and to help prevent falls. What can I do on the outside of my home? Regularly fix the edges of walkways and driveways and fix any cracks. Remove anything that might make you trip as you walk through a door, such as a raised step or threshold. Trim any bushes or trees on the path to your home. Use bright outdoor lighting. Clear any walking paths of anything that might make someone trip, such as rocks or tools. Regularly check to see if handrails are loose or broken. Make sure that both sides of any steps have handrails. Any raised decks and porches should have guardrails on the edges. Have any leaves, snow, or ice cleared regularly. Use sand or salt on walking paths during winter. Clean up any spills in your garage right away. This includes oil or grease spills. What can I do in the bathroom? Use night lights. Install grab bars by the toilet and in the tub and shower. Do not use towel bars as grab bars. Use non-skid mats or decals in the tub or shower. If you need to sit down in the shower, use a plastic, non-slip stool. Keep the floor dry. Clean up any water that spills on the floor as soon as it happens. Remove soap buildup in the tub or shower regularly. Attach bath mats securely with double-sided non-slip rug  tape. Do not have throw rugs and other things on the floor that can make you trip. What can I do in the bedroom? Use night lights. Make sure that you have a light by your bed that is easy to reach. Do not use any sheets or blankets that are too big for your bed. They should not hang down onto the floor. Have a firm chair that has side arms. You can use this for support while you get dressed. Do not have throw rugs and other things on the floor that can make you trip. What can I do in the kitchen? Clean up any spills right away. Avoid walking on wet floors. Keep items that you use a lot in easy-to-reach places. If you need to reach something above you, use a strong step stool that has a grab bar. Keep electrical cords out of the way. Do not use floor polish or wax that makes floors slippery. If you must use wax, use non-skid floor wax. Do not have throw rugs and other things on the floor that can make you trip. What can I do with my stairs? Do not leave any items on the stairs. Make sure that there are handrails on both sides of the stairs and use them. Fix handrails that are broken or loose. Make sure that handrails are as long as the stairways. Check any carpeting to make sure that it  is firmly attached to the stairs. Fix any carpet that is loose or worn. Avoid having throw rugs at the top or bottom of the stairs. If you do have throw rugs, attach them to the floor with carpet tape. Make sure that you have a light switch at the top of the stairs and the bottom of the stairs. If you do not have them, ask someone to add them for you. What else can I do to help prevent falls? Wear shoes that: Do not have high heels. Have rubber bottoms. Are comfortable and fit you well. Are closed at the toe. Do not wear sandals. If you use a stepladder: Make sure that it is fully opened. Do not climb a closed stepladder. Make sure that both sides of the stepladder are locked into place. Ask someone to  hold it for you, if possible. Clearly mark and make sure that you can see: Any grab bars or handrails. First and last steps. Where the edge of each step is. Use tools that help you move around (mobility aids) if they are needed. These include: Canes. Walkers. Scooters. Crutches. Turn on the lights when you go into a dark area. Replace any light bulbs as soon as they burn out. Set up your furniture so you have a clear path. Avoid moving your furniture around. If any of your floors are uneven, fix them. If there are any pets around you, be aware of where they are. Review your medicines with your doctor. Some medicines can make you feel dizzy. This can increase your chance of falling. Ask your doctor what other things that you can do to help prevent falls. This information is not intended to replace advice given to you by your health care provider. Make sure you discuss any questions you have with your health care provider. Document Released: 04/09/2009 Document Revised: 11/19/2015 Document Reviewed: 07/18/2014 Elsevier Interactive Patient Education  2017 [employer].

## 2021-06-18 DIAGNOSIS — Z20822 Contact with and (suspected) exposure to covid-19: Secondary | ICD-10-CM | POA: Diagnosis not present

## 2021-07-02 DIAGNOSIS — R9721 Rising PSA following treatment for malignant neoplasm of prostate: Secondary | ICD-10-CM | POA: Diagnosis not present

## 2021-07-07 DIAGNOSIS — R9721 Rising PSA following treatment for malignant neoplasm of prostate: Secondary | ICD-10-CM | POA: Diagnosis not present

## 2021-07-07 DIAGNOSIS — R918 Other nonspecific abnormal finding of lung field: Secondary | ICD-10-CM | POA: Diagnosis not present

## 2021-07-23 ENCOUNTER — Other Ambulatory Visit: Payer: Self-pay

## 2021-07-23 ENCOUNTER — Ambulatory Visit: Payer: PPO | Admitting: Pulmonary Disease

## 2021-07-23 ENCOUNTER — Encounter: Payer: Self-pay | Admitting: Pulmonary Disease

## 2021-07-23 VITALS — BP 122/78 | HR 99 | Temp 97.7°F | Ht 73.0 in | Wt 157.0 lb

## 2021-07-23 DIAGNOSIS — R918 Other nonspecific abnormal finding of lung field: Secondary | ICD-10-CM

## 2021-07-23 DIAGNOSIS — C61 Malignant neoplasm of prostate: Secondary | ICD-10-CM

## 2021-07-23 DIAGNOSIS — A310 Pulmonary mycobacterial infection: Secondary | ICD-10-CM | POA: Diagnosis not present

## 2021-07-23 NOTE — Patient Instructions (Signed)
Thank you for visiting Dr. Valeta Harms at [facility]. Today we recommend the following:  Orders Placed This Encounter  Procedures   CT Super D Chest Wo Contrast   Sputum Culture (AFB, Resp cx, Fungus)   Return in about 3 weeks (around 08/13/2021) for with Eric Form, NP, or Dr. Valeta Harms.    Please do your part to reduce the spread of COVID-19.

## 2021-07-23 NOTE — Addendum Note (Signed)
Addended by: Fran Lowes on: 07/23/2021 03:32 PM   Modules accepted: Orders

## 2021-07-23 NOTE — Progress Notes (Signed)
Synopsis: Referred in January 2023 for abnormal PET scan by Festus Aloe, MD  Subjective:   PATIENT ID: Juan Lin GENDER: male DOB: Oct 30, 1940, MRN: [id number]  Chief Complaint  Patient presents with   Consult    Patient is here to talk about nodules    [name], past medical history of prostate cancer, status post radical prostatectomy, 1999.  Patient is a non-smoker.Patient recently had a PSA specific PET scan completed by urology.  This revealed residual area of carcinoma in the posterior aspect of the prostate gland.  There was progressive nodularity in the right and left lung nodules clustered together with recommendations was consideration of tissue sampling on the PET scan felt to be more inflammatory in nature.  Patient has daily cough and sputum production.  Occasionally has trouble clearing his throat.  Lifelong non-smoker.   Past Medical History:  Diagnosis Date   Adenoma 2008   serrated adenoma of colon   Diverticulosis    GERD (gastroesophageal reflux disease)    H. Pylori related-gone when treated   Hyperlipidemia    Prostate cancer [facility]) 1994   radical prostatectomy Dr. Gentry Fitz   Right inguinal hernia    Seasonal allergies    Sinusitis acute      Family History  Problem Relation Age of Onset   Prostate cancer Paternal Uncle    Prostate cancer Cousin    Pneumonia Father        aspiration   Other Other 32       smoker. sudden death unclear cause   Colon cancer Neg Hx      Past Surgical History:  Procedure Laterality Date   APPENDECTOMY     CATARACT EXTRACTION, BILATERAL Bilateral 2018   CHOLECYSTECTOMY  05/2009   INGUINAL HERNIA REPAIR Right 04/13/2020   Procedure: OPEN RIGHT INGUINAL HERNIA REPAIR;  Surgeon: Dwan Bolt, MD;  Location: [facility];  Service: General;  Laterality: Right;   prostatectomy  1994   DR.MOHLER [facility]   TONSILLECTOMY      Social History   Socioeconomic History    Marital status: Married    Spouse name: Not on file   Number of children: Not on file   Years of education: Not on file   Highest education level: Not on file  Occupational History    Comment: currently volunteers as musician  Tobacco Use   Smoking status: Never   Smokeless tobacco: Never  Vaping Use   Vaping Use: Never used  Substance and Sexual Activity   Alcohol use: Yes    Alcohol/week: 7.0 standard drinks    Types: 7 Standard drinks or equivalent per week    Comment: glass wine with dinner   Drug use: Yes    Types: Amyl nitrate, IV   Sexual activity: Not on file  Other Topics Concern   Not on file  Social History Narrative   From [place]    Came down to [facility] for graduate studies- Phd in botany.    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       Married 1985.1st marriage. No kids. No pets.       Hobbies- above + gardening      Social Determinants of Health   Financial Resource Strain: Low Risk    Difficulty of Paying Living Expenses: Not hard at all  Food Insecurity: No Food Insecurity   Worried About Charity fundraiser in the Last Year: Never true   Ran Out of Food in the Last Year: Never true  Transportation Needs: No Transportation Needs   Lack of Transportation (Medical): No   Lack of Transportation (Non-Medical): No  Physical Activity: Sufficiently Active   Days of Exercise per Week: 4 days   Minutes of Exercise per Session: 60 min  Stress: No Stress Concern Present   Feeling of Stress : Not at all  Social Connections: Moderately Integrated   Frequency of Communication with Friends and Family: Three times a week   Frequency of Social Gatherings with Friends and Family: Three times a week   Attends Religious Services: Never   Active Member of Clubs or Organizations: Yes   Attends Archivist Meetings: 1 to 4 times per  year   Marital Status: Married  Human resources officer Violence: Not At Risk   Fear of Current or Ex-Partner: No   Emotionally Abused: No   Physically Abused: No   Sexually Abused: No     No Known Allergies   Outpatient Medications Prior to Visit  Medication Sig Dispense Refill   cetirizine (ZYRTEC) 10 MG tablet Take 10 mg by mouth daily.     docusate sodium (COLACE) 100 MG capsule Take 100 mg by mouth daily.     guaiFENesin (MUCINEX) 600 MG 12 hr tablet Take by mouth 2 (two) times daily as needed.     MELATONIN PO Take by mouth as needed.     Multiple Vitamin (MULTIVITAMIN WITH MINERALS) TABS tablet Take 1 tablet by mouth daily.     naproxen sodium (ALEVE) 220 MG tablet Take 220 mg by mouth as needed.     Omega-3 Fatty Acids (FISH OIL) 1000 MG CAPS Take by mouth.     PFIZER COVID-19 VAC BIVALENT injection      No facility-administered medications prior to visit.    Review of Systems  Constitutional:  Negative for chills, fever, malaise/fatigue and weight loss.  HENT:  Negative for hearing loss, sore throat and tinnitus.   Eyes:  Negative for blurred vision and double vision.  Respiratory:  Positive for cough and sputum production. Negative for hemoptysis, shortness of breath, wheezing and stridor.   Cardiovascular:  Negative for chest pain, palpitations, orthopnea, leg swelling and PND.  Gastrointestinal:  Negative for abdominal pain, constipation, diarrhea, heartburn, nausea and vomiting.  Genitourinary:  Negative for dysuria, hematuria and urgency.  Musculoskeletal:  Negative for joint pain and myalgias.  Skin:  Negative for itching and rash.  Neurological:  Negative for dizziness, tingling, weakness and headaches.  Endo/Heme/Allergies:  Negative for environmental allergies. Does not bruise/bleed easily.  Psychiatric/Behavioral:  Negative for depression. The patient is not nervous/anxious and does not have insomnia.   All other systems reviewed and are negative.   Objective:   Physical Exam Vitals reviewed.  Constitutional:      General: He is not in acute distress.    Appearance: He is well-developed.  HENT:     Head: Normocephalic and atraumatic.  Eyes:     General: No scleral icterus.    Conjunctiva/sclera: Conjunctivae normal.     Pupils: Pupils are equal, round, and reactive to light.  Neck:     Vascular: No JVD.     Trachea: No tracheal deviation.  Cardiovascular:     Rate and Rhythm: Normal rate and regular rhythm.     Heart sounds: Normal heart sounds. No murmur  heard. Pulmonary:     Effort: Pulmonary effort is normal. No tachypnea, accessory muscle usage or respiratory distress.     Breath sounds: No stridor. No wheezing, rhonchi or rales.  Abdominal:     General: There is no distension.     Palpations: Abdomen is soft.     Tenderness: There is no abdominal tenderness.  Musculoskeletal:        General: No tenderness.     Cervical back: Neck supple.  Lymphadenopathy:     Cervical: No cervical adenopathy.  Skin:    General: Skin is warm and dry.     Capillary Refill: Capillary refill takes less than 2 seconds.     Findings: No rash.  Neurological:     Mental Status: He is alert and oriented to person, place, and time.  Psychiatric:        Behavior: Behavior normal.     Vitals:   07/23/21 1416  BP: 122/78  Pulse: 99  Temp: 97.7 F (36.5 C)  TempSrc: Oral  SpO2: 98%  Weight: 157 lb (71.2 kg)  Height: 6\' 1"  (1.854 m)   98% on RA BMI Readings from Last 3 Encounters:  07/23/21 20.71 kg/m  02/09/21 20.00 kg/m  04/13/20 19.42 kg/m   Wt Readings from Last 3 Encounters:  07/23/21 157 lb (71.2 kg)  02/09/21 155 lb 12.8 oz (70.7 kg)  04/13/20 151 lb 3.8 oz (68.6 kg)     CBC    Component Value Date/Time   WBC 7.2 02/09/2021 1436   RBC 4.66 02/09/2021 1436   HGB 14.3 02/09/2021 1436   HCT 42.1 02/09/2021 1436   PLT 237.0 02/09/2021 1436   MCV 90.3 02/09/2021 1436   MCH 30.8 02/04/2020 1605   MCHC 33.9 02/09/2021 1436    RDW 13.7 02/09/2021 1436   LYMPHSABS 1.7 02/09/2021 1436   MONOABS 0.6 02/09/2021 1436   EOSABS 0.1 02/09/2021 1436   BASOSABS 0.0 02/09/2021 1436     Chest Imaging: November nuclear medicine pet imaging, PSA specific: Progressive nodularity within the peripheral portions of the right and left lung.  Have low-level metabolic uptake on this imaging. The patient's images have been independently reviewed by me.    Pulmonary Functions Testing Results: No flowsheet data found.  FeNO:   Pathology:   Echocardiogram:   Heart Catheterization:     Assessment & Plan:     ICD-10-CM   1. Multiple nodules of lung  R91.8 CT Super D Chest Wo Contrast    2. Prostate cancer [facility])  C61       Discussion:  This is a 81 year old gentleman multiple pulmonary nodules predominantly within the bases of his lungs all have low-level PET uptake this is on a PSA specific PET scan.  He has other inflammatory changes in the lungs including areas of bronchiectasis and other small nodules.  Plan: I explained to him today in the office suspect are probably dealing with an inflammatory etiology. We could however be dealing with malignancy but I think that is less likely in the setting of the other associated parenchymal changes in the lung. We will start with sputum cultures to see if anything grows. His PET scan was in November so we will go ahead and get a repeat noncontrasted CT scan of the chest. If the CT shows progression of disease or there are any other worrisome findings I explained that we can probably consider bronchoscopy with tissue sampling and cultures to help determine etiology of the nodules. Patient is  agreeable to this plan.   Current Outpatient Medications:    cetirizine (ZYRTEC) 10 MG tablet, Take 10 mg by mouth daily., Disp: , Rfl:    docusate sodium (COLACE) 100 MG capsule, Take 100 mg by mouth daily., Disp: , Rfl:    guaiFENesin (MUCINEX) 600 MG 12 hr tablet, Take by mouth 2  (two) times daily as needed., Disp: , Rfl:    MELATONIN PO, Take by mouth as needed., Disp: , Rfl:    Multiple Vitamin (MULTIVITAMIN WITH MINERALS) TABS tablet, Take 1 tablet by mouth daily., Disp: , Rfl:    naproxen sodium (ALEVE) 220 MG tablet, Take 220 mg by mouth as needed., Disp: , Rfl:    Omega-3 Fatty Acids (FISH OIL) 1000 MG CAPS, Take by mouth., Disp: , Rfl:    PFIZER COVID-19 VAC BIVALENT injection, , Disp: , Rfl:    Garner Nash, DO [facility] 07/23/2021 2:27 PM

## 2021-07-30 ENCOUNTER — Ambulatory Visit (HOSPITAL_COMMUNITY)
Admission: RE | Admit: 2021-07-30 | Discharge: 2021-07-30 | Disposition: A | Discharge status: Home / Self Care | Payer: PPO | Source: Ambulatory Visit | Attending: Pulmonary Disease | Admitting: Pulmonary Disease

## 2021-07-30 ENCOUNTER — Other Ambulatory Visit: Payer: Self-pay

## 2021-07-30 DIAGNOSIS — J479 Bronchiectasis, uncomplicated: Secondary | ICD-10-CM | POA: Diagnosis not present

## 2021-07-30 DIAGNOSIS — I700 Atherosclerosis of aorta: Secondary | ICD-10-CM | POA: Diagnosis not present

## 2021-07-30 DIAGNOSIS — R918 Other nonspecific abnormal finding of lung field: Secondary | ICD-10-CM | POA: Insufficient documentation

## 2021-07-30 DIAGNOSIS — R911 Solitary pulmonary nodule: Secondary | ICD-10-CM | POA: Diagnosis not present

## 2021-08-13 ENCOUNTER — Ambulatory Visit: Payer: PPO | Admitting: Pulmonary Disease

## 2021-08-13 ENCOUNTER — Telehealth: Payer: Self-pay | Admitting: Family Medicine

## 2021-08-13 ENCOUNTER — Encounter: Payer: Self-pay | Admitting: Pulmonary Disease

## 2021-08-13 ENCOUNTER — Other Ambulatory Visit: Payer: Self-pay

## 2021-08-13 VITALS — BP 142/88 | HR 78 | Temp 98.1°F | Ht 73.0 in | Wt 158.4 lb

## 2021-08-13 DIAGNOSIS — R918 Other nonspecific abnormal finding of lung field: Secondary | ICD-10-CM

## 2021-08-13 DIAGNOSIS — A310 Pulmonary mycobacterial infection: Secondary | ICD-10-CM

## 2021-08-13 DIAGNOSIS — J479 Bronchiectasis, uncomplicated: Secondary | ICD-10-CM

## 2021-08-13 NOTE — Patient Instructions (Signed)
Thank you for visiting Dr. Valeta Harms at [facility]. Today we recommend the following:  Orders Placed This Encounter  Procedures   Ambulatory referral to Infectious Disease   Orders for albuterol nebulizer orders with albuterol solution order Flutter valve to be used 3-4 times per day   Return in about 6 months (around 02/10/2022) for with APP or Dr. Valeta Harms.    Please do your part to reduce the spread of COVID-19.

## 2021-08-13 NOTE — Telephone Encounter (Signed)
Pt stated he received a call from our office. There is no documentation of such call. Pt aware Dr Yong Channel is out of the office.

## 2021-08-13 NOTE — Progress Notes (Signed)
Synopsis: Referred in January 2023 for abnormal PET scan by Marin Olp, MD  Subjective:   PATIENT ID: [name] GENDER: male DOB: 1941-02-25, MRN: [id number]  Chief Complaint  Patient presents with   Follow-up    Follow up.     [name], past medical history of prostate cancer, status post radical prostatectomy, 1999.  Patient is a non-smoker.Patient recently had a PSA specific PET scan completed by urology.  This revealed residual area of carcinoma in the posterior aspect of the prostate gland.  There was progressive nodularity in the right and left lung nodules clustered together with recommendations was consideration of tissue sampling on the PET scan felt to be more inflammatory in nature.  Patient has daily cough and sputum production.  Occasionally has trouble clearing his throat.  Lifelong non-smoker.  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  Is multiple pulmonary nodules with associated varicoid cylindric bronchiectasis is consistent with pulmonary mycobacterial disease.  We talked about this today in the office and reviewed his recent CT imaging.  He still has ongoing cough and sputum production.   Past Medical History:  Diagnosis Date   Adenoma 2008   serrated adenoma of colon   Diverticulosis    GERD (gastroesophageal reflux disease)    H. Pylori related-gone when treated   Hyperlipidemia    Prostate cancer [facility]) 1994   radical prostatectomy Dr. Gentry Fitz   Right inguinal hernia    Seasonal allergies    Sinusitis acute      Family History  Problem Relation Age of Onset   Prostate cancer Paternal Uncle    Prostate cancer Cousin    Pneumonia Father        aspiration   Other Other 7       smoker. sudden death unclear cause   Colon cancer Neg Hx      Past Surgical History:  Procedure Laterality Date   APPENDECTOMY     CATARACT EXTRACTION, BILATERAL Bilateral 2018   CHOLECYSTECTOMY  05/2009    INGUINAL HERNIA REPAIR Right 04/13/2020   Procedure: OPEN RIGHT INGUINAL HERNIA REPAIR;  Surgeon: Dwan Bolt, MD;  Location: [facility];  Service: General;  Laterality: Right;   prostatectomy  1994   [facility]   TONSILLECTOMY      Social History   Socioeconomic History   Marital status: Married    Spouse name: Not on file   Number of children: Not on file   Years of education: Not on file   Highest education level: Not on file  Occupational History    Comment: currently volunteers as musician  Tobacco Use   Smoking status: Never   Smokeless tobacco: Never  Vaping Use   Vaping Use: Never used  Substance and Sexual Activity   Alcohol use: Yes    Alcohol/week: 7.0 standard drinks    Types: 7 Standard drinks or equivalent per week    Comment: glass wine with dinner   Drug use: Yes    Types: Amyl nitrate, IV   Sexual activity: Not on file  Other Topics Concern   Not on file  Social History Narrative   From [place]    Came down to [employer] for graduate studies- Phd in botany.    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  Married 1985.1st marriage. No kids. No pets.       Hobbies- above + gardening      Social Determinants of Health   Financial Resource Strain: Low Risk    Difficulty of Paying Living Expenses: Not hard at all  Food Insecurity: No Food Insecurity   Worried About Charity fundraiser in the Last Year: Never true   Arboriculturist in the Last Year: Never true  Transportation Needs: No Transportation Needs   Lack of Transportation (Medical): No   Lack of Transportation (Non-Medical): No  Physical Activity: Sufficiently Active   Days of Exercise per Week: 4 days   Minutes of Exercise per Session: 60 min  Stress: No Stress Concern Present   Feeling of Stress : Not at all  Social Connections:  Moderately Integrated   Frequency of Communication with Friends and Family: Three times a week   Frequency of Social Gatherings with Friends and Family: Three times a week   Attends Religious Services: Never   Active Member of Clubs or Organizations: Yes   Attends Archivist Meetings: 1 to 4 times per year   Marital Status: Married  Human resources officer Violence: Not At Risk   Fear of Current or Ex-Partner: No   Emotionally Abused: No   Physically Abused: No   Sexually Abused: No     No Known Allergies   Outpatient Medications Prior to Visit  Medication Sig Dispense Refill   cetirizine (ZYRTEC) 10 MG tablet Take 10 mg by mouth daily.     docusate sodium (COLACE) 100 MG capsule Take 100 mg by mouth daily.     guaiFENesin (MUCINEX) 600 MG 12 hr tablet Take by mouth 2 (two) times daily as needed.     MELATONIN PO Take by mouth as needed.     Multiple Vitamin (MULTIVITAMIN WITH MINERALS) TABS tablet Take 1 tablet by mouth daily.     naproxen sodium (ALEVE) 220 MG tablet Take 220 mg by mouth as needed.     Omega-3 Fatty Acids (FISH OIL) 1000 MG CAPS Take by mouth.     PFIZER COVID-19 VAC BIVALENT injection      No facility-administered medications prior to visit.    Review of Systems  Constitutional:  Negative for chills, fever, malaise/fatigue and weight loss.  HENT:  Negative for hearing loss, sore throat and tinnitus.   Eyes:  Negative for blurred vision and double vision.  Respiratory:  Positive for cough, sputum production and shortness of breath. Negative for hemoptysis, wheezing and stridor.   Cardiovascular:  Negative for chest pain, palpitations, orthopnea, leg swelling and PND.  Gastrointestinal:  Negative for abdominal pain, constipation, diarrhea, heartburn, nausea and vomiting.  Genitourinary:  Negative for dysuria, hematuria and urgency.  Musculoskeletal:  Negative for joint pain and myalgias.  Skin:  Negative for itching and rash.  Neurological:  Negative for  dizziness, tingling, weakness and headaches.  Endo/Heme/Allergies:  Negative for environmental allergies. Does not bruise/bleed easily.  Psychiatric/Behavioral:  Negative for depression. The patient is not nervous/anxious and does not have insomnia.   All other systems reviewed and are negative.   Objective:  Physical Exam Vitals reviewed.  Constitutional:      General: He is not in acute distress.    Appearance: He is well-developed.  HENT:     Head: Normocephalic and atraumatic.  Eyes:     General: No scleral icterus.    Conjunctiva/sclera: Conjunctivae normal.     Pupils: Pupils are equal, round,  and reactive to light.  Neck:     Vascular: No JVD.     Trachea: No tracheal deviation.  Cardiovascular:     Rate and Rhythm: Normal rate and regular rhythm.     Heart sounds: Normal heart sounds. No murmur heard. Pulmonary:     Effort: Pulmonary effort is normal. No tachypnea, accessory muscle usage or respiratory distress.     Breath sounds: No stridor. Rhonchi present. No wheezing or rales.     Comments: Bilateral rhonchi Abdominal:     General: There is no distension.     Palpations: Abdomen is soft.     Tenderness: There is no abdominal tenderness.  Musculoskeletal:        General: No tenderness.     Cervical back: Neck supple.  Lymphadenopathy:     Cervical: No cervical adenopathy.  Skin:    General: Skin is warm and dry.     Capillary Refill: Capillary refill takes less than 2 seconds.     Findings: No rash.  Neurological:     Mental Status: He is alert and oriented to person, place, and time.  Psychiatric:        Behavior: Behavior normal.     Vitals:   08/13/21 1628  BP: (!) 142/88  Pulse: 78  Temp: 98.1 F (36.7 C)  TempSrc: Oral  SpO2: 98%  Weight: 158 lb 6.4 oz (71.8 kg)  Height: _0  (1.854 m)   98% on RA BMI Readings from Last 3 Encounters:  08/13/21 20.90 kg/m  07/23/21 20.71 kg/m  02/09/21 20.00 kg/m   Wt Readings from Last 3 Encounters:   08/13/21 158 lb 6.4 oz (71.8 kg)  07/23/21 157 lb (71.2 kg)  02/09/21 155 lb 12.8 oz (70.7 kg)     CBC    Component Value Date/Time   WBC 7.2 02/09/2021 1436   RBC 4.66 02/09/2021 1436   HGB 14.3 02/09/2021 1436   HCT 42.1 02/09/2021 1436   PLT 237.0 02/09/2021 1436   MCV 90.3 02/09/2021 1436   MCH 30.8 02/04/2020 1605   MCHC 33.9 02/09/2021 1436   RDW 13.7 02/09/2021 1436   LYMPHSABS 1.7 02/09/2021 1436   MONOABS 0.6 02/09/2021 1436   EOSABS 0.1 02/09/2021 1436   BASOSABS 0.0 02/09/2021 1436     Chest Imaging: November nuclear medicine pet imaging, PSA specific: Progressive nodularity within the peripheral portions of the right and left lung.  Have low-level metabolic uptake on this imaging. The patient's images have been independently reviewed by me.    Pulmonary Functions Testing Results: No flowsheet data found.  FeNO:   Pathology:   Echocardiogram:   Heart Catheterization:     Assessment & Plan:     ICD-10-CM   1. Mycobacterium avium complex ([place])  A31.0 Ambulatory referral to Infectious Disease    2. Bronchiectasis without complication ([place])  E26.8     3. Multiple nodules of lung  R91.8       Discussion:  [name] with multiple pulmonary nodules seen within the bilateral bases all low-level uptake.  CT scan of the chest with varicoid cylindric bronchiectasis.  All of his multiple pulmonary nodules I think are related to his new diagnosis of pulmonary Mycobacterium avium complex  Plan: Referral to infectious disease to discuss treatment options for pulmonary MAC. Started flutter valve and explained to him the importance of airway clearance techniques. New prescription for a albuterol nebulizer and albuterol to use prior to his flutter valve to help  with airway clearance. Return to clinic to see [place] in 6 months.   Current Outpatient Medications:    cetirizine (ZYRTEC) 10 MG tablet, Take 10 mg by mouth daily., Disp: , Rfl:     docusate sodium (COLACE) 100 MG capsule, Take 100 mg by mouth daily., Disp: , Rfl:    guaiFENesin (MUCINEX) 600 MG 12 hr tablet, Take by mouth 2 (two) times daily as needed., Disp: , Rfl:    MELATONIN PO, Take by mouth as needed., Disp: , Rfl:    Multiple Vitamin (MULTIVITAMIN WITH MINERALS) TABS tablet, Take 1 tablet by mouth daily., Disp: , Rfl:    naproxen sodium (ALEVE) 220 MG tablet, Take 220 mg by mouth as needed., Disp: , Rfl:    Omega-3 Fatty Acids (FISH OIL) 1000 MG CAPS, Take by mouth., Disp: , Rfl:    PFIZER COVID-19 VAC BIVALENT injection, , Disp: , Rfl:    Garner Nash, DO [facility] 08/13/2021 4:46 PM

## 2021-08-18 DIAGNOSIS — Z9079 Acquired absence of other genital organ(s): Secondary | ICD-10-CM | POA: Insufficient documentation

## 2021-08-18 DIAGNOSIS — A310 Pulmonary mycobacterial infection: Secondary | ICD-10-CM | POA: Insufficient documentation

## 2021-08-24 ENCOUNTER — Ambulatory Visit (INDEPENDENT_AMBULATORY_CARE_PROVIDER_SITE_OTHER): Payer: PPO | Admitting: Internal Medicine

## 2021-08-24 ENCOUNTER — Other Ambulatory Visit: Payer: Self-pay

## 2021-08-24 DIAGNOSIS — Z9079 Acquired absence of other genital organ(s): Secondary | ICD-10-CM

## 2021-08-24 DIAGNOSIS — A310 Pulmonary mycobacterial infection: Secondary | ICD-10-CM

## 2021-08-24 NOTE — Progress Notes (Signed)
[facility]  Reason for Consult: Sputum culture positive for Mycobacterium avium Referring Provider: Dr. Leroy Sea Icard  Assessment: I suspect that he does have smoldering, symptomatic Mycobacterium avium pneumonia contributing to his chronic symptoms and progressive tree-in-bud nodularity.  However he has not noted significant worsening of his symptoms and his quality of life and exercise tolerance is quite good.  I told him that there are several management options here including watchful waiting over the next several months versus starting a 3 drug regimen of azithromycin, ethambutol and rifampin 3 times weekly.  We had extensive discussion about both options and he has elected for watchful waiting for now.  I asked him to keep a very simple journal looking for changing trends in his symptoms.  Plan: Hold off on treatment for now Follow-up in 6 weeks  Patient Active Problem List   Diagnosis Date Noted   Mycobacterium avium infection ([place]) 08/18/2021    Priority: High   H/O radical prostatectomy 08/18/2021   Varicose vein of leg 12/08/2017   Hearing loss 12/08/2017   Hemorrhagic prepatellar bursitis of left knee 09/16/2015   Elevated TSH 06/02/2014   History of colon polyps    Basal cell cancer 04/08/2013   Prostate cancer ([name])    Hyperlipidemia 03/10/2009   ALLERGIC RHINITIS 08/06/2007    Patient's Medications  New Prescriptions   No medications on file  Previous Medications   CETIRIZINE (ZYRTEC) 10 MG TABLET    Take 10 mg by mouth daily.   DOCUSATE SODIUM (COLACE) 100 MG CAPSULE    Take 100 mg by mouth daily.   GUAIFENESIN (MUCINEX) 600 MG 12 HR TABLET    Take by mouth 2 (two) times daily as needed.   MELATONIN PO    Take by mouth as needed.   MULTIPLE VITAMIN (MULTIVITAMIN WITH MINERALS) TABS TABLET    Take 1 tablet by mouth daily.   NAPROXEN SODIUM (ALEVE) 220 MG TABLET    Take 220 mg by mouth as needed.   OMEGA-3 FATTY ACIDS (FISH OIL)  1000 MG CAPS    Take by mouth.  Modified Medications   No medications on file  Discontinued Medications   PFIZER COVID-19 VAC BIVALENT INJECTION        HPI: Juan Lin is a 81 y.o. male with a lifelong history of "allergies".  He says that he cannot recall a time when he did not cough and clear his throat on a daily basis.  He is a never smoker other than occasional marijuana in the past.  He has never had pneumonia or been hospitalized with other complications related to his lung function.  He is a retired Psychologist, clinical at [employer].  He remains very active.  He maintains a large garden.  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  He volunteers at [facility] playing the piano.  He has not noted any recent increase in his cough.  He denies having any limitations due to shortness of breath.  However, his wife did note that he recently mentioned that he felt a little more fatigued at the end of his dance classes.  His appetite is good and he has not had any change in his weight.  He has not had any fever, chills or sweats.  He has a remote history of prostate cancer and underwent prostatectomy in 1999.  He had a follow-up PET scan last November which showed  uptake in the prostate and progressive nodularity in his lungs bilaterally.  He had a sputum collected for AFB testing in early January.  His smear was negative but cultures grew Mycobacterium avium.  He had a chest CT scan on 08/02/2021 which showed bilateral tree-in-bud nodularity that has progressed since 2019.  Review of Systems: Review of Systems  Constitutional:  Positive for malaise/fatigue. Negative for chills, diaphoresis, fever and weight loss.  Respiratory:  Positive for cough. Negative for hemoptysis, sputum production, shortness of breath and wheezing.   Cardiovascular:  Negative for chest pain.     Past Medical History:  Diagnosis Date   Adenoma 2008   serrated adenoma of colon    Diverticulosis    GERD (gastroesophageal reflux disease)    H. Pylori related-gone when treated   Hyperlipidemia    Prostate cancer [facility]) 1994   radical prostatectomy Dr. Gentry Fitz   Right inguinal hernia    Seasonal allergies    Sinusitis acute     Social History   Tobacco Use   Smoking status: Never   Smokeless tobacco: Never  Vaping Use   Vaping Use: Never used  Substance Use Topics   Alcohol use: Yes    Alcohol/week: 7.0 standard drinks    Types: 7 Standard drinks or equivalent per week    Comment: glass wine with dinner   Drug use: Yes    Types: Amyl nitrate, IV    Family History  Problem Relation Age of Onset   Prostate cancer Paternal Uncle    Prostate cancer Cousin    Pneumonia Father        aspiration   Other Other 4       smoker. sudden death unclear cause   Colon cancer Neg Hx    No Known Allergies  OBJECTIVE: Vitals:   08/24/21 1610  Weight: 156 lb (70.8 kg)  Height: 6\' 4"  (1.93 m)   Body mass index is 18.99 kg/m.   Physical Exam Constitutional:      Comments: He is very pleasant and talkative.  Cardiovascular:     Rate and Rhythm: Normal rate and regular rhythm.     Heart sounds: No murmur heard. Pulmonary:     Effort: Pulmonary effort is normal.     Breath sounds: Rales present. No wheezing or rhonchi.     Comments: He has a few crackles in his lung bases bilaterally. Psychiatric:        Mood and Affect: Mood normal.    Microbiology: No results found for this or any previous visit (from the past 240 hour(s)).  Michel Bickers, MD [facility] [phone] pager   [phone] cell 08/24/2021, 4:11 PM

## 2021-08-25 ENCOUNTER — Telehealth: Payer: Self-pay

## 2021-08-25 NOTE — Telephone Encounter (Signed)
Per MD called [facility] to add antibiotics susceptibility testing to AFB ID by DNA probe. Test was added. Results will be faxed to [facility].  ?P: [phone] ? ?Leatrice Jewels, RMA ? ?

## 2021-09-08 LAB — MAC SUSCEPTIBILITY BROTH
Ciprofloxacin: >8
Clarithromycin: 1
Doxycycline: >8
Linezolid: 8
Minocycline: >8
Rifabutin: 0.25
Rifampin: 4
Streptomycin: 32

## 2021-09-08 LAB — SPECIMEN STATUS REPORT

## 2021-09-20 ENCOUNTER — Telehealth: Payer: Self-pay | Admitting: Pulmonary Disease

## 2021-09-20 NOTE — Telephone Encounter (Signed)
Teresa from [facility] called with patient AFB positive results. Call back [phone] ext [phone] Ask for teresa and provide patients name. ? ? ?

## 2021-09-20 NOTE — Telephone Encounter (Signed)
Received call report from [place] with [facility]. Positive culture for AFB. ? ?Dr. Valeta Harms, please advise. Thanks ?

## 2021-09-21 LAB — AFB CULTURE WITH SMEAR (NOT AT ARMC)
Acid Fast Culture: POSITIVE — AB
Acid Fast Smear: NEGATIVE

## 2021-09-21 LAB — AFB ID BY DNA PROBE
M avium complex: POSITIVE — AB
M tuberculosis complex: NEGATIVE

## 2021-09-21 NOTE — Telephone Encounter (Signed)
Noted  

## 2021-10-04 ENCOUNTER — Ambulatory Visit (INDEPENDENT_AMBULATORY_CARE_PROVIDER_SITE_OTHER): Payer: PPO | Admitting: Physician Assistant

## 2021-10-04 ENCOUNTER — Encounter: Payer: Self-pay | Admitting: Physician Assistant

## 2021-10-04 ENCOUNTER — Other Ambulatory Visit (HOSPITAL_COMMUNITY): Payer: Self-pay

## 2021-10-04 VITALS — BP 147/81 | HR 77 | Temp 97.9°F | Ht 76.0 in | Wt 159.0 lb

## 2021-10-04 DIAGNOSIS — W57XXXA Bitten or stung by nonvenomous insect and other nonvenomous arthropods, initial encounter: Secondary | ICD-10-CM

## 2021-10-04 DIAGNOSIS — S80862A Insect bite (nonvenomous), left lower leg, initial encounter: Secondary | ICD-10-CM | POA: Diagnosis not present

## 2021-10-04 MED ORDER — DOXYCYCLINE HYCLATE 100 MG PO TABS
100.0000 mg | ORAL_TABLET | Freq: Two times a day (BID) | ORAL | 0 refills | Status: DC
  Filled 2021-10-04: qty 20, 10d supply, fill #0

## 2021-10-04 MED ORDER — HYDROCORTISONE 2.5 % EX LOTN
TOPICAL_LOTION | Freq: Two times a day (BID) | CUTANEOUS | 0 refills | Status: AC
  Filled 2021-10-04: qty 59, 14d supply, fill #0

## 2021-10-04 NOTE — Progress Notes (Signed)
? ?Subjective:  ? ? Patient ID: Juan Lin, male    DOB: 15-Mar-1941, 81 y.o.   MRN: [id number] ? ?Chief Complaint  ?Patient presents with  ? Tick bite  ?  Lower left leg; He states working in his garden Thursday, and was bitten by a Tick. He ws ale to remove the whole Tick with tweezers. He then developed a rash.   ? Rash  ? ? ?Rash ? ?Patient is in today for tick bite of left lower extremity.  Noticed a lone star tick on his leg on 09/30/2021.  States that he was able to remove the entire tick including mouthparts with tweezers.  He is not sure how long the tick was on his leg for.  He denies any headaches or fevers or chills.  No nausea or body aches.  He quickly developed a red rash around the bite, but it does not have a central clearing and it does not seem to be expanding any further.  Some itching to the area.  He has been using hydrocortisone cream but not sure what strength. ? ?Past Medical History:  ?Diagnosis Date  ? Adenoma 2008  ? serrated adenoma of colon  ? Diverticulosis   ? GERD (gastroesophageal reflux disease)   ? H. Pylori related-gone when treated  ? Hyperlipidemia   ? Prostate cancer ([place]) 1994  ? radical prostatectomy Dr. Gentry Fitz  ? Right inguinal hernia   ? Seasonal allergies   ? Sinusitis acute   ? ? ?Past Surgical History:  ?Procedure Laterality Date  ? APPENDECTOMY    ? CATARACT EXTRACTION, BILATERAL Bilateral 2018  ? CHOLECYSTECTOMY  05/2009  ? INGUINAL HERNIA REPAIR Right 04/13/2020  ? Procedure: OPEN RIGHT INGUINAL HERNIA REPAIR;  Surgeon: Dwan Bolt, MD;  Location: [place] [facility];  Service: General;  Laterality: Right;  ? prostatectomy  1994  ? [facility]  ? TONSILLECTOMY    ? ? ?Family History  ?Problem Relation Age of Onset  ? Prostate cancer Paternal Uncle   ? Prostate cancer Cousin   ? Pneumonia Father   ?     aspiration  ? Other Other 46  ?     smoker. sudden death unclear cause  ? Colon cancer Neg Hx   ? ? ?Social History  ? ?Tobacco Use  ? Smoking  status: Never  ? Smokeless tobacco: Never  ?Vaping Use  ? Vaping Use: Never used  ?Substance Use Topics  ? Alcohol use: Yes  ?  Alcohol/week: 7.0 standard drinks  ?  Types: 7 Standard drinks or equivalent per week  ?  Comment: glass wine with dinner  ? Drug use: Yes  ?  Types: Amyl nitrate, IV  ?  ? ?No Known Allergies ? ?Review of Systems  ?Skin:  Positive for rash.  ?NEGATIVE UNLESS OTHERWISE INDICATED IN HPI ? ? ?   ?Objective:  ?  ? ?BP (!) 147/81   Pulse 77   Temp 97.9 ?F (36.6 ?C) (Temporal)   Ht '6\' 4"'$  (1.93 m)   Wt 159 lb (72.1 kg)   SpO2 94%   BMI 19.35 kg/m?  ? ?Wt Readings from Last 3 Encounters:  ?10/04/21 159 lb (72.1 kg)  ?08/24/21 156 lb (70.8 kg)  ?08/13/21 158 lb 6.4 oz (71.8 kg)  ? ? ?BP Readings from Last 3 Encounters:  ?10/04/21 (!) 147/81  ?08/24/21 119/84  ?08/13/21 (!) 142/88  ?  ? ?Physical Exam ?Skin: ?   Comments: LLE medial calf there  is a small scabbed area c/w hx of tick bite with surrounding superficial dermatitis. No central clearing. No streaking. No pain with palpation.   ? ? ?   ?Assessment & Plan:  ? ?Problem List Items Addressed This Visit   ?None ?Visit Diagnoses   ? ? Tick bite of left lower leg, initial encounter    -  Primary  ? ?  ? ? ? ?Meds ordered this encounter  ?Medications  ? doxycycline (VIBRA-TABS) 100 MG tablet  ?  Sig: Take 1 tablet (100 mg total) by mouth 2 (two) times daily for 10 days  ?  Dispense:  20 tablet  ?  Refill:  0  ? hydrocortisone 2.5 % lotion  ?  Sig: Apply topically 2 (two) times daily for 14 days.  ?  Dispense:  59 mL  ?  Refill:  0  ? ?1. Tick bite of left lower leg, initial encounter ?Appears to be a local reaction to tick bite.  He flies out to [place] and is nervous about taking antibiotics.  Gave prescription for doxycycline 100 mg to take twice daily for 10 days in the event that he starts to notice a bull's-eye rash appearing and/or constitutional symptoms as we discussed.  For now, he will continue to monitor and can use  hydrocortisone lotion 2.5% sent to pharmacy for him.  Also provided information about possible alpha gal with [place] tick bites.. ? ? ?F/up prn ? ?This note was prepared with assistance of Systems analyst. Occasional wrong-word or sound-a-like substitutions may have occurred due to the inherent limitations of voice recognition software. ? ? ?Ikhlas Albo M Tranquilino Fischler, PA-C ?

## 2021-10-14 ENCOUNTER — Telehealth: Payer: Self-pay | Admitting: Family Medicine

## 2021-10-14 NOTE — Telephone Encounter (Signed)
Pt states his rash has cleared up and is discontinuing the hydrocortisone. Just an FYI, he wanted to make sure [place] knew ?

## 2021-10-14 NOTE — Telephone Encounter (Signed)
Noted, thanks!

## 2021-10-20 ENCOUNTER — Encounter: Payer: Self-pay | Admitting: Internal Medicine

## 2021-10-20 ENCOUNTER — Other Ambulatory Visit: Payer: Self-pay

## 2021-10-20 ENCOUNTER — Ambulatory Visit: Payer: PPO | Admitting: Internal Medicine

## 2021-10-20 DIAGNOSIS — A310 Pulmonary mycobacterial infection: Secondary | ICD-10-CM | POA: Diagnosis not present

## 2021-10-20 NOTE — Assessment & Plan Note (Signed)
His recent worsened cough may be due to seasonal allergies, a simple flare of his underlying bronchiectasis or progression of Mycobacterium avium infection from asymptomatic colonization to slowly smoldering pneumonia.  I will repeat a sputum AFB smear and culture and see him back in 6 weeks. ?

## 2021-10-20 NOTE — Progress Notes (Signed)
?  ? ? ? ? ?[facility] ? ?Patient Active Problem List  ? Diagnosis Date Noted  ? Mycobacterium avium infection ([place]) 08/18/2021  ?  Priority: High  ? H/O radical prostatectomy 08/18/2021  ? Varicose vein of leg 12/08/2017  ? Hearing loss 12/08/2017  ? Hemorrhagic prepatellar bursitis of left knee 09/16/2015  ? Elevated TSH 06/02/2014  ? History of colon polyps   ? Basal cell cancer 04/08/2013  ? Prostate cancer ([place])   ? Hyperlipidemia 03/10/2009  ? ALLERGIC RHINITIS 08/06/2007  ? ? ?Patient's Medications  ?New Prescriptions  ? No medications on file  ?Previous Medications  ? CETIRIZINE (ZYRTEC) 10 MG TABLET    Take 10 mg by mouth daily.  ? DOCUSATE SODIUM (COLACE) 100 MG CAPSULE    Take 100 mg by mouth daily.  ? DOXYCYCLINE (VIBRA-TABS) 100 MG TABLET    Take 1 tablet (100 mg total) by mouth 2 (two) times daily for 10 days  ? GUAIFENESIN (MUCINEX) 600 MG 12 HR TABLET    Take by mouth 2 (two) times daily as needed.  ? MELATONIN PO    Take by mouth as needed.  ? MULTIPLE VITAMIN (MULTIVITAMIN WITH MINERALS) TABS TABLET    Take 1 tablet by mouth daily.  ? NAPROXEN SODIUM (ALEVE) 220 MG TABLET    Take 220 mg by mouth as needed.  ? OMEGA-3 FATTY ACIDS (FISH OIL) 1000 MG CAPS    Take by mouth.  ?Modified Medications  ? No medications on file  ?Discontinued Medications  ? No medications on file  ? ? ?Subjective: ?He is in for his routine follow-up visit.  He has a lifelong history of "allergies".  He says that he cannot recall a time when he did not cough and clear his throat on a daily basis.  He is a never smoker other than occasional marijuana in the past.  He has never had pneumonia or been hospitalized with other complications related to his lung function.  He is a retired Psychologist, clinical at [employer].  He remains very active.  He maintains a large garden.  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  He volunteers at 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 ?   ?He has not noted any recent increase in his cough.  He denies having any limitations due to shortness of breath.  However, his wife did note that he recently mentioned that he felt a little more fatigued at the end of his dance classes.  His appetite is good and he has not had any change in his weight.  He has not had any fever, chills or sweats. ?  ?He has a remote history of prostate cancer and underwent prostatectomy in 1999.  He had a follow-up PET scan last November which showed uptake in the prostate and progressive nodularity in his lungs bilaterally.  He had a sputum collected for AFB testing in early January.  His smear was negative but cultures grew Mycobacterium avium.  He had a chest CT scan on 08/02/2021 which showed bilateral tree-in-bud nodularity that has progressed since 2019. ? ?When I saw him for the first time on 08/24/2021 I discussed management options.  We both agreed that he was not sufficiently symptomatic to warrant 3 drug antibiotic therapy. ? ?He and his wife, [name], recently returned from a vacation to [place].  Over the last week he has noted some worsened cough and says that he frequently needs to "  clear his throat".  He is bringing up small amounts of white sputum.  He has not had any fever, chills or sweats.  He has not noted any significant shortness of breath.  [name] does note that he has been sleeping more over the past 6 months and says that he was not as interested in climbing stairs while in [place] as he normally would have been.  He says that that is because of some pain in his right knee.  When he started coughing more last week he started wearing a mask when out of doors and noted improvement. ? ?Review of Systems: ?Review of Systems  ?Constitutional:  Negative for chills, diaphoresis, fever, malaise/fatigue and weight loss.  ?Respiratory:  Positive for cough and sputum production. Negative for hemoptysis, shortness of breath and wheezing.   ?Cardiovascular:  Negative for chest  pain.  ? ?Past Medical History:  ?Diagnosis Date  ? Adenoma 2008  ? serrated adenoma of colon  ? Diverticulosis   ? GERD (gastroesophageal reflux disease)   ? H. Pylori related-gone when treated  ? Hyperlipidemia   ? Prostate cancer ([place]) 1994  ? radical prostatectomy Dr. Gentry Fitz  ? Right inguinal hernia   ? Seasonal allergies   ? Sinusitis acute   ? ? ?Social History  ? ?Tobacco Use  ? Smoking status: Never  ? Smokeless tobacco: Never  ?Vaping Use  ? Vaping Use: Never used  ?Substance Use Topics  ? Alcohol use: Yes  ?  Alcohol/week: 7.0 standard drinks  ?  Types: 7 Standard drinks or equivalent per week  ?  Comment: glass wine with dinner  ? Drug use: Yes  ?  Types: Amyl nitrate, IV  ? ? ?Family History  ?Problem Relation Age of Onset  ? Prostate cancer Paternal Uncle   ? Prostate cancer Cousin   ? Pneumonia Father   ?     aspiration  ? Other Other 46  ?     smoker. sudden death unclear cause  ? Colon cancer Neg Hx   ? ? ?No Known Allergies ? ?Objective: ?Vitals:  ? 10/20/21 1000  ?BP: 139/84  ?Pulse: 86  ?Temp: (!) 97.3 ?F (36.3 ?C)  ?TempSrc: Oral  ?Weight: 159 lb (72.1 kg)  ? ?Body mass index is 19.35 kg/m?. ? ?Physical Exam ?Constitutional:   ?   Comments: He is talkative and in good spirits.  ?Cardiovascular:  ?   Rate and Rhythm: Normal rate and regular rhythm.  ?   Heart sounds: No murmur heard. ?Pulmonary:  ?   Effort: Pulmonary effort is normal.  ?   Breath sounds: Rales present. No wheezing or rhonchi.  ?   Comments: He has a few crackles in his left base posteriorly. ?Psychiatric:     ?   Mood and Affect: Mood normal.  ? ? ? ?Lab Results ? ?  ?Problem List Items Addressed This Visit   ? ?  ? High  ? Mycobacterium avium infection ([place])  ?  His recent worsened cough may be due to seasonal allergies, a simple flare of his underlying bronchiectasis or progression of Mycobacterium avium infection from asymptomatic colonization to slowly smoldering pneumonia.  I will repeat a sputum AFB smear and culture  and see him back in 6 weeks. ? ?  ?  ? Relevant Orders  ? MYCOBACTERIA, CULTURE, WITH FLUOROCHROME SMEAR  ? ? ? ?Michel Bickers, MD ?[facility] for [facility]ious [facility] ?[place] ?[phone] pager   [phone] cell ?10/20/2021,  10:34 AM ?

## 2021-10-29 ENCOUNTER — Other Ambulatory Visit (HOSPITAL_COMMUNITY): Payer: Self-pay

## 2021-10-29 ENCOUNTER — Telehealth: Payer: Self-pay

## 2021-10-29 ENCOUNTER — Encounter: Payer: Self-pay | Admitting: Family Medicine

## 2021-10-29 ENCOUNTER — Ambulatory Visit (INDEPENDENT_AMBULATORY_CARE_PROVIDER_SITE_OTHER): Payer: PPO | Admitting: Family Medicine

## 2021-10-29 VITALS — BP 169/95 | HR 73 | Temp 98.0°F | Ht 76.0 in | Wt 160.6 lb

## 2021-10-29 DIAGNOSIS — J309 Allergic rhinitis, unspecified: Secondary | ICD-10-CM | POA: Diagnosis not present

## 2021-10-29 DIAGNOSIS — A310 Pulmonary mycobacterial infection: Secondary | ICD-10-CM | POA: Diagnosis not present

## 2021-10-29 DIAGNOSIS — R059 Cough, unspecified: Secondary | ICD-10-CM | POA: Diagnosis not present

## 2021-10-29 MED ORDER — AMOXICILLIN-POT CLAVULANATE 875-125 MG PO TABS
1.0000 | ORAL_TABLET | Freq: Two times a day (BID) | ORAL | 0 refills | Status: DC
  Filled 2021-10-29: qty 14, 7d supply, fill #0

## 2021-10-29 MED ORDER — ALBUTEROL SULFATE HFA 108 (90 BASE) MCG/ACT IN AERS
2.0000 | INHALATION_SPRAY | Freq: Four times a day (QID) | RESPIRATORY_TRACT | 0 refills | Status: DC | PRN
  Filled 2021-10-29: qty 8.5, 25d supply, fill #0

## 2021-10-29 MED ORDER — BENZONATATE 200 MG PO CAPS
200.0000 mg | ORAL_CAPSULE | Freq: Two times a day (BID) | ORAL | 0 refills | Status: DC | PRN
  Filled 2021-10-29: qty 20, 10d supply, fill #0

## 2021-10-29 NOTE — Progress Notes (Signed)
? ?  Juan Lin is a 81 y.o. male who presents today for an office visit. ? ?Assessment/Plan:  ?New/Acute Problems: ?Cough ?No red flags.  Likely due to sinusitis though he does have known MAC chronic infection which is likely playing a role as well. Discussed with patient it is not clear how much of this may be due to a new infection versus sequela of his known MAC chronic infection.  Given the length of his symptoms we will empirically start Augmentin which should hopefully treat any underlying bronchitis as well as his sinusitis.  We will start albuterol inhaler to help with his bronchospasm.  We can also use Tessalon as needed for cough.  Encouraged hydration.  We discussed reasons to return to care.  Follow-up as needed. ? ?Chronic Problems Addressed Today: ?Mycobacterium avium infection ?Continue management per ID.  He is not yet ready to commit to long-term antibiotics.  We will avoid antibiotics that potentially could be used as a treatment for MAC.  ? ?Allergic rhinitis ?Likely contributing to above cough and sinus infection.  He will continue taking Zyrtec.  Hopefully she will have some improvement as pollen season wanes. ? ?  ?Subjective:  ?HPI: ? ?Patient here with cough for the last couple of weeks. Symptoms mostly occur at night. He has been following with ID over the last few months due to mycobacterium avium complex infection.  He was doing well and was relatively asymptomatic for quite a while.  Recently had a sputum culture done but has not yet received the results on this. ? ?He recently took a trip to [place] a few weeks ago.  During the trip he started develop a cough.  Thought this was due to pollen.  [place] and cough continue to worsen.  No fevers.  No chills.  Has a lot of sinus congestion and drainage.  Low energy.  No shortness of breath.  No chest pain.  Cough is worse at night. ? ? ?   ?  ?Objective:  ?Physical Exam: ?BP (!) 169/95 (BP Location: Left Arm)    Pulse 73   Temp 98 ?F (36.7 ?C) (Temporal)   Ht _0  (1.93 m)   Wt 160 lb 9.6 oz (72.8 kg)   SpO2 96%   BMI 19.55 kg/m?   ?Gen: No acute distress, resting comfortably ?CV: Regular rate and rhythm with no murmurs appreciated ?Pulm: Normal work of breathing.  Rales and rhonchi noted on bilateral bases.  No wheezing. ?Neuro: Grossly normal, moves all extremities ?Psych: Normal affect and thought content ? ?   ? ?Algis Greenhouse. Jerline Pain, MD ?10/29/2021 3:36 PM  ?

## 2021-10-29 NOTE — Telephone Encounter (Signed)
Call received from [facility] at [facility] communicating partial critical results for mycobacterium culture collected on 10/20/21 10:38 am.  ? ?Call was accidentally dropped prior to completing the conversation. No follow up call from [facility] received prior to clinic closure today. ? ?Per chart review in Epic, this abnormal result has already been reviewed by the provider.  ? ?Binnie Kand, RN  ?

## 2021-10-29 NOTE — Patient Instructions (Addendum)
It was very nice to see you today! ? ?Please start the augmentin and tessalon. Use the inhaler as needed. ? ?Make sure you are getting plenty of fluids.  ? ?Let us know if not getting better.  ? ?Take care, ?Dr Jerline Pain ? ?PLEASE NOTE: ? ?If you had any lab tests please let us know if you have not heard back within a few days. You may see your results on mychart before we have a chance to review them but we will give you a call once they are reviewed by [place]. If we ordered any referrals today, please let us know if you have not heard from their office within the next week.  ? ?Please try these tips to maintain a healthy lifestyle: ? ?Eat at least 3 REAL meals and 1-2 snacks per day.  Aim for no more than 5 hours between eating.  If you eat breakfast, please do so within one hour of getting up.  ? ?Each meal should contain half fruits/vegetables, one quarter protein, and one quarter carbs (no bigger than a computer mouse) ? ?Cut down on sweet beverages. This includes juice, soda, and sweet tea.  ? ?Drink at least 1 glass of water with each meal and aim for at least 8 glasses per day ? ?Exercise at least 150 minutes every week.   ?

## 2021-11-02 NOTE — Telephone Encounter (Signed)
Patient scheduled for Thursday 5/11 at 3:30. ? ?Beryle Flock, RN ? ?

## 2021-11-04 ENCOUNTER — Ambulatory Visit: Payer: PPO | Admitting: Internal Medicine

## 2021-11-04 ENCOUNTER — Other Ambulatory Visit: Payer: Self-pay

## 2021-11-04 DIAGNOSIS — A310 Pulmonary mycobacterial infection: Secondary | ICD-10-CM

## 2021-11-04 NOTE — Progress Notes (Addendum)
[facility]  Patient Active Problem List   Diagnosis Date Noted   Mycobacterium avium infection ([place]) 08/18/2021    Priority: High   H/O radical prostatectomy 08/18/2021   Varicose vein of leg 12/08/2017   Hearing loss 12/08/2017   Hemorrhagic prepatellar bursitis of left knee 09/16/2015   Elevated TSH 06/02/2014   History of colon polyps    Basal cell cancer 04/08/2013   Prostate cancer ([place])    Hyperlipidemia 03/10/2009   Allergic rhinitis 08/06/2007    Patient's Medications  New Prescriptions   No medications on file  Previous Medications   ALBUTEROL (VENTOLIN HFA) 108 (90 BASE) MCG/ACT INHALER    Inhale 2 puffs into the lungs every 6 (six) hours as needed for wheezing or shortness of breath.   AMOXICILLIN-CLAVULANATE (AUGMENTIN) 875-125 MG TABLET    Take 1 tablet by mouth 2 (two) times daily.   BENZONATATE (TESSALON) 200 MG CAPSULE    Take 1 capsule (200 mg total) by mouth 2 (two) times daily as needed for cough.   CETIRIZINE (ZYRTEC) 10 MG TABLET    Take 10 mg by mouth daily.   DOCUSATE SODIUM (COLACE) 100 MG CAPSULE    Take 100 mg by mouth daily.   GUAIFENESIN (MUCINEX) 600 MG 12 HR TABLET    Take by mouth 2 (two) times daily as needed.   MELATONIN PO    Take by mouth as needed.   MULTIPLE VITAMIN (MULTIVITAMIN WITH MINERALS) TABS TABLET    Take 1 tablet by mouth daily.   NAPROXEN SODIUM (ALEVE) 220 MG TABLET    Take 220 mg by mouth as needed.   OMEGA-3 FATTY ACIDS (FISH OIL) 1000 MG CAPS    Take by mouth.  Modified Medications   No medications on file  Discontinued Medications   No medications on file    Subjective: I asked Juan Lin to come back in for a visit today after his recent sputum culture returned positive for Mycobacterium avium again.  After his recent visit with me his hacking cough got worse.  He saw his PCP on 10/29/2021 and was started on amoxicillin/clavulanate, and an albuterol inhaler and Tessalon Per[place].  He is not  feeling much better yet.  He keeps stressing that he thinks his symptoms are due to high pollen counts but his wife, Juan Lin, repeatedly points out that he is also much more fatigued than normal and that he has had decreasing stamina over the last several months.  Review of Systems: Review of Systems  Constitutional:  Positive for malaise/fatigue. Negative for fever and weight loss.  Respiratory:  Positive for cough and sputum production. Negative for hemoptysis, shortness of breath and wheezing.   Cardiovascular:  Negative for chest pain.   Past Medical History:  Diagnosis Date   Adenoma 2008   serrated adenoma of colon   Diverticulosis    GERD (gastroesophageal reflux disease)    H. Pylori related-gone when treated   Hyperlipidemia    Prostate cancer [facility]) 1994   radical prostatectomy Dr. Gentry Fitz   Right inguinal hernia    Seasonal allergies    Sinusitis acute     Social History   Tobacco Use   Smoking status: Never   Smokeless tobacco: Never  Vaping Use   Vaping Use: Never used  Substance Use Topics   Alcohol use: Yes    Alcohol/week: 7.0 standard drinks    Types: 7 Standard drinks or equivalent per week  Comment: glass wine with dinner   Drug use: Yes    Types: Amyl nitrate, IV    Family History  Problem Relation Age of Onset   Prostate cancer Paternal Uncle    Prostate cancer Cousin    Pneumonia Father        aspiration   Other Other 68       smoker. sudden death unclear cause   Colon cancer Neg Hx     No Known Allergies  Objective: Vitals:   11/04/21 1527  BP: 120/84  Pulse: 87  Temp: 97.9 F (36.6 C)  TempSrc: Temporal  SpO2: 97%  Weight: 155 lb (70.3 kg)   Body mass index is 18.87 kg/m.  Physical Exam Constitutional:      Comments: He frequently coughs and clears his throat during the exam.  He definitely downplays his recent symptoms.  Cardiovascular:     Rate and Rhythm: Normal rate.  Pulmonary:     Effort: Pulmonary effort is  normal.  Psychiatric:        Mood and Affect: Mood normal.    Lab Results    Problem List Items Addressed This Visit       High   Mycobacterium avium infection ([place])    I told him that it is certainly possible that symptomatic, smoldering Mycobacterium avium pneumonia is contributing to his recent decline in his stamina and increased cough.  I told him that there could also be multiple factors including seasonal allergies and a superimposed upper respiratory infection following his recent trip to [place].  Given that he has just recently been started on amoxicillin/clavulanate I think it is best that we hold off on starting therapy for Mycobacterium avium.  Complete his current treatments and follow-up with me on 11/30/2021.         Michel Bickers, MD [facility] for [facility] [phone] pager   [phone] cell 11/04/2021, 4:07 PM

## 2021-11-04 NOTE — Assessment & Plan Note (Signed)
I told him that it is certainly possible that symptomatic, smoldering Mycobacterium avium pneumonia is contributing to his recent decline in his stamina and increased cough.  I told him that there could also be multiple factors including seasonal allergies and a superimposed upper respiratory infection following his recent trip to [place].  Given that he has just recently been started on amoxicillin/clavulanate I think it is best that we hold off on starting therapy for Mycobacterium avium.  Complete his current treatments and follow-up with me on 11/30/2021. ?

## 2021-11-06 ENCOUNTER — Encounter: Payer: Self-pay | Admitting: Family Medicine

## 2021-11-08 ENCOUNTER — Other Ambulatory Visit (HOSPITAL_COMMUNITY): Payer: Self-pay

## 2021-11-08 ENCOUNTER — Other Ambulatory Visit: Payer: Self-pay

## 2021-11-08 DIAGNOSIS — R059 Cough, unspecified: Secondary | ICD-10-CM

## 2021-11-08 MED ORDER — BENZONATATE 200 MG PO CAPS
200.0000 mg | ORAL_CAPSULE | Freq: Two times a day (BID) | ORAL | 0 refills | Status: DC | PRN
  Filled 2021-11-08: qty 20, 10d supply, fill #0

## 2021-11-08 NOTE — Telephone Encounter (Signed)
Rx refill sent to pharmacy. 

## 2021-11-08 NOTE — Telephone Encounter (Signed)
See note and advise °

## 2021-11-08 NOTE — Telephone Encounter (Signed)
Ok to refill tessalon. ? ?Algis Greenhouse. Jerline Pain, MD ?11/08/2021 3:56 PM  ? ?

## 2021-11-15 ENCOUNTER — Encounter: Payer: Self-pay | Admitting: Family Medicine

## 2021-11-15 DIAGNOSIS — Z85828 Personal history of other malignant neoplasm of skin: Secondary | ICD-10-CM | POA: Diagnosis not present

## 2021-11-15 DIAGNOSIS — L821 Other seborrheic keratosis: Secondary | ICD-10-CM | POA: Diagnosis not present

## 2021-11-15 DIAGNOSIS — L918 Other hypertrophic disorders of the skin: Secondary | ICD-10-CM | POA: Diagnosis not present

## 2021-11-15 DIAGNOSIS — D225 Melanocytic nevi of trunk: Secondary | ICD-10-CM | POA: Diagnosis not present

## 2021-11-15 DIAGNOSIS — D485 Neoplasm of uncertain behavior of skin: Secondary | ICD-10-CM | POA: Diagnosis not present

## 2021-11-15 DIAGNOSIS — D045 Carcinoma in situ of skin of trunk: Secondary | ICD-10-CM | POA: Diagnosis not present

## 2021-11-15 DIAGNOSIS — C4442 Squamous cell carcinoma of skin of scalp and neck: Secondary | ICD-10-CM | POA: Diagnosis not present

## 2021-11-15 DIAGNOSIS — L570 Actinic keratosis: Secondary | ICD-10-CM | POA: Diagnosis not present

## 2021-11-30 ENCOUNTER — Encounter: Payer: Self-pay | Admitting: Internal Medicine

## 2021-11-30 ENCOUNTER — Ambulatory Visit
Admission: RE | Admit: 2021-11-30 | Discharge: 2021-11-30 | Disposition: A | Discharge status: Home / Self Care | Payer: PPO | Source: Ambulatory Visit | Attending: Internal Medicine | Admitting: Internal Medicine

## 2021-11-30 ENCOUNTER — Other Ambulatory Visit (HOSPITAL_COMMUNITY): Payer: Self-pay

## 2021-11-30 ENCOUNTER — Ambulatory Visit: Payer: PPO | Admitting: Internal Medicine

## 2021-11-30 ENCOUNTER — Telehealth: Payer: Self-pay | Admitting: Pharmacist

## 2021-11-30 ENCOUNTER — Other Ambulatory Visit: Payer: Self-pay

## 2021-11-30 DIAGNOSIS — A310 Pulmonary mycobacterial infection: Secondary | ICD-10-CM

## 2021-11-30 DIAGNOSIS — Z8701 Personal history of pneumonia (recurrent): Secondary | ICD-10-CM | POA: Diagnosis not present

## 2021-11-30 DIAGNOSIS — R918 Other nonspecific abnormal finding of lung field: Secondary | ICD-10-CM | POA: Diagnosis not present

## 2021-11-30 DIAGNOSIS — J984 Other disorders of lung: Secondary | ICD-10-CM | POA: Diagnosis not present

## 2021-11-30 MED ORDER — ETHAMBUTOL HCL 400 MG PO TABS
1800.0000 mg | ORAL_TABLET | ORAL | 2 refills | Status: DC
  Filled 2021-11-30: qty 54, 28d supply, fill #0
  Filled 2021-12-29: qty 54, 28d supply, fill #1
  Filled 2022-01-21: qty 42, 21d supply, fill #2
  Filled 2022-01-21: qty 12, 7d supply, fill #2

## 2021-11-30 MED ORDER — RIFAMPIN 300 MG PO CAPS
600.0000 mg | ORAL_CAPSULE | ORAL | 2 refills | Status: DC
  Filled 2021-11-30: qty 24, 28d supply, fill #0
  Filled 2021-12-29: qty 24, 28d supply, fill #1
  Filled 2022-01-21: qty 24, 28d supply, fill #2

## 2021-11-30 MED ORDER — AZITHROMYCIN 500 MG PO TABS
500.0000 mg | ORAL_TABLET | ORAL | 2 refills | Status: DC
  Filled 2021-11-30: qty 12, 28d supply, fill #0
  Filled 2021-12-29: qty 12, 28d supply, fill #1
  Filled 2022-01-21: qty 12, 28d supply, fill #2

## 2021-11-30 NOTE — Progress Notes (Signed)
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   ETHAMBUTOL (MYAMBUTOL) 400 MG TABLET    Take 4.5 tablets (1,800 mg total) by mouth 3 (three) times a week.   RIFAMPIN (RIFADIN) 300 MG CAPSULE    Take 2 capsules (600 mg total) by mouth 3 (three) times a week.  Previous Medications   ALBUTEROL (VENTOLIN HFA) 108 (90 BASE) MCG/ACT INHALER    Inhale 2 puffs into the lungs every 6 (six) hours as needed for wheezing or shortness of breath.   AMOXICILLIN-CLAVULANATE (AUGMENTIN) 875-125 MG TABLET    Take 1 tablet by mouth 2 (two) times daily.   BENZONATATE (TESSALON) 200 MG CAPSULE    Take 1 capsule (200 mg total) by mouth 2 (two) times daily as needed for cough.   CETIRIZINE (ZYRTEC) 10 MG TABLET    Take 10 mg by mouth daily.   DOCUSATE SODIUM (COLACE) 100 MG CAPSULE    Take 100 mg by mouth daily.   GUAIFENESIN (MUCINEX) 600 MG 12 HR TABLET    Take by mouth 2 (two) times daily as needed.   MELATONIN PO    Take by mouth as needed.   MULTIPLE VITAMIN (MULTIVITAMIN WITH MINERALS) TABS TABLET    Take 1 tablet by mouth daily.   NAPROXEN SODIUM (ALEVE) 220 MG TABLET    Take 220 mg by mouth as needed.   OMEGA-3 FATTY ACIDS (FISH OIL) 1000 MG CAPS    Take by mouth.  Modified Medications   No medications on file  Discontinued Medications   No medications on file    Subjective: Juan Lin is in with his wife,  Juan Lin.  He continues to struggle with a very frequent cough occasionally productive of white sputum.  He is feeling more short of breath with decreased stamina.  He has lost about 5 pounds over the last month.  Review of Systems: Review of Systems  Constitutional:  Positive for malaise/fatigue and weight loss. Negative for chills, diaphoresis and fever.  Respiratory:  Positive for cough, sputum production and shortness of breath. Negative for hemoptysis and wheezing.   Cardiovascular:  Negative for chest pain.   Past Medical History:  Diagnosis Date   Adenoma 2008   serrated adenoma of colon   Diverticulosis    GERD (gastroesophageal reflux disease)    H. Pylori related-gone when treated   Hyperlipidemia    Prostate cancer [facility]) 1994   radical prostatectomy Dr. Gentry Fitz   Right inguinal hernia    Seasonal allergies    Sinusitis acute     Social History   Tobacco Use   Smoking status: Never   Smokeless tobacco: Never  Vaping Use   Vaping Use: Never used  Substance Use Topics   Alcohol use: Yes    Alcohol/week: 7.0 standard drinks    Types: 7 Standard  drinks or equivalent per week    Comment: glass wine with dinner   Drug use: Yes    Types: Amyl nitrate, IV    Family History  Problem Relation Age of Onset   Prostate cancer Paternal Uncle    Prostate cancer Cousin    Pneumonia Father        aspiration   Other Other 16       smoker. sudden death unclear cause   Colon cancer Neg Hx     No Known Allergies  Objective: Vitals:   11/30/21 1355  BP: 130/83  Pulse: 61  Temp: 97.7 F (36.5 C)  TempSrc: Oral  SpO2: 92%  Weight: 152 lb 6.4 oz (69.1 kg)   Body mass index is 18.55 kg/m.  Physical Exam Constitutional:      Comments: He is pleasant and talkative.  He has lots of questions about potential side effects of antibiotic therapy for Mycobacterium avium.  He takes copious notes throughout the exam.  Cardiovascular:     Rate and Rhythm: Normal rate.   Pulmonary:     Effort: Pulmonary effort is normal.     Breath sounds: Normal breath sounds.  Psychiatric:        Mood and Affect: Mood normal.    Lab Results    Problem List Items Addressed This Visit       High   Mycobacterium avium infection ([place])    I suspect that pizza smoldering illness is all due to slowly progressive Mycobacterium avium pneumonia.  I frequently have to tell him that we do not need to implicate any other illness to explain his symptoms and recent CT scan findings.  He is very focused on potential side effects of antibiotic therapy but I asked him to also consider the risk of not treating his infection.  He agrees to start 3 times weekly azithromycin, ethambutol and rifampin.  I went over how to take his antibiotics and potential side effects as did Daisy Lazar.  I will order a baseline chest x-ray today and have him collect a another sputum sample for AFB smear and culture.  I will arrange phone follow-up in 1 week.         Relevant Medications   rifampin (RIFADIN) 300 MG capsule (Start on 12/01/2021)   ethambutol (MYAMBUTOL) 400 MG tablet (Start on 12/01/2021)   azithromycin (ZITHROMAX) 500 MG tablet (Start on 12/01/2021)   Other Relevant Orders   MYCOBACTERIA, CULTURE, WITH FLUOROCHROME SMEAR   DG Chest 2 View     Michel Bickers, MD [facility] for Infectious [place] [phone] pager   [phone] cell 11/30/2021, 5:01 PM

## 2021-11-30 NOTE — Assessment & Plan Note (Addendum)
I suspect that pizza smoldering illness is all due to slowly progressive Mycobacterium avium pneumonia.  I frequently have to tell him that we do not need to implicate any other illness to explain his symptoms and recent CT scan findings.  He is very focused on potential side effects of antibiotic therapy but I asked him to also consider the risk of not treating his infection.  He agrees to start 3 times weekly azithromycin, ethambutol and rifampin.  I went over how to take his antibiotics and potential side effects as did Daisy Lazar.  I will order a baseline chest x-ray today and have him collect a another sputum sample for AFB smear and culture.  I will arrange phone follow-up in 1 week.

## 2021-11-30 NOTE — Telephone Encounter (Signed)
Dr. Megan Salon to start [facility] on [facility] treatment this week with three times weekly azithromycin, ethambutol, and rifampin. Prescriptions sent to [facility] today; patient will pick up tomorrow and likely start therapy on Thursday morning. He will follow-up over the phone with Dr. Megan Salon next week.  Rifampin: - Take 2 capsules (671m) first thing in the morning TIW - Must be taken on an empty stomach  - Take with a full glass of water - Will monitor liver function - Bodily fluids will be tinted orange  Ethambutol: - Take 4.5 tablets (1809m with or without food TIW - Will monitor liver function - Call clinic about any visual changes  Azithromycin: - Take 1 tablet (50022mwith or without food TIW - Call clinic about any nausea/vomiting  AmaAlfonse SpruceharmD, CPP Clinical Pharmacist Practitioner Infectious Diseases [facility]

## 2021-12-02 ENCOUNTER — Telehealth: Payer: Self-pay

## 2021-12-02 NOTE — Telephone Encounter (Signed)
Received call from Napier Field with [facility] regarding chest x ray results done on 6/6.  Will forward results to provider to review.  DG Chest 2 View   Narrative & Impression  CLINICAL DATA:  Mycobacterium avium pneumonia.   EXAM: CHEST - 2 VIEW   COMPARISON:  Chest x-ray 06/04/2007.   FINDINGS: There are scattered interstitial opacities throughout both mid and upper lungs new from prior examination. There is a new nodular density in the right mid lung measuring 6 mm. There is no pleural effusion or pneumothorax identified. Cardiomediastinal silhouette is within normal limits. External artifact overlies the posterior right upper back. No acute fractures are seen.   IMPRESSION: 1. Scattered bilateral interstitial opacities in the mid and upper lungs, likely infectious/inflammatory. 2. New 6 mm nodular density in the right mid lung, indeterminate. Follow-up chest CT recommended.     Electronically Signed   By: Ronney Asters M.D.   On: 12/01/2021 20:41

## 2021-12-07 ENCOUNTER — Other Ambulatory Visit: Payer: Self-pay

## 2021-12-07 ENCOUNTER — Ambulatory Visit (INDEPENDENT_AMBULATORY_CARE_PROVIDER_SITE_OTHER): Payer: PPO | Admitting: Internal Medicine

## 2021-12-07 ENCOUNTER — Encounter: Payer: Self-pay | Admitting: Internal Medicine

## 2021-12-07 DIAGNOSIS — A310 Pulmonary mycobacterial infection: Secondary | ICD-10-CM | POA: Diagnosis not present

## 2021-12-07 NOTE — Progress Notes (Signed)
Virtual Visit via Telephone Note  I connected with [name] on 12/07/21 at  1:45 PM EDT by telephone and verified that I am speaking with the correct person using two identifiers.  Location: Patient: Home Provider: [facility]   I discussed the limitations, risks, security and privacy concerns of performing an evaluation and management service by telephone and the availability of in person appointments. I also discussed with the patient that there may be a patient responsible charge related to this service. The patient expressed understanding and agreed to proceed.   History of Present Illness: I called and spoke with [facility] today.  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  He has already noted improvement in his cough.  He feels like he is tolerating his antibiotics well.   Observations/Objective: Chest x-ray 11/30/2021   IMPRESSION: 1. Scattered bilateral interstitial opacities in the mid and upper lungs, likely infectious/inflammatory. 2. New 6 mm nodular density in the right mid lung, indeterminate. Follow-up chest CT recommended.  Sputum AFB smear 11/30/2021 negative; culture is pending  Assessment and Plan: He is off to a good start with his 3 drug treatment regimen for Mycobacterium avium pneumonia.  Follow Up Instructions: Continue azithromycin, ethambutol and rifampin Follow-up here 01/13/2022   I discussed the assessment and treatment plan with the patient. The patient was provided an opportunity to ask questions and all were answered. The patient agreed with the plan and demonstrated an understanding of the instructions.   The patient was advised to call back or seek an in-person evaluation if the symptoms worsen or if the condition fails to improve as anticipated.  I provided 15 minutes of non-face-to-face time during this encounter.   Michel Bickers, MD

## 2021-12-22 LAB — MYCOBACTERIA,CULT W/FLUOROCHROME SMEAR
MICRO NUMBER:: 13318722
SMEAR:: NONE SEEN
SPECIMEN QUALITY:: ADEQUATE

## 2021-12-22 LAB — M. AVIUM MIC PANEL
AMIKACIN (LIPOSOMAL, INHALED): 32 ug/mL
AMIKACIN: 32 ug/mL
CIPROFLOXACIN: >8 ug/mL
CLARITHROMYCIN: 4 ug/mL
CLOFAZIMINE: 0.25 ug/mL
DOXYCYCLINE: >8 ug/mL
LINEZOLID: 32 ug/mL
MINOCYCLINE: >8 ug/mL
MOXIFLOXACIN: 4 ug/mL
RIFABUTIN: 0.5 ug/mL
RIFAMPIN: 4 ug/mL
STREPTOMYCIN: >32 ug/mL

## 2021-12-27 DIAGNOSIS — R9721 Rising PSA following treatment for malignant neoplasm of prostate: Secondary | ICD-10-CM | POA: Diagnosis not present

## 2021-12-29 ENCOUNTER — Other Ambulatory Visit (HOSPITAL_COMMUNITY): Payer: Self-pay

## 2021-12-30 DIAGNOSIS — C61 Malignant neoplasm of prostate: Secondary | ICD-10-CM | POA: Diagnosis not present

## 2022-01-13 ENCOUNTER — Ambulatory Visit: Payer: PPO | Admitting: Internal Medicine

## 2022-01-13 ENCOUNTER — Other Ambulatory Visit: Payer: Self-pay

## 2022-01-13 ENCOUNTER — Encounter: Payer: Self-pay | Admitting: Internal Medicine

## 2022-01-13 DIAGNOSIS — A310 Pulmonary mycobacterial infection: Secondary | ICD-10-CM

## 2022-01-13 NOTE — Assessment & Plan Note (Signed)
[name] is off to an excellent start with therapy for Mycobacterium avium pneumonia.  He is tolerating his treatment well and has had prompt clinical improvement.  Interestingly enough his sputum culture obtained before starting therapy remains negative at nearly 6 weeks.  He will continue his 3 drug antibiotic regimen, update blood work today and submit another sputum specimen for culture.  He will follow-up in 4 weeks.  I talked to him again about potential side effects of his medications.

## 2022-01-13 NOTE — Progress Notes (Signed)
[facility] for Infectious Disease  Patient Active Problem List   Diagnosis Date Noted   Mycobacterium avium infection ([place]) 08/18/2021    Priority: High   H/O radical prostatectomy 08/18/2021   Varicose vein of leg 12/08/2017   Hearing loss 12/08/2017   Hemorrhagic prepatellar bursitis of left knee 09/16/2015   Elevated TSH 06/02/2014   History of colon polyps    Basal cell cancer 04/08/2013   Prostate cancer ([place])    Hyperlipidemia 03/10/2009   Allergic rhinitis 08/06/2007    Patient's Medications  New Prescriptions   No medications on file  Previous Medications   ALBUTEROL (VENTOLIN HFA) 108 (90 BASE) MCG/ACT INHALER    Inhale 2 puffs into the lungs every 6 (six) hours as needed for wheezing or shortness of breath.   AMOXICILLIN-CLAVULANATE (AUGMENTIN) 875-125 MG TABLET    Take 1 tablet by mouth 2 (two) times daily.   AZITHROMYCIN (ZITHROMAX) 500 MG TABLET    Take 1 tablet (500 mg total) by mouth 3 (three) times a week.   BENZONATATE (TESSALON) 200 MG CAPSULE    Take 1 capsule (200 mg total) by mouth 2 (two) times daily as needed for cough.   CETIRIZINE (ZYRTEC) 10 MG TABLET    Take 10 mg by mouth daily.   DOCUSATE SODIUM (COLACE) 100 MG CAPSULE    Take 100 mg by mouth daily.   ETHAMBUTOL (MYAMBUTOL) 400 MG TABLET    Take 4.5 tablets (1,800 mg total) by mouth 3 (three) times a week.   GUAIFENESIN (MUCINEX) 600 MG 12 HR TABLET    Take by mouth 2 (two) times daily as needed.   MELATONIN PO    Take by mouth as needed.   MULTIPLE VITAMIN (MULTIVITAMIN WITH MINERALS) TABS TABLET    Take 1 tablet by mouth daily.   NAPROXEN SODIUM (ALEVE) 220 MG TABLET    Take 220 mg by mouth as needed.   OMEGA-3 FATTY ACIDS (FISH OIL) 1000 MG CAPS    Take by mouth.   RIFAMPIN (RIFADIN) 300 MG CAPSULE    Take 2 capsules (600 mg total) by mouth 3 (three) times a week.  Modified Medications   No medications on file  Discontinued Medications   No medications on file     Subjective: Patient is in with his wife, [name], for his routine follow-up visit.  He started on 3 times weekly azithromycin, ethambutol and rifampin on 11/30/2021.  He has had no problems tolerating his antibiotics.  His coughing spasms improved fairly promptly.  They just returned from a 4-day vacation at [facility]  [name] said that his stamina is markedly improved.  Review of Systems: Review of Systems  Constitutional:        He is in his usual jovial mood.  Respiratory:  Positive for cough and sputum production. Negative for hemoptysis, shortness of breath and wheezing.   Gastrointestinal:  Negative for abdominal pain, diarrhea, nausea and vomiting.    Past Medical History:  Diagnosis Date   Adenoma 2008   serrated adenoma of colon   Diverticulosis    GERD (gastroesophageal reflux disease)    H. Pylori related-gone when treated   Hyperlipidemia    Prostate cancer [facility]) 1994   radical prostatectomy Dr. Gentry Fitz   Right inguinal hernia    Seasonal allergies    Sinusitis acute     Social History   Tobacco Use   Smoking status: Never   Smokeless tobacco: Never  Vaping Use  Vaping Use: Never used  Substance Use Topics   Alcohol use: Yes    Alcohol/week: 7.0 standard drinks of alcohol    Types: 7 Standard drinks or equivalent per week    Comment: glass wine with dinner   Drug use: Yes    Types: Amyl nitrate, IV    Family History  Problem Relation Age of Onset   Prostate cancer Paternal Uncle    Prostate cancer Cousin    Pneumonia Father        aspiration   Other Other 36       smoker. sudden death unclear cause   Colon cancer Neg Hx     No Known Allergies  Objective: Vitals:   01/13/22 1053  BP: (!) 161/99  Pulse: 81  Resp: 16  SpO2: 96%  Weight: 156 lb (70.8 kg)  Height: '6\' 3"'$  (1.905 m)   Body mass index is 19.5 kg/m.  Physical Exam Constitutional:      Comments: His weight is up 3-1/2 pounds.  Cardiovascular:     Rate and Rhythm: Normal  rate and regular rhythm.     Heart sounds: No murmur heard. Pulmonary:     Effort: Pulmonary effort is normal.     Breath sounds: Normal breath sounds.  Psychiatric:        Mood and Affect: Mood normal.     Lab Results    Problem List Items Addressed This Visit       High   Mycobacterium avium infection ([place])    [name] is off to an excellent start with therapy for Mycobacterium avium pneumonia.  He is tolerating his treatment well and has had prompt clinical improvement.  Interestingly enough his sputum culture obtained before starting therapy remains negative at nearly 6 weeks.  He will continue his 3 drug antibiotic regimen, update blood work today and submit another sputum specimen for culture.  He will follow-up in 4 weeks.  I talked to him again about potential side effects of his medications.      Relevant Orders   CBC   Comprehensive metabolic panel   MYCOBACTERIA, CULTURE, WITH FLUOROCHROME SMEAR     Michel Bickers, MD [facility] for [facility] [phone] pager   [phone] cell 01/13/2022, 11:20 AM

## 2022-01-14 LAB — COMPREHENSIVE METABOLIC PANEL
AG Ratio: 1.4 (calc) (ref 1.0–2.5)
ALT: 15 U/L (ref 9–46)
AST: 21 U/L (ref 10–35)
Albumin: 4.1 g/dL (ref 3.6–5.1)
Alkaline phosphatase (APISO): 59 U/L (ref 35–144)
BUN: 17 mg/dL (ref 7–25)
CO2: 24 mmol/L (ref 20–32)
Calcium: 8.9 mg/dL (ref 8.6–10.3)
Chloride: 101 mmol/L (ref 98–110)
Creat: 1.04 mg/dL (ref 0.70–1.22)
Globulin: 3 g/dL (calc) (ref 1.9–3.7)
Glucose, Bld: 102 mg/dL — ABNORMAL HIGH (ref 65–99)
Potassium: 4.4 mmol/L (ref 3.5–5.3)
Sodium: 135 mmol/L (ref 135–146)
Total Bilirubin: 0.5 mg/dL (ref 0.2–1.2)
Total Protein: 7.1 g/dL (ref 6.1–8.1)

## 2022-01-14 LAB — MYCOBACTERIA,CULT W/FLUOROCHROME SMEAR
MICRO NUMBER:: 13493821
SMEAR:: NONE SEEN
SPECIMEN QUALITY:: ADEQUATE

## 2022-01-14 LAB — CBC
HCT: 43.5 % (ref 38.5–50.0)
Hemoglobin: 14.9 g/dL (ref 13.2–17.1)
MCH: 30.3 pg (ref 27.0–33.0)
MCHC: 34.3 g/dL (ref 32.0–36.0)
MCV: 88.6 fL (ref 80.0–100.0)
MPV: 9.1 fL (ref 7.5–12.5)
Platelets: 205 10*3/uL (ref 140–400)
RBC: 4.91 10*6/uL (ref 4.20–5.80)
RDW: 13 % (ref 11.0–15.0)
WBC: 6.3 10*3/uL (ref 3.8–10.8)

## 2022-01-21 ENCOUNTER — Other Ambulatory Visit (HOSPITAL_COMMUNITY): Payer: Self-pay

## 2022-02-15 ENCOUNTER — Encounter: Payer: Self-pay | Admitting: Internal Medicine

## 2022-02-15 ENCOUNTER — Other Ambulatory Visit: Payer: Self-pay

## 2022-02-15 ENCOUNTER — Ambulatory Visit: Payer: PPO | Admitting: Internal Medicine

## 2022-02-15 ENCOUNTER — Other Ambulatory Visit (HOSPITAL_COMMUNITY): Payer: Self-pay

## 2022-02-15 DIAGNOSIS — A310 Pulmonary mycobacterial infection: Secondary | ICD-10-CM

## 2022-02-15 MED ORDER — RIFAMPIN 300 MG PO CAPS
600.0000 mg | ORAL_CAPSULE | ORAL | 3 refills | Status: DC
  Filled 2022-02-15: qty 72, 84d supply, fill #0
  Filled 2022-05-10: qty 72, 84d supply, fill #1
  Filled 2022-08-08: qty 72, 84d supply, fill #2

## 2022-02-15 MED ORDER — ETHAMBUTOL HCL 400 MG PO TABS
1800.0000 mg | ORAL_TABLET | ORAL | 3 refills | Status: DC
  Filled 2022-02-15: qty 162, 84d supply, fill #0
  Filled 2022-05-10: qty 162, 84d supply, fill #1
  Filled 2022-08-08: qty 162, 84d supply, fill #2

## 2022-02-15 MED ORDER — AZITHROMYCIN 500 MG PO TABS
500.0000 mg | ORAL_TABLET | ORAL | 3 refills | Status: DC
  Filled 2022-02-15: qty 36, 84d supply, fill #0
  Filled 2022-05-10: qty 36, 84d supply, fill #1
  Filled 2022-08-08: qty 36, 84d supply, fill #2

## 2022-02-15 NOTE — Assessment & Plan Note (Signed)
He has had an excellent response to treatment for Mycobacterium AVM.  He will continue his current 3 drug regimen and follow-up in 3 months.

## 2022-02-15 NOTE — Addendum Note (Signed)
Addended by: Michel Bickers on: 02/15/2022 02:31 PM   Modules accepted: Orders

## 2022-02-15 NOTE — Progress Notes (Signed)
[facility] for Infectious Disease  Patient Active Problem List   Diagnosis Date Noted   Mycobacterium avium infection ([place]) 08/18/2021    Priority: High   H/O radical prostatectomy 08/18/2021   Varicose vein of leg 12/08/2017   Hearing loss 12/08/2017   Hemorrhagic prepatellar bursitis of left knee 09/16/2015   Elevated TSH 06/02/2014   History of colon polyps    Basal cell cancer 04/08/2013   Prostate cancer ([place])    Hyperlipidemia 03/10/2009   Allergic rhinitis 08/06/2007    Patient's Medications  New Prescriptions   No medications on file  Previous Medications   ALBUTEROL (VENTOLIN HFA) 108 (90 BASE) MCG/ACT INHALER    Inhale 2 puffs into the lungs every 6 (six) hours as needed for wheezing or shortness of breath.   AZITHROMYCIN (ZITHROMAX) 500 MG TABLET    Take 1 tablet (500 mg total) by mouth 3 (three) times a week.   BENZONATATE (TESSALON) 200 MG CAPSULE    Take 1 capsule (200 mg total) by mouth 2 (two) times daily as needed for cough.   CETIRIZINE (ZYRTEC) 10 MG TABLET    Take 10 mg by mouth daily.   DOCUSATE SODIUM (COLACE) 100 MG CAPSULE    Take 100 mg by mouth daily.   ETHAMBUTOL (MYAMBUTOL) 400 MG TABLET    Take 4.5 tablets (1,800 mg total) by mouth 3 (three) times a week.   GUAIFENESIN (MUCINEX) 600 MG 12 HR TABLET    Take by mouth 2 (two) times daily as needed.   MELATONIN PO    Take by mouth as needed.   MULTIPLE VITAMIN (MULTIVITAMIN WITH MINERALS) TABS TABLET    Take 1 tablet by mouth daily.   NAPROXEN SODIUM (ALEVE) 220 MG TABLET    Take 220 mg by mouth as needed.   OMEGA-3 FATTY ACIDS (FISH OIL) 1000 MG CAPS    Take by mouth.   RIFAMPIN (RIFADIN) 300 MG CAPSULE    Take 2 capsules (600 mg total) by mouth 3 (three) times a week.  Modified Medications   No medications on file  Discontinued Medications   AMOXICILLIN-CLAVULANATE (AUGMENTIN) 875-125 MG TABLET    Take 1 tablet by mouth 2 (two) times daily.    Subjective: Patient is in with his  wife, [name], for his routine follow-up visit.  He started on 3 times weekly azithromycin, ethambutol and rifampin on 11/30/2021.  He has had no problems tolerating his antibiotics.  His coughing spasms improved fairly promptly.  They just returned from a trip to [place] where they participated in a week long dance class.  [name] said that his stamina is markedly improved and he is not sleeping as much.  Sputum AFB culture done in early June was negative even before starting therapy.  AFB smear and culture obtained in mid July remain negative.  Review of Systems: Review of Systems  Constitutional:        He is in his usual jovial mood.  Respiratory:  Positive for cough and sputum production. Negative for hemoptysis, shortness of breath and wheezing.   Gastrointestinal:  Negative for abdominal pain, diarrhea, nausea and vomiting.    Past Medical History:  Diagnosis Date   Adenoma 2008   serrated adenoma of colon   Diverticulosis    GERD (gastroesophageal reflux disease)    H. Pylori related-gone when treated   Hyperlipidemia    Prostate cancer [facility]) 1994   radical prostatectomy Dr. Gentry Fitz   Right  inguinal hernia    Seasonal allergies    Sinusitis acute     Social History   Tobacco Use   Smoking status: Never   Smokeless tobacco: Never  Vaping Use   Vaping Use: Never used  Substance Use Topics   Alcohol use: Yes    Alcohol/week: 7.0 standard drinks of alcohol    Types: 7 Standard drinks or equivalent per week    Comment: glass wine with dinner   Drug use: Yes    Types: Amyl nitrate, IV    Family History  Problem Relation Age of Onset   Prostate cancer Paternal Uncle    Prostate cancer Cousin    Pneumonia Father        aspiration   Other Other 30       smoker. sudden death unclear cause   Colon cancer Neg Hx     No Known Allergies  Objective: Vitals:   02/15/22 1409  BP: 117/83  Pulse: (!) 101  Temp: 97.9 F (36.6 C)  TempSrc: Temporal  SpO2: 97%  Weight:  149 lb (67.6 kg)  Height: '6\' 1"'$  (1.854 m)   Body mass index is 19.66 kg/m.  Physical Exam Constitutional:      Comments: He is in good spirits.  He is accompanied by his wife, [name].  Cardiovascular:     Rate and Rhythm: Normal rate and regular rhythm.     Heart sounds: No murmur heard. Pulmonary:     Effort: Pulmonary effort is normal.     Breath sounds: Normal breath sounds.  Psychiatric:        Mood and Affect: Mood normal.     Lab Results    Problem List Items Addressed This Visit       High   Mycobacterium avium infection ([place])    He has had an excellent response to treatment for Mycobacterium AVM.  He will continue his current 3 drug regimen and follow-up in 3 months.       Michel Bickers, MD [facility] for [facility] [place] [facility] [phone] pager   [phone] cell 02/15/2022, 2:28 PM

## 2022-02-16 ENCOUNTER — Other Ambulatory Visit (HOSPITAL_COMMUNITY): Payer: Self-pay

## 2022-02-24 ENCOUNTER — Encounter: Payer: Self-pay | Admitting: Pulmonary Disease

## 2022-02-24 ENCOUNTER — Ambulatory Visit: Payer: PPO | Admitting: Pulmonary Disease

## 2022-02-24 VITALS — BP 130/80 | HR 76 | Ht 72.5 in | Wt 155.4 lb

## 2022-02-24 DIAGNOSIS — R918 Other nonspecific abnormal finding of lung field: Secondary | ICD-10-CM | POA: Diagnosis not present

## 2022-02-24 DIAGNOSIS — J479 Bronchiectasis, uncomplicated: Secondary | ICD-10-CM

## 2022-02-24 DIAGNOSIS — A310 Pulmonary mycobacterial infection: Secondary | ICD-10-CM

## 2022-02-24 NOTE — Progress Notes (Signed)
Synopsis: Referred in January 2023 for abnormal PET scan by Marin Olp, MD  Subjective:   PATIENT ID: Juan Lin GENDER: male DOB: July 09, 1940, MRN: [id number]  Chief Complaint  Patient presents with   Follow-up    [name], past medical history of prostate cancer, status post radical prostatectomy, 1999.  Patient is a non-smoker.Patient recently had a PSA specific PET scan completed by urology.  This revealed residual area of carcinoma in the posterior aspect of the prostate gland.  There was progressive nodularity in the right and left lung nodules clustered together with recommendations was consideration of tissue sampling on the PET scan felt to be more inflammatory in nature.  Patient has daily cough and sputum production.  Occasionally has trouble clearing his throat.  Lifelong non-smoker.  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  Is multiple pulmonary nodules with associated varicoid cylindric bronchiectasis is consistent with pulmonary mycobacterial disease.  We talked about this today in the office and reviewed his recent CT imaging.  He still has ongoing cough and sputum production.  OV 02/24/2022: Patient being treated by infectious disease for Mycobacterium avium complex.  He has been doing really well.  He feels much better.  Less cough less sputum production.  He has had negative sputum cultures from infectious disease.  He has follow-up plans already scheduled with them.  No additional respiratory symptoms at this time.    Past Medical History:  Diagnosis Date   Adenoma 2008   serrated adenoma of colon   Diverticulosis    GERD (gastroesophageal reflux disease)    H. Pylori related-gone when treated   Hyperlipidemia    Prostate cancer [facility]) 1994   radical prostatectomy Dr. Gentry Fitz   Right inguinal hernia    Seasonal allergies    Sinusitis acute      Family History  Problem Relation Age of Onset    Prostate cancer Paternal Uncle    Prostate cancer Cousin    Pneumonia Father        aspiration   Other Other 38       smoker. sudden death unclear cause   Colon cancer Neg Hx      Past Surgical History:  Procedure Laterality Date   APPENDECTOMY     CATARACT EXTRACTION, BILATERAL Bilateral 2018   CHOLECYSTECTOMY  05/2009   INGUINAL HERNIA REPAIR Right 04/13/2020   Procedure: OPEN RIGHT INGUINAL HERNIA REPAIR;  Surgeon: Dwan Bolt, MD;  Location: [facility];  Service: General;  Laterality: Right;   prostatectomy  1994   DR.MOHLER [facility]   TONSILLECTOMY      Social History   Socioeconomic History   Marital status: Married    Spouse name: Not on file   Number of children: Not on file   Years of education: Not on file   Highest education level: Not on file  Occupational History    Comment: currently volunteers as musician  Tobacco Use   Smoking status: Never   Smokeless tobacco: Never  Vaping Use   Vaping Use: Never used  Substance and Sexual Activity   Alcohol use: Yes    Alcohol/week: 7.0 standard drinks of alcohol    Types: 7 Standard drinks or equivalent per week    Comment: glass wine with dinner   Drug use: Yes    Types: Amyl nitrate, IV   Sexual activity: Not on file  Other Topics Concern   Not on file  Social  History Narrative   From [place]    Came down to [employer] for graduate studies- Phd in botany.    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       Married 1985.1st marriage. No kids. No pets.       Hobbies- above + gardening      Social Determinants of Health   Financial Resource Strain: Low Risk  (06/10/2021)   Overall Financial Resource Strain (CARDIA)    Difficulty of Paying Living Expenses: Not hard at all  Food Insecurity: No Food Insecurity (06/10/2021)   Hunger Vital Sign    Worried About Running  Out of Food in the Last Year: Never true    Ran Out of Food in the Last Year: Never true  Transportation Needs: No Transportation Needs (06/10/2021)   PRAPARE - Hydrologist (Medical): No    Lack of Transportation (Non-Medical): No  Physical Activity: Sufficiently Active (06/10/2021)   Exercise Vital Sign    Days of Exercise per Week: 4 days    Minutes of Exercise per Session: 60 min  Stress: No Stress Concern Present (06/10/2021)   [place]    Feeling of Stress : Not at all  Social Connections: Moderately Integrated (06/10/2021)   Social Connection and Isolation Panel [NHANES]    Frequency of Communication with Friends and Family: Three times a week    Frequency of Social Gatherings with Friends and Family: Three times a week    Attends Religious Services: Never    Active Member of Clubs or Organizations: Yes    Attends Archivist Meetings: 1 to 4 times per year    Marital Status: Married  Human resources officer Violence: Not At Risk (06/10/2021)   Humiliation, Afraid, Rape, and Kick questionnaire    Fear of Current or Ex-Partner: No    Emotionally Abused: No    Physically Abused: No    Sexually Abused: No     No Known Allergies   Outpatient Medications Prior to Visit  Medication Sig Dispense Refill   azithromycin (ZITHROMAX) 500 MG tablet Take 1 tablet (500 mg total) by mouth 3 (three) times a week. 36 tablet 3   cetirizine (ZYRTEC) 10 MG tablet Take 10 mg by mouth daily.     ethambutol (MYAMBUTOL) 400 MG tablet Take 4.5 tablets (1,800 mg total) by mouth 3 (three) times a week. 162 tablet 3   guaiFENesin (MUCINEX) 600 MG 12 hr tablet Take by mouth 2 (two) times daily as needed.     rifampin (RIFADIN) 300 MG capsule Take 2 capsules (600 mg total) by mouth 3 (three) times a week. 72 capsule 3   albuterol (VENTOLIN HFA) 108 (90 Base) MCG/ACT inhaler Inhale 2 puffs into the lungs  every 6 (six) hours as needed for wheezing or shortness of breath. (Patient not taking: Reported on 02/24/2022) 8.5 g 0   benzonatate (TESSALON) 200 MG capsule Take 1 capsule (200 mg total) by mouth 2 (two) times daily as needed for cough. (Patient not taking: Reported on 02/24/2022) 20 capsule 0   docusate sodium (COLACE) 100 MG capsule Take 100 mg by mouth daily. (Patient not taking: Reported on 02/24/2022)     MELATONIN PO Take by mouth as needed. (Patient not taking: Reported on 02/24/2022)     Multiple Vitamin (MULTIVITAMIN WITH  MINERALS) TABS tablet Take 1 tablet by mouth daily.     naproxen sodium (ALEVE) 220 MG tablet Take 220 mg by mouth as needed.     Omega-3 Fatty Acids (FISH OIL) 1000 MG CAPS Take by mouth.     No facility-administered medications prior to visit.    Review of Systems  Constitutional:  Negative for chills, fever, malaise/fatigue and weight loss.  HENT:  Negative for hearing loss, sore throat and tinnitus.   Eyes:  Negative for blurred vision and double vision.  Respiratory:  Positive for cough and sputum production. Negative for hemoptysis, shortness of breath, wheezing and stridor.   Cardiovascular:  Negative for chest pain, palpitations, orthopnea, leg swelling and PND.  Gastrointestinal:  Negative for abdominal pain, constipation, diarrhea, heartburn, nausea and vomiting.  Genitourinary:  Negative for dysuria, hematuria and urgency.  Musculoskeletal:  Negative for joint pain and myalgias.  Skin:  Negative for itching and rash.  Neurological:  Negative for dizziness, tingling, weakness and headaches.  Endo/Heme/Allergies:  Negative for environmental allergies. Does not bruise/bleed easily.  Psychiatric/Behavioral:  Negative for depression. The patient is not nervous/anxious and does not have insomnia.   All other systems reviewed and are negative.    Objective:  Physical Exam Vitals reviewed.  Constitutional:      General: He is not in acute distress.     Appearance: He is well-developed.     Comments: Thin, BMI 20  HENT:     Head: Normocephalic and atraumatic.  Eyes:     General: No scleral icterus.    Conjunctiva/sclera: Conjunctivae normal.     Pupils: Pupils are equal, round, and reactive to light.  Neck:     Vascular: No JVD.     Trachea: No tracheal deviation.  Cardiovascular:     Rate and Rhythm: Normal rate and regular rhythm.     Heart sounds: Normal heart sounds. No murmur heard. Pulmonary:     Effort: Pulmonary effort is normal. No tachypnea, accessory muscle usage or respiratory distress.     Breath sounds: No stridor. No wheezing, rhonchi or rales.     Comments: Diminished breath sounds bilaterally Abdominal:     General: There is no distension.     Palpations: Abdomen is soft.     Tenderness: There is no abdominal tenderness.  Musculoskeletal:        General: No tenderness.     Cervical back: Neck supple.  Lymphadenopathy:     Cervical: No cervical adenopathy.  Skin:    General: Skin is warm and dry.     Capillary Refill: Capillary refill takes less than 2 seconds.     Findings: No rash.  Neurological:     Mental Status: He is alert and oriented to person, place, and time.  Psychiatric:        Behavior: Behavior normal.      Vitals:   02/24/22 1355  BP: 130/80  Pulse: 76  SpO2: 96%  Weight: 155 lb 6.4 oz (70.5 kg)  Height: 6' 0.5" (1.842 m)   96% on RA BMI Readings from Last 3 Encounters:  02/24/22 20.79 kg/m  02/15/22 19.66 kg/m  01/13/22 19.50 kg/m   Wt Readings from Last 3 Encounters:  02/24/22 155 lb 6.4 oz (70.5 kg)  02/15/22 149 lb (67.6 kg)  01/13/22 156 lb (70.8 kg)     CBC    Component Value Date/Time   WBC 6.3 01/13/2022 1138   RBC 4.91 01/13/2022 1138   HGB 14.9 01/13/2022 1138  HCT 43.5 01/13/2022 1138   PLT 205 01/13/2022 1138   MCV 88.6 01/13/2022 1138   MCH 30.3 01/13/2022 1138   MCHC 34.3 01/13/2022 1138   RDW 13.0 01/13/2022 1138   LYMPHSABS 1.7 02/09/2021 1436    MONOABS 0.6 02/09/2021 1436   EOSABS 0.1 02/09/2021 1436   BASOSABS 0.0 02/09/2021 1436     Chest Imaging: November nuclear medicine pet imaging, PSA specific: Progressive nodularity within the peripheral portions of the right and left lung.  Have low-level metabolic uptake on this imaging. The patient's images have been independently reviewed by me.    Pulmonary Functions Testing Results:     No data to display          FeNO:   Pathology:   Echocardiogram:   Heart Catheterization:     Assessment & Plan:     ICD-10-CM   1. Mycobacterium avium complex ([place])  A31.0     2. Bronchiectasis without complication ([place])  V77.9     3. Multiple nodules of lung  R91.8        Discussion:  [name] with multiple pulmonary nodules, evidence of tree-in-bud opacities.  And cylindric bronchiectasis.  Sputum cultures diagnosed Mycobacterium avium complex.  Plan: Continue treatments with infectious disease. Continue flutter valve and airway clearance techniques. Patient doing really well from our standpoint. He is can continue to follow-up with infectious disease. I expect after treatment a repeat noncontrasted CT scan of the chest would be good to see if he has any other progression of disease. Patient to follow-up with [place] after that.    Current Outpatient Medications:    azithromycin (ZITHROMAX) 500 MG tablet, Take 1 tablet (500 mg total) by mouth 3 (three) times a week., Disp: 36 tablet, Rfl: 3   cetirizine (ZYRTEC) 10 MG tablet, Take 10 mg by mouth daily., Disp: , Rfl:    ethambutol (MYAMBUTOL) 400 MG tablet, Take 4.5 tablets (1,800 mg total) by mouth 3 (three) times a week., Disp: 162 tablet, Rfl: 3   guaiFENesin (MUCINEX) 600 MG 12 hr tablet, Take by mouth 2 (two) times daily as needed., Disp: , Rfl:    rifampin (RIFADIN) 300 MG capsule, Take 2 capsules (600 mg total) by mouth 3 (three) times a week., Disp: 72 capsule, Rfl: 3   albuterol  (VENTOLIN HFA) 108 (90 Base) MCG/ACT inhaler, Inhale 2 puffs into the lungs every 6 (six) hours as needed for wheezing or shortness of breath. (Patient not taking: Reported on 02/24/2022), Disp: 8.5 g, Rfl: 0   benzonatate (TESSALON) 200 MG capsule, Take 1 capsule (200 mg total) by mouth 2 (two) times daily as needed for cough. (Patient not taking: Reported on 02/24/2022), Disp: 20 capsule, Rfl: 0   docusate sodium (COLACE) 100 MG capsule, Take 100 mg by mouth daily. (Patient not taking: Reported on 02/24/2022), Disp: , Rfl:    MELATONIN PO, Take by mouth as needed. (Patient not taking: Reported on 02/24/2022), Disp: , Rfl:    Multiple Vitamin (MULTIVITAMIN WITH MINERALS) TABS tablet, Take 1 tablet by mouth daily., Disp: , Rfl:    naproxen sodium (ALEVE) 220 MG tablet, Take 220 mg by mouth as needed., Disp: , Rfl:    Omega-3 Fatty Acids (FISH OIL) 1000 MG CAPS, Take by mouth., Disp: , Rfl:    Garner Nash, DO [facility] 02/24/2022 2:14 PM

## 2022-02-24 NOTE — Patient Instructions (Signed)
Thank you for visiting Dr. Valeta Harms at [facility]. Today we recommend the following:  Continue follow-up with infectious disease.  Return if symptoms worsen or fail to improve.    Please do your part to reduce the spread of COVID-19.

## 2022-03-01 LAB — MYCOBACTERIA,CULT W/FLUOROCHROME SMEAR
MICRO NUMBER:: 13676451
SMEAR:: NONE SEEN
SPECIMEN QUALITY:: ADEQUATE

## 2022-03-21 ENCOUNTER — Encounter: Payer: Self-pay | Admitting: *Deleted

## 2022-03-25 ENCOUNTER — Encounter: Payer: Self-pay | Admitting: Family Medicine

## 2022-03-28 ENCOUNTER — Encounter: Payer: Self-pay | Admitting: Family Medicine

## 2022-05-10 ENCOUNTER — Other Ambulatory Visit: Payer: Self-pay

## 2022-05-10 ENCOUNTER — Ambulatory Visit: Payer: PPO | Admitting: Internal Medicine

## 2022-05-10 ENCOUNTER — Encounter: Payer: Self-pay | Admitting: Internal Medicine

## 2022-05-10 VITALS — BP 130/89 | HR 71 | Temp 97.8°F | Ht 73.0 in | Wt 160.0 lb

## 2022-05-10 DIAGNOSIS — A310 Pulmonary mycobacterial infection: Secondary | ICD-10-CM

## 2022-05-10 NOTE — Progress Notes (Signed)
[facility] for Infectious Disease  Patient Active Problem List   Diagnosis Date Noted   Mycobacterium avium infection ([place]) 08/18/2021    Priority: High   H/O radical prostatectomy 08/18/2021   Varicose vein of leg 12/08/2017   Hearing loss 12/08/2017   Hemorrhagic prepatellar bursitis of left knee 09/16/2015   Elevated TSH 06/02/2014   History of colon polyps    Basal cell cancer 04/08/2013   Prostate cancer ([place])    Hyperlipidemia 03/10/2009   Allergic rhinitis 08/06/2007    Patient's Medications  New Prescriptions   No medications on file  Previous Medications   ALBUTEROL (VENTOLIN HFA) 108 (90 BASE) MCG/ACT INHALER    Inhale 2 puffs into the lungs every 6 (six) hours as needed for wheezing or shortness of breath.   AZITHROMYCIN (ZITHROMAX) 500 MG TABLET    Take 1 tablet (500 mg total) by mouth 3 (three) times a week.   BENZONATATE (TESSALON) 200 MG CAPSULE    Take 1 capsule (200 mg total) by mouth 2 (two) times daily as needed for cough.   CETIRIZINE (ZYRTEC) 10 MG TABLET    Take 10 mg by mouth daily.   DOCUSATE SODIUM (COLACE) 100 MG CAPSULE    Take 100 mg by mouth daily.   ETHAMBUTOL (MYAMBUTOL) 400 MG TABLET    Take 4.5 tablets (1,800 mg total) by mouth 3 (three) times a week.   GUAIFENESIN (MUCINEX) 600 MG 12 HR TABLET    Take by mouth 2 (two) times daily as needed.   MELATONIN PO    Take by mouth as needed.   MULTIPLE VITAMIN (MULTIVITAMIN WITH MINERALS) TABS TABLET    Take 1 tablet by mouth daily.   NAPROXEN SODIUM (ALEVE) 220 MG TABLET    Take 220 mg by mouth as needed.   OMEGA-3 FATTY ACIDS (FISH OIL) 1000 MG CAPS    Take by mouth.   RIFAMPIN (RIFADIN) 300 MG CAPSULE    Take 2 capsules (600 mg total) by mouth 3 (three) times a week.  Modified Medications   No medications on file  Discontinued Medications   No medications on file    Subjective: [name] is in for his routine follow-up visit.  He started on azithromycin, ethambutol and rifampin on  11/30/2021 for his smoldering Mycobacterium avium pneumonia.  His sputum AFB smear and culture were negative the day he started therapy and again in late July.  He is tolerating his antibiotics well.  His appetite is good and he is gaining weight.  He is noted significant improvement with decreased cough, decreased sputum production and decreased dyspnea on exertion.  Review of Systems: Review of Systems  Constitutional:  Negative for fever and weight loss.  Respiratory:  Positive for cough, sputum production and shortness of breath. Negative for hemoptysis.   Cardiovascular:  Negative for chest pain.    Past Medical History:  Diagnosis Date   Adenoma 2008   serrated adenoma of colon   Diverticulosis    GERD (gastroesophageal reflux disease)    H. Pylori related-gone when treated   Hyperlipidemia    Prostate cancer [facility]) 1994   radical prostatectomy Dr. Gentry Fitz   Right inguinal hernia    Seasonal allergies    Sinusitis acute     Social History   Tobacco Use   Smoking status: Never   Smokeless tobacco: Never  Vaping Use   Vaping Use: Never used  Substance Use Topics   Alcohol use: Yes  Alcohol/week: 7.0 standard drinks of alcohol    Types: 7 Standard drinks or equivalent per week    Comment: glass wine with dinner   Drug use: Yes    Types: Amyl nitrate, IV    Family History  Problem Relation Age of Onset   Prostate cancer Paternal Uncle    Prostate cancer Cousin    Pneumonia Father        aspiration   Other Other 83       smoker. sudden death unclear cause   Colon cancer Neg Hx     No Known Allergies  Objective: Vitals:   05/10/22 1409  BP: 130/89  Pulse: 71  Temp: 97.8 F (36.6 C)  TempSrc: Temporal  SpO2: 99%  Weight: 160 lb (72.6 kg)  Height: '6\' 1"'$  (1.854 m)   Body mass index is 21.11 kg/m.  Physical Exam Constitutional:      Comments: His spirits are good as usual.  Cardiovascular:     Rate and Rhythm: Normal rate and regular rhythm.      Heart sounds: No murmur heard. Pulmonary:     Effort: Pulmonary effort is normal.     Breath sounds: Normal breath sounds.  Psychiatric:        Mood and Affect: Mood normal.     Lab Results    Problem List Items Addressed This Visit       High   Mycobacterium avium infection ([place]) - Primary    He has had an excellent response to initiation of therapy for Mycobacterium avium pneumonia.  He will continue his current 3 drug regimen and follow-up in 3 months.  I will repeat his sputum AFB smear and culture.      Relevant Orders   MYCOBACTERIA, CULTURE, WITH FLUOROCHROME SMEAR     Michel Bickers, MD [facility] [phone] pager   [phone] cell 05/10/2022, 2:34 PM

## 2022-05-10 NOTE — Assessment & Plan Note (Signed)
He has had an excellent response to initiation of therapy for Mycobacterium avium pneumonia.  He will continue his current 3 drug regimen and follow-up in 3 months.  I will repeat his sputum AFB smear and culture.

## 2022-05-11 ENCOUNTER — Other Ambulatory Visit (HOSPITAL_COMMUNITY): Payer: Self-pay

## 2022-05-11 ENCOUNTER — Other Ambulatory Visit: Payer: Self-pay

## 2022-05-11 ENCOUNTER — Other Ambulatory Visit: Payer: PPO

## 2022-05-11 DIAGNOSIS — A310 Pulmonary mycobacterial infection: Secondary | ICD-10-CM

## 2022-05-11 NOTE — Addendum Note (Signed)
Addended by: Caffie Pinto on: 05/11/2022 11:29 AM   Modules accepted: Orders

## 2022-05-11 NOTE — Addendum Note (Signed)
Addended by: Caffie Pinto on: 05/11/2022 11:31 AM   Modules accepted: Orders

## 2022-05-12 ENCOUNTER — Other Ambulatory Visit (HOSPITAL_COMMUNITY): Payer: Self-pay

## 2022-05-16 ENCOUNTER — Other Ambulatory Visit (HOSPITAL_COMMUNITY): Payer: Self-pay

## 2022-05-31 ENCOUNTER — Telehealth: Payer: Self-pay | Admitting: Family Medicine

## 2022-05-31 NOTE — Telephone Encounter (Signed)
Copied from [place] [id number]. Topic: Medicare AWV >> May 31, 2022 11:01 AM Gillis Santa wrote: Reason for CRM: LVM PT TO CALL [phone] TO R/S AWVS FROM 06/10/22 WITH [facility] COACH

## 2022-06-12 ENCOUNTER — Encounter: Payer: Self-pay | Admitting: Family Medicine

## 2022-06-13 ENCOUNTER — Ambulatory Visit (INDEPENDENT_AMBULATORY_CARE_PROVIDER_SITE_OTHER): Payer: PPO

## 2022-06-13 VITALS — Wt 160.0 lb

## 2022-06-13 DIAGNOSIS — Z0000 Encounter for general adult medical examination without abnormal findings: Secondary | ICD-10-CM | POA: Diagnosis not present

## 2022-06-13 NOTE — Progress Notes (Signed)
I connected with  Juan Lin on 06/13/22 by a audio enabled telemedicine application and verified that I am speaking with the correct person using two identifiers.  Patient Location: Home  Provider Location: Office/Clinic  I discussed the limitations of evaluation and management by telemedicine. The patient expressed understanding and agreed to proceed.   Subjective:   Juan Lin is a 81 y.o. male who presents for Medicare Annual/Subsequent preventive examination.  Review of Systems     Cardiac Risk Factors include: advanced age (>14mn, >>73women);dyslipidemia;male gender     Objective:    Today's Vitals   06/13/22 1442  Weight: 160 lb (72.6 kg)   Body mass index is 21.11 kg/m.     06/13/2022    2:50 PM 06/10/2021    1:07 PM 06/04/2020    2:43 PM 04/13/2020    8:21 AM 04/07/2020   10:49 AM 04/23/2019   12:02 PM 10/04/2017   10:11 AM  Advanced Directives  Does Patient Have a Medical Advance Directive? Yes Yes Yes Yes Yes Yes Yes  Type of [place]aramed[place]of [place] will Healthcare Power of [place] will Out of facility DNR (pink MOST or yellow form) [place] will Living will;Healthcare Power of Attorney Living will;Healthcare Power of Attorney  Does patient want to make changes to medical advance directive?    No - Patient declined No - Patient declined No - Patient declined No - Patient declined  Copy of HW[place]in Chart? No - copy requested No - copy requested No - copy requested  No - copy requested No - copy requested No - copy requested    Current Medications (verified) Outpatient Encounter Medications as of 06/13/2022  Medication Sig   azithromycin (ZITHROMAX) 500 MG tablet Take 1 tablet (500 mg total) by mouth 3 (three) times a week.   cetirizine (ZYRTEC) 10 MG tablet Take 10 mg by mouth daily.   ethambutol (MYAMBUTOL) 400 MG tablet Take 4.5  tablets (1,800 mg total) by mouth 3 (three) times a week.   Multiple Vitamin (MULTIVITAMIN WITH MINERALS) TABS tablet Take 1 tablet by mouth daily.   naproxen sodium (ALEVE) 220 MG tablet Take 220 mg by mouth as needed.   Omega-3 Fatty Acids (FISH OIL) 1000 MG CAPS Take by mouth.   rifampin (RIFADIN) 300 MG capsule Take 2 capsules (600 mg total) by mouth 3 (three) times a week.   guaiFENesin (MUCINEX) 600 MG 12 hr tablet Take by mouth 2 (two) times daily as needed. (Patient not taking: Reported on 06/13/2022)   MELATONIN PO Take by mouth as needed. (Patient not taking: Reported on 02/24/2022)   [DISCONTINUED] albuterol (VENTOLIN HFA) 108 (90 Base) MCG/ACT inhaler Inhale 2 puffs into the lungs every 6 (six) hours as needed for wheezing or shortness of breath. (Patient not taking: Reported on 02/24/2022)   [DISCONTINUED] benzonatate (TESSALON) 200 MG capsule Take 1 capsule (200 mg total) by mouth 2 (two) times daily as needed for cough. (Patient not taking: Reported on 02/24/2022)   [DISCONTINUED] docusate sodium (COLACE) 100 MG capsule Take 100 mg by mouth daily. (Patient not taking: Reported on 02/24/2022)   No facility-administered encounter medications on file as of 06/13/2022.    Allergies (verified) Patient has no known allergies.   History: Past Medical History:  Diagnosis Date   Adenoma 2008   serrated adenoma of colon   Diverticulosis    GERD (gastroesophageal reflux disease)    H.  Pylori related-gone when treated   Hyperlipidemia    Prostate cancer [facility]) 1994   radical prostatectomy Dr. Gentry Fitz   Right inguinal hernia    Seasonal allergies    Sinusitis acute    Past Surgical History:  Procedure Laterality Date   APPENDECTOMY     CATARACT EXTRACTION, BILATERAL Bilateral 2018   CHOLECYSTECTOMY  05/2009   INGUINAL HERNIA REPAIR Right 04/13/2020   Procedure: OPEN RIGHT INGUINAL HERNIA REPAIR;  Surgeon: Dwan Bolt, MD;  Location: [place];  Service:  General;  Laterality: Right;   prostatectomy  1994   DR.MOHLER [facility]   TONSILLECTOMY     Family History  Problem Relation Age of Onset   Prostate cancer Paternal Uncle    Prostate cancer Cousin    Pneumonia Father        aspiration   Other Other 91       smoker. sudden death unclear cause   Colon cancer Neg Hx    Social History   Socioeconomic History   Marital status: Married    Spouse name: Not on file   Number of children: Not on file   Years of education: Not on file   Highest education level: Not on file  Occupational History    Comment: currently volunteers as musician  Tobacco Use   Smoking status: Never   Smokeless tobacco: Never  Vaping Use   Vaping Use: Never used  Substance and Sexual Activity   Alcohol use: Yes    Alcohol/week: 7.0 standard drinks of alcohol    Types: 7 Standard drinks or equivalent per week    Comment: glass wine with dinner   Drug use: Yes    Types: Amyl nitrate, IV   Sexual activity: Not on file  Other Topics Concern   Not on file  Social History Narrative   From [place]    Came down to [employer] for graduate studies- Phd in botany.    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       Married 1985.1st marriage. No kids. No pets.       Hobbies- above + gardening      Social Determinants of Health   Financial Resource Strain: Low Risk  (06/13/2022)   Overall Financial Resource Strain (CARDIA)    Difficulty of Paying Living Expenses: Not hard at all  Food Insecurity: No Food Insecurity (06/13/2022)   Hunger Vital Sign    Worried About Running Out of Food in the Last Year: Never true    Ran Out of Food in the Last Year: Never true  Transportation Needs: No Transportation Needs (06/13/2022)   PRAPARE - Hydrologist (Medical): No    Lack of Transportation (Non-Medical): No   Physical Activity: Sufficiently Active (06/13/2022)   Exercise Vital Sign    Days of Exercise per Week: 4 days    Minutes of Exercise per Session: 60 min  Stress: No Stress Concern Present (06/13/2022)   [place]    Feeling of Stress : Not at all  Social Connections: [place] (06/13/2022)   Social Connection and Isolation Panel [NHANES]    Frequency of Communication with Friends and Family: Three times a week    Frequency of Social Gatherings with Friends and Family: More than three times a week  Attends Religious Services: 1 to 4 times per year    Active Member of Clubs or Organizations: Yes    Attends Archivist Meetings: 1 to 4 times per year    Marital Status: Married    Tobacco Counseling Counseling given: Not Answered   Clinical Intake:  Pre-visit preparation completed: Yes  Pain : No/denies pain     BMI - recorded: 21.11 Nutritional Status: BMI of 19-24  Normal Nutritional Risks: None Diabetes: No  How often do you need to have someone help you when you read instructions, pamphlets, or other written materials from your doctor or pharmacy?: 1 - Never  Diabetic?no  Interpreter Needed?: No  Information entered by :: Charlott Rakes, LPN   Activities of Daily Living    06/13/2022    2:51 PM 06/12/2022    3:42 PM  In your present state of health, do you have any difficulty performing the following activities:  Hearing? 1 0  Comment wears hearing aids   Vision? 0 0  Difficulty concentrating or making decisions? 0 0  Walking or climbing stairs? 0 0  Dressing or bathing? 0 0  Doing errands, shopping? 0 0  Preparing Food and eating ? N N  Using the Toilet? N N  In the past six months, have you accidently leaked urine? N N  Do you have problems with loss of bowel control? N N  Managing your Medications? N N  Managing your Finances? N N  Housekeeping or managing your  Housekeeping? N N    Patient Care Team: Marin Olp, MD as PCP - General (Family Medicine) Alanda Slim, Neena Rhymes, MD as Consulting Physician (Ophthalmology) Festus Aloe, MD as Consulting Physician (Urology) Sherilyn Banker, Bevely Palmer, MD as Consulting Physician (Audiology)  Indicate any recent Medical Services you may have received from other than [facility] providers in the past year (date may be approximate).     Assessment:   This is a routine wellness examination for Keino.  Hearing/Vision screen Hearing Screening - Comments:: Pt wears hearing aids  Vision Screening - Comments:: Pt follows up with Dr Sabra Heck for annual eye exams   Dietary issues and exercise activities discussed: Current Exercise Habits: Home exercise routine, Type of exercise: walking;Other - see comments (dance class and eliptical), Time (Minutes): 60, Frequency (Times/Week): 4, Weekly Exercise (Minutes/Week): 240   Goals Addressed             This Visit's Progress    Patient Stated       Finish medication regimen        Depression Screen    06/13/2022    2:47 PM 05/10/2022    2:10 PM 02/15/2022    2:10 PM 01/13/2022   10:53 AM 11/04/2021    3:29 PM 10/04/2021   12:11 PM 08/24/2021    4:12 PM  PHQ 2/9 Scores  PHQ - 2 Score 0 0 0 0 0 0 0    Fall Risk    06/13/2022    2:51 PM 06/12/2022    3:42 PM 05/10/2022    2:09 PM 02/15/2022    2:10 PM 01/13/2022   10:53 AM  Fall Risk   Falls in the past year? 0 0 0 0 0  Number falls in past yr: 0 0 0  0  Injury with Fall? 0 0 0  0  Risk for fall due to : Impaired vision  No Fall Risks No Fall Risks   Follow up Falls prevention discussed  Falls evaluation completed  Falls evaluation completed     FALL RISK PREVENTION PERTAINING TO THE HOME:  Any stairs in or around the home? Yes  If so, are there any without handrails? No  Home free of loose throw rugs in walkways, pet beds, electrical cords, etc? Yes  Adequate lighting in your home to reduce risk of  falls? Yes   ASSISTIVE DEVICES UTILIZED TO PREVENT FALLS:  Life alert? No  Use of a cane, walker or w/c? No  Grab bars in the bathroom? Yes  Shower chair or bench in shower? No  Elevated toilet seat or a handicapped toilet? No   TIMED UP AND GO:  Was the test performed? No .   Cognitive Function:        06/13/2022    2:52 PM 06/10/2021    1:12 PM 06/04/2020    2:48 PM 04/23/2019   12:03 PM  6CIT Screen  What Year? 0 points 0 points 0 points 0 points  What month? 0 points 0 points 0 points 0 points  What time? 0 points 0 points  0 points  Count back from 20 0 points 0 points 0 points 0 points  Months in reverse 0 points 0 points 0 points 0 points  Repeat phrase 0 points 0 points 0 points 0 points  Total Score 0 points 0 points  0 points    Immunizations Immunization History  Administered Date(s) Administered   Fluad Quad(high Dose 65+) 03/25/2022   Hepatitis A, Adult 12/16/1993, 07/28/1994   Influenza Whole 03/28/1999   Influenza,inj,Quad PF,6+ Mos 04/03/2013, 03/19/2014, 03/19/2021   Influenza-Unspecified 03/21/2011, 04/24/2015, 03/21/2016, 03/21/2017, 03/25/2018, 03/19/2020   PFIZER Comirnaty(Gray Top)Covid-19 Tri-Sucrose Vaccine 03/26/2022   PFIZER(Purple Top)SARS-COV-2 Vaccination 07/17/2019, 08/07/2019, 03/25/2020, 03/29/2021   Pneumococcal Conjugate-13 06/02/2014   Pneumococcal Polysaccharide-23 12/07/2006   Respiratory Syncytial Virus Vaccine,Recomb Aduvanted(Arexvy) 06/12/2022   Td 12/07/2006   Tdap 11/04/2015   Typhoid Inactivated 12/16/1993   Yellow Fever 12/16/1993   Zoster Recombinat (Shingrix) 12/13/2017, 02/21/2018   Zoster, Live 02/23/2010    TDAP status: Up to date  Flu Vaccine status: Up to date  Pneumococcal vaccine status: Up to date  Covid-19 vaccine status: Completed vaccines  Qualifies for Shingles Vaccine? Yes   Zostavax completed Yes   Shingrix Completed?: Yes  Screening Tests Health Maintenance  Topic Date Due   COVID-19  Vaccine (6 - 2023-24 season) 05/21/2022   Medicare Annual Wellness (AWV)  06/14/2023   DTaP/Tdap/Td (3 - Td or Tdap) 11/03/2025   Pneumonia Vaccine 30+ Years old  Completed   INFLUENZA VACCINE  Completed   Zoster Vaccines- Shingrix  Completed   HPV VACCINES  Aged Out    Health Maintenance  Health Maintenance Due  Topic Date Due   COVID-19 Vaccine (6 - 2023-24 season) 05/21/2022    Colorectal cancer screening: No longer required.     Additional Screening:   Vision Screening: Recommended annual ophthalmology exams for early detection of glaucoma and other disorders of the eye. Is the patient up to date with their annual eye exam?  Yes  Who is the provider or what is the name of the office in which the patient attends annual eye exams? Dr Sabra Heck  If pt is not established with a provider, would they like to be referred to a provider to establish care? No .   Dental Screening: Recommended annual dental exams for proper oral hygiene  Community Resource Referral / Chronic Care Management: CRR required this visit?  No   CCM required this visit?  No      Plan:     I have personally reviewed and noted the following in the patient's chart:   Medical and social history Use of alcohol, tobacco or illicit drugs  Current medications and supplements including opioid prescriptions. Patient is not currently taking opioid prescriptions. Functional ability and status Nutritional status Physical activity Advanced directives List of other physicians Hospitalizations, surgeries, and ER visits in previous 12 months Vitals Screenings to include cognitive, depression, and falls Referrals and appointments  In addition, I have reviewed and discussed with patient certain preventive protocols, quality metrics, and best practice recommendations. A written personalized care plan for preventive services as well as general preventive health recommendations were provided to patient.     Willette Brace, LPN   09/32/[id number]   Nurse Notes: none

## 2022-06-13 NOTE — Patient Instructions (Signed)
Juan Lin , Thank you for taking time to come for your Medicare Wellness Visit. I appreciate your ongoing commitment to your health goals. Please review the following plan we discussed and let me know if I can assist you in the future.   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;      Maintain current health status      Patient Stated     Continue with exercises      Patient Stated     Stay healthy      Patient Stated     Juan Lin medication regimen         This is a list of the screening recommended for you and due dates:  Health Maintenance  Topic Date Due   COVID-19 Vaccine (6 - 2023-24 season) 05/21/2022   Medicare Annual Wellness Visit  06/14/2023   DTaP/Tdap/Td vaccine (3 - Td or Tdap) 11/03/2025   Pneumonia Vaccine  Completed   Flu Shot  Completed   Zoster (Shingles) Vaccine  Completed   HPV Vaccine  Aged Out    Advanced directives: Please bring a copy of your health care power of attorney and living will to the office at your convenience.  Conditions/risks identified: get finished with medication for ABT treatment   Next appointment: Follow up in one year for your annual wellness visit.   Preventive Care 20 Years and Older, Male  Preventive care refers to lifestyle choices and visits with your health care provider that can promote health and wellness. What does preventive care include? A yearly physical exam. This is also called an annual well check. Dental exams once or twice a year. Routine eye exams. Ask your health care provider how often you should have your eyes checked. Personal lifestyle choices, including: Daily care of your teeth and gums. Regular physical activity. Eating a healthy diet. Avoiding tobacco and drug use. Limiting alcohol use. Practicing safe sex. Taking low doses of aspirin every day. Taking vitamin and mineral supplements  as recommended by your health care provider. What happens during an annual well check? The services and screenings done by your health care provider during your annual well check will depend on your age, overall health, lifestyle risk factors, and family history of disease. Counseling  Your health care provider may ask you questions about your: Alcohol use. Tobacco use. Drug use. Emotional well-being. Home and relationship well-being. Sexual activity. Eating habits. History of falls. Memory and ability to understand (cognition). Work and work Statistician. Screening  You may have the following tests or measurements: Height, weight, and BMI. Blood pressure. Lipid and cholesterol levels. These may be checked every 5 years, or more frequently if you are over 49 years old. Skin check. Lung cancer screening. You may have this screening every year starting at age 32 if you have a 30-pack-year history of smoking and currently smoke or have quit within the past 15 years. Fecal occult blood test (FOBT) of the stool. You may have this test every year starting at age 24. Flexible sigmoidoscopy or colonoscopy. You may have a sigmoidoscopy every 5 years or a colonoscopy every 10 years starting at age 64. Prostate cancer screening. Recommendations will vary depending on your family history and other risks. Hepatitis C blood test. Hepatitis B blood test. Sexually transmitted disease (STD) testing. Diabetes screening. This is done  by checking your blood sugar (glucose) after you have not eaten for a while (fasting). You may have this done every 1-3 years. Abdominal aortic aneurysm (AAA) screening. You may need this if you are a current or former smoker. Osteoporosis. You may be screened starting at age 64 if you are at high risk. Talk with your health care provider about your test results, treatment options, and if necessary, the need for more tests. Vaccines  Your health care provider may recommend  certain vaccines, such as: Influenza vaccine. This is recommended every year. Tetanus, diphtheria, and acellular pertussis (Tdap, Td) vaccine. You may need a Td booster every 10 years. Zoster vaccine. You may need this after age 59. Pneumococcal 13-valent conjugate (PCV13) vaccine. One dose is recommended after age 34. Pneumococcal polysaccharide (PPSV23) vaccine. One dose is recommended after age 31. Talk to your health care provider about which screenings and vaccines you need and how often you need them. This information is not intended to replace advice given to you by your health care provider. Make sure you discuss any questions you have with your health care provider. Document Released: 07/10/2015 Document Revised: 03/02/2016 Document Reviewed: 04/14/2015 Elsevier Interactive Patient Education  2017 [place] Prevention in the Home Falls can cause injuries. They can happen to people of all ages. There are many things you can do to make your home safe and to help prevent falls. What can I do on the outside of my home? Regularly fix the edges of walkways and driveways and fix any cracks. Remove anything that might make you trip as you walk through a door, such as a raised step or threshold. Trim any bushes or trees on the path to your home. Use bright outdoor lighting. Clear any walking paths of anything that might make someone trip, such as rocks or tools. Regularly check to see if handrails are loose or broken. Make sure that both sides of any steps have handrails. Any raised decks and porches should have guardrails on the edges. Have any leaves, snow, or ice cleared regularly. Use sand or salt on walking paths during winter. Clean up any spills in your garage right away. This includes oil or grease spills. What can I do in the bathroom? Use night lights. Install grab bars by the toilet and in the tub and shower. Do not use towel bars as grab bars. Use non-skid mats or  decals in the tub or shower. If you need to sit down in the shower, use a plastic, non-slip stool. Keep the floor dry. Clean up any water that spills on the floor as soon as it happens. Remove soap buildup in the tub or shower regularly. Attach bath mats securely with double-sided non-slip rug tape. Do not have throw rugs and other things on the floor that can make you trip. What can I do in the bedroom? Use night lights. Make sure that you have a light by your bed that is easy to reach. Do not use any sheets or blankets that are too big for your bed. They should not hang down onto the floor. Have a firm chair that has side arms. You can use this for support while you get dressed. Do not have throw rugs and other things on the floor that can make you trip. What can I do in the kitchen? Clean up any spills right away. Avoid walking on wet floors. Keep items that you use a lot in easy-to-reach places. If you need to  reach something above you, use a strong step stool that has a grab bar. Keep electrical cords out of the way. Do not use floor polish or wax that makes floors slippery. If you must use wax, use non-skid floor wax. Do not have throw rugs and other things on the floor that can make you trip. What can I do with my stairs? Do not leave any items on the stairs. Make sure that there are handrails on both sides of the stairs and use them. Fix handrails that are broken or loose. Make sure that handrails are as long as the stairways. Check any carpeting to make sure that it is firmly attached to the stairs. Fix any carpet that is loose or worn. Avoid having throw rugs at the top or bottom of the stairs. If you do have throw rugs, attach them to the floor with carpet tape. Make sure that you have a light switch at the top of the stairs and the bottom of the stairs. If you do not have them, ask someone to add them for you. What else can I do to help prevent falls? Wear shoes that: Do not  have high heels. Have rubber bottoms. Are comfortable and fit you well. Are closed at the toe. Do not wear sandals. If you use a stepladder: Make sure that it is fully opened. Do not climb a closed stepladder. Make sure that both sides of the stepladder are locked into place. Ask someone to hold it for you, if possible. Clearly mark and make sure that you can see: Any grab bars or handrails. First and last steps. Where the edge of each step is. Use tools that help you move around (mobility aids) if they are needed. These include: Canes. Walkers. Scooters. Crutches. Turn on the lights when you go into a dark area. Replace any light bulbs as soon as they burn out. Set up your furniture so you have a clear path. Avoid moving your furniture around. If any of your floors are uneven, fix them. If there are any pets around you, be aware of where they are. Review your medicines with your doctor. Some medicines can make you feel dizzy. This can increase your chance of falling. Ask your doctor what other things that you can do to help prevent falls. This information is not intended to replace advice given to you by your health care provider. Make sure you discuss any questions you have with your health care provider. Document Released: 04/09/2009 Document Revised: 11/19/2015 Document Reviewed: 07/18/2014 Elsevier Interactive Patient Education  2017 [employer].

## 2022-06-20 LAB — MYCOBACTERIA,CULT W/FLUOROCHROME SMEAR
MICRO NUMBER:: 14197148
SMEAR:: NONE SEEN
SPECIMEN QUALITY:: ADEQUATE

## 2022-07-11 DIAGNOSIS — C61 Malignant neoplasm of prostate: Secondary | ICD-10-CM | POA: Diagnosis not present

## 2022-07-18 DIAGNOSIS — C61 Malignant neoplasm of prostate: Secondary | ICD-10-CM | POA: Diagnosis not present

## 2022-07-27 DIAGNOSIS — C61 Malignant neoplasm of prostate: Secondary | ICD-10-CM | POA: Diagnosis not present

## 2022-08-09 ENCOUNTER — Ambulatory Visit (INDEPENDENT_AMBULATORY_CARE_PROVIDER_SITE_OTHER): Payer: PPO | Admitting: Internal Medicine

## 2022-08-09 ENCOUNTER — Other Ambulatory Visit: Payer: Self-pay

## 2022-08-09 ENCOUNTER — Ambulatory Visit: Payer: PPO | Admitting: Internal Medicine

## 2022-08-09 ENCOUNTER — Encounter: Payer: Self-pay | Admitting: Internal Medicine

## 2022-08-09 VITALS — BP 156/111 | HR 81 | Temp 97.7°F | Ht 73.0 in | Wt 160.0 lb

## 2022-08-09 DIAGNOSIS — A310 Pulmonary mycobacterial infection: Secondary | ICD-10-CM

## 2022-08-09 NOTE — Assessment & Plan Note (Signed)
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  However, his most recent sputum culture turned positive again.  He will continue his 3 times weekly antibiotic regimen for now, and I will get a repeat sputum culture and chest x-ray.  He will follow-up in 4 weeks.

## 2022-08-09 NOTE — Progress Notes (Signed)
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   CETIRIZINE (ZYRTEC) 10 MG TABLET    Take 10 mg by mouth daily.   ENZALUTAMIDE (XTANDI) 40 MG CAPSULE       ETHAMBUTOL (MYAMBUTOL) 400 MG TABLET    Take 4.5 tablets (1,800 mg total) by mouth 3 (three) times a week.   GUAIFENESIN (MUCINEX) 600 MG 12 HR TABLET    Take by mouth 2 (two) times daily as needed.   MELATONIN PO    Take by mouth as needed.   MULTIPLE VITAMIN (MULTIVITAMIN WITH MINERALS) TABS TABLET    Take 1 tablet by mouth daily.   NAPROXEN SODIUM (ALEVE) 220 MG TABLET    Take 220 mg by mouth as needed.   OMEGA-3 FATTY ACIDS (FISH OIL) 1000 MG CAPS    Take by mouth.   RIFAMPIN (RIFADIN) 300 MG CAPSULE    Take 2 capsules (600 mg total) by mouth 3 (three) times a week.  Modified Medications   No medications on file  Discontinued Medications   No medications on file    Subjective: Juan Lin is in for his routine follow-up visit.  He started on azithromycin, ethambutol and rifampin on 11/30/2021 for his smoldering Mycobacterium avium pneumonia.  His sputum AFB smear and culture were negative the day he started therapy and again in late July but was positive again in November.    He is tolerating his antibiotics well and never misses doses.  His appetite is good and he is gaining weight.  He is  noted significant improvement with decreased cough, decreased sputum production and decreased dyspnea on exertion.  Review of Systems: Review of Systems  Constitutional:  Negative for fever and weight loss.  Respiratory:  Positive for cough, sputum production and shortness of breath. Negative for hemoptysis.   Cardiovascular:  Negative for chest pain.    Past Medical History:  Diagnosis Date   Adenoma 2008   serrated adenoma of colon   Diverticulosis    GERD (gastroesophageal reflux disease)    H. Pylori related-gone when treated   Hyperlipidemia    Prostate cancer [facility]) 1994   radical prostatectomy Dr. Gentry Lin   Right inguinal hernia    Seasonal allergies    Sinusitis acute     Social History   Tobacco Use   Smoking status: Never   Smokeless tobacco: Never  Vaping Use   Vaping Use: Never used  Substance Use Topics   Alcohol use: Yes    Alcohol/week: 7.0 standard drinks of alcohol    Types: 7 Standard drinks or equivalent per week    Comment: glass wine with dinner   Drug use: Yes    Types: Amyl nitrate, IV    Family History  Problem Relation Age of Onset  Prostate cancer Paternal Uncle    Prostate cancer Cousin    Pneumonia Father        aspiration   Other Other 39       smoker. sudden death unclear cause   Colon cancer Neg Hx     No Known Allergies  Objective: Vitals:   08/09/22 1415  BP: (!) 148/99  Pulse: 79  Temp: 97.7 F (36.5 C)  TempSrc: Temporal  SpO2: 97%  Weight: 160 lb (72.6 kg)  Height: 6' 1"$  (1.854 m)   Body mass index is 21.11 kg/m.  Physical Exam Constitutional:      Comments: His spirits are good as usual.  Cardiovascular:     Rate and Rhythm: Normal rate.  Pulmonary:     Effort: Pulmonary effort is normal.  Psychiatric:        Mood and Affect: Mood normal.     Lab Results    Problem List Items Addressed This Visit       High   Mycobacterium avium infection ([place]) - Primary    He continues to show significant  clinical improvement since starting therapy for Mycobacterium avium pneumonia 7 months ago.  However, his most recent sputum culture turned positive again.  He will continue his 3 times weekly antibiotic regimen for now, and I will get a repeat sputum culture and chest x-ray.  He will follow-up in 4 weeks.      Relevant Orders   MYCOBACTERIA, CULTURE, WITH FLUOROCHROME SMEAR   DG Chest 2 View    Juan Bickers, MD [facility] for Infectious [place] [facility] [phone] pager   [phone] cell 08/09/2022, 2:39 PM

## 2022-08-15 ENCOUNTER — Telehealth: Payer: Self-pay | Admitting: Family Medicine

## 2022-08-15 NOTE — Telephone Encounter (Signed)
Access Nurse, Marcie Bal states; - She informed patient that final disposition is to go to ED.  - Patient refused, stating this is not a cardiac event; Prefers to be seen in office.   Please Advise.

## 2022-08-15 NOTE — Telephone Encounter (Signed)
Patient sates; - Has been taking xtandi 40 mg daily; found this could cause high BP  - Has been experiencing higher bp readings and throbbing sensation in back of head at night  - BP this morning was 160/110  Patient has been transferred to triage.

## 2022-08-16 NOTE — Telephone Encounter (Signed)
FYI, unsure why this was not addressed yesterday or brought to your attention.

## 2022-08-16 NOTE — Telephone Encounter (Signed)
Please call and check on him- if they advised ED that would be my advise but if he still refuses we can see him at next available appointment (against medical advise to seek more immediate care in ED)

## 2022-08-16 NOTE — Telephone Encounter (Signed)
Pt refused ED as was advised by access nurse. Please advise on what pt should do.  Patient Name: Juan Lin Gender: Male DOB: [birth year] Age: 82 Y 11 M 3 D Return Phone Number: [phone] (Primary), [phone] (Secondary) Address: City/ State/ Zip: [place]  [address] Client [place] [facility] [place] [facility] [place] [facility] Provider Garret Reddish- MD Contact Type Call Who Is Calling Patient / Member / Family / Caregiver Call Type Triage / Clinical Relationship To Patient Self Return Phone Number [phone] (Primary) Chief Complaint CHEST PAIN - pain, pressure, heaviness or tightness Reason for Call Symptomatic / Request for Wall states she is calling from the front office having high blood pressure symptoms. Last b/ p reading was 160/110 his current symptoms is throbbing in the back of his head at night only. Caller states he is having pressure in his head and chest. Translation No Nurse Assessment Nurse: Alvis Lemmings, RN, Marcie Bal Date/Time [name] Time): 08/15/2022 11:27:53 AM Confirm and document reason for call. If symptomatic, describe symptoms. ---Caller states she is calling from the front office having high blood pressure symptoms. Last b/ p reading was 160/110 his current symptoms is throbbing in the back of his head at night only. Caller states he is having pressure in his head and chest. Has never had this before. Dani Gobble med for PSA elevation started a week ago. Does the patient have any new or worsening symptoms? ---Yes Will a triage be completed? ---Yes Related visit to physician within the last 2 weeks? ---Yes Does the PT have any chronic conditions? (i.e. diabetes, asthma, this includes High risk factors for pregnancy, etc.) ---Yes List chronic conditions. ---PSA elevation, 3 mac disease, 3 antibiotics Is this a behavioral health or substance  abuse call? ---No Guidelines Guideline Title Affirmed Question Affirmed Notes Nurse Date/Time (Eastern Time) Blood Pressure - High [id number] Systolic BP >= 0000000 OR Diastolic >= [id number] AND [id number] cardiac (e.g., breathing difficulty, chest pain) or neurologic symptoms (e.g., new-onset blurred or double vision, unsteady gait) Alvis Lemmings, RN, Marcie Bal 08/15/2022 11:29:57 AM Disp. Time [name] Time) Disposition Final User 08/15/2022 11:23:31 AM Send to Urgent Queue Lyda Kalata 08/15/2022 11:31:49 AM Go to ED Now Yes Alvis Lemmings, RN, Marcie Bal Final Disposition 08/15/2022 11:31:49 AM Go to ED Now Yes Alvis Lemmings, RN, Lenon Oms Disagree/Comply Disagree Caller Understands Yes PreDisposition Call Doctor Care Advice Given Per Guideline GO TO ED NOW: * You need to be seen in the Emergency Department. * Go to the ED at ___________ [place] now. Drive carefully. NOTE TO TRIAGER - DRIVING: * Another adult should drive. CARE ADVICE given per High Blood Pressure (Adult) guideline. * Patient should not delay going to the emergency department. * Passes out or faints CALL EMS 911 IF: * Becomes confused * Becomes too weak to stand * You become worse Comments User: Manning Charity, RN Date/Time (Eastern Time): 08/15/2022 11:37:03 AM called Havelyn at office backline and given report of refusal to go to ER. She will reach back out to pt. after speaking to Dr Jerline Pain. Referrals GO TO FACILITY REFUSED

## 2022-08-17 NOTE — Telephone Encounter (Signed)
I called pt wife informed me that Juan Lin was out volunteering at [facility] cone, states since he couldn't get appt here, went to other physician office and was told to come off medication, pt came off, bp went down and then took again it was back up so they stopped it all together. Pt wanted office visit but stated they will have other Dr. Help with BP.

## 2022-09-02 ENCOUNTER — Ambulatory Visit (INDEPENDENT_AMBULATORY_CARE_PROVIDER_SITE_OTHER): Payer: PPO | Admitting: Family Medicine

## 2022-09-02 ENCOUNTER — Encounter: Payer: Self-pay | Admitting: Family Medicine

## 2022-09-02 ENCOUNTER — Other Ambulatory Visit (HOSPITAL_COMMUNITY): Payer: Self-pay

## 2022-09-02 VITALS — BP 134/88 | HR 73 | Temp 97.6°F | Ht 73.0 in | Wt 154.6 lb

## 2022-09-02 DIAGNOSIS — C61 Malignant neoplasm of prostate: Secondary | ICD-10-CM | POA: Diagnosis not present

## 2022-09-02 DIAGNOSIS — I158 Other secondary hypertension: Secondary | ICD-10-CM | POA: Diagnosis not present

## 2022-09-02 MED ORDER — VALSARTAN 80 MG PO TABS
80.0000 mg | ORAL_TABLET | Freq: Every day | ORAL | 5 refills | Status: DC
  Filled 2022-09-02: qty 30, 30d supply, fill #0
  Filled 2022-09-25: qty 30, 30d supply, fill #1

## 2022-09-02 NOTE — Patient Instructions (Addendum)
Start valsartan '80mg'$  if blood pressure is above 140/90 after you start the xtandi back.  If blood pressure stays above 140/90 in the course of a week- let me know- I may need to tweak the medicine  Recommended follow up: Return in about 2 weeks (around 09/16/2022) for followup or sooner if needed.Schedule b4 you leave. And to recheck kidney function

## 2022-09-02 NOTE — Progress Notes (Signed)
Phone [phone] In person visit   Subjective:   Juan Lin is a 82 y.o. year old very pleasant male patient who presents for/with See problem oriented charting Chief Complaint  Patient presents with   BP    Pt is here to discuss BP after taking Dani Gobble he started a month ago and his bp has been increasing which is a side affect of the new med with the highest being 178/108   Past Medical History-  Patient Active Problem List   Diagnosis Date Noted   Elevated TSH 06/02/2014    Priority: Medium    Prostate cancer ([place])     Priority: Medium    Hyperlipidemia 03/10/2009    Priority: Medium    History of colon polyps     Priority: Low   Basal cell cancer 04/08/2013    Priority: Low   Allergic rhinitis 08/06/2007    Priority: Low   H/O radical prostatectomy 08/18/2021   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 36 tablet 3   cetirizine (ZYRTEC) 10 MG tablet Take 10 mg by mouth daily.     enzalutamide (XTANDI) 40 MG capsule      ethambutol (MYAMBUTOL) 400 MG tablet Take 4.5 tablets (1,800 mg total) by mouth 3 (three) times a week. 162 tablet 3   Multiple Vitamin (MULTIVITAMIN WITH MINERALS) TABS tablet Take 1 tablet by mouth daily.     naproxen sodium (ALEVE) 220 MG tablet Take 220 mg by mouth as needed.     Omega-3 Fatty Acids (FISH OIL) 1000 MG CAPS Take by mouth.     rifampin (RIFADIN) 300 MG capsule Take 2 capsules (600 mg total) by mouth 3 (three) times a week. 72 capsule 3   valsartan (DIOVAN) 80 MG tablet Take 1 tablet (80 mg total) by mouth daily. 30 tablet 5   guaiFENesin (MUCINEX) 600 MG 12 hr tablet Take by mouth 2 (two) times daily as  needed. (Patient not taking: Reported on 06/13/2022)     MELATONIN PO Take by mouth as needed. (Patient not taking: Reported on 02/24/2022)     No current facility-administered medications for this visit.     Objective:  BP 134/88   Pulse 73   Temp 97.6 F (36.4 C)   Ht '6\' 1"'$  (1.854 m)   Wt 154 lb 9.6 oz (70.1 kg)   SpO2 98%   BMI 20.40 kg/m  Gen: NAD, resting comfortably CV: RRR no murmurs rubs or gallops Lungs: CTAB no crackles, wheeze, rhonchi Ext: Varicose veins noted under compression stockings Skin: warm, dry     Assessment and Plan   #prostate cancer-  on xtandi and was told blood pressure increases could be a side effect- he noted within 2 weeks significant increases -plans to start on Monday- full dose  #hypertension as side effect of medication S: medication: none Home readings #s: highest he has seen at home 178/108 but typically 150s or 160 over on full dose, they then reduced to half dose and still remained about the same- lowest 148/94  but up to 170/106 and then stopped 26th of february BP Readings from Last 3 Encounters:  09/02/22 134/88  08/09/22 (!) 156/111  05/10/22 130/89  A/P: Hypertension is side effect of prostate cancer treatment-he would like to start back on the prostate cancer treatment on Monday night.  We are trying to work around this with blood pressure medication (has some hyponatremia so hold off on hydrochlorothiazide for now, amlodipine interacts with rifampin)-From AVS "  Patient Instructions  Start valsartan '80mg'$  if blood pressure is above 140/90 after you start the xtandi back.  If blood pressure stays above 140/90 in the course of a week- let me know- I may need to tweak the medicine  Recommended follow up: Return in about 2 weeks (around 09/16/2022) for followup or sooner if needed.Schedule b4 you leave. And to recheck kidney function "  Future Appointments  Date Time Provider [place]  09/16/2022  2:20 PM Marin Olp,  MD [facility]  09/20/2022  2:15 PM Michel Bickers, MD [facility]  06/19/2023  2:00 PM [facility] HE[facility] COACH [facility]    Lab/Order associations:   ICD-10-CM   1. Other secondary hypertension  I15.8       Meds ordered this encounter  Medications   valsartan (DIOVAN) 80 MG tablet    Sig: Take 1 tablet (80 mg total) by mouth daily.    Dispense:  30 tablet    Refill:  5    Return precautions advised.  Garret Reddish, MD

## 2022-09-09 ENCOUNTER — Ambulatory Visit: Payer: PPO | Admitting: Family Medicine

## 2022-09-16 ENCOUNTER — Encounter: Payer: Self-pay | Admitting: Family Medicine

## 2022-09-16 ENCOUNTER — Ambulatory Visit (INDEPENDENT_AMBULATORY_CARE_PROVIDER_SITE_OTHER): Payer: PPO | Admitting: Family Medicine

## 2022-09-16 ENCOUNTER — Encounter: Payer: Self-pay | Admitting: Internal Medicine

## 2022-09-16 VITALS — BP 118/82 | HR 71 | Temp 98.6°F | Ht 73.0 in | Wt 158.0 lb

## 2022-09-16 DIAGNOSIS — I158 Other secondary hypertension: Secondary | ICD-10-CM | POA: Diagnosis not present

## 2022-09-16 DIAGNOSIS — C61 Malignant neoplasm of prostate: Secondary | ICD-10-CM

## 2022-09-16 DIAGNOSIS — A310 Pulmonary mycobacterial infection: Secondary | ICD-10-CM

## 2022-09-16 NOTE — Progress Notes (Signed)
Phone [phone] In person visit   Subjective:   Juan Lin is a 82 y.o. year old very pleasant male patient who presents for/with See problem oriented charting Chief Complaint  Patient presents with   Follow-up   Hypertension    Wants to know if valsartan is at max dose    Past Medical History-  Patient Active Problem List   Diagnosis Date Noted   Elevated TSH 06/02/2014    Priority: Medium    Prostate cancer ([place])     Priority: Medium    Hyperlipidemia 03/10/2009    Priority: Medium    History of colon polyps     Priority: Low   Basal cell cancer 04/08/2013    Priority: Low   Allergic rhinitis 08/06/2007    Priority: Low   H/O radical prostatectomy 08/18/2021   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 36 tablet 3   cetirizine (ZYRTEC) 10 MG tablet Take 10 mg by mouth daily.     enzalutamide (XTANDI) 40 MG capsule      ethambutol (MYAMBUTOL) 400 MG tablet Take 4.5 tablets (1,800 mg total) by mouth 3 (three) times a week. 162 tablet 3   MELATONIN PO Take by mouth as needed.     Multiple Vitamin (MULTIVITAMIN WITH MINERALS) TABS tablet Take 1 tablet by mouth daily.     naproxen sodium (ALEVE) 220 MG tablet Take 220 mg by mouth as needed.     Omega-3 Fatty Acids (FISH OIL) 1000 MG CAPS Take by mouth.     rifampin (RIFADIN) 300 MG capsule Take 2 capsules (600 mg total) by mouth 3 (three) times a week. 72 capsule 3   valsartan (DIOVAN) 80 MG tablet Take 1 tablet (80 mg total) by mouth daily. 30 tablet 5   guaiFENesin (MUCINEX) 600 MG 12 hr tablet Take by mouth 2 (two) times daily as needed. (Patient not taking: Reported on 06/13/2022)      No current facility-administered medications for this visit.     Objective:  BP 118/82   Pulse 71   Temp 98.6 F (37 C)   Ht 6\' 1"  (1.854 m)   Wt 158 lb (71.7 kg)   SpO2 96%   BMI 20.85 kg/m  Gen: NAD, resting comfortably     Assessment and Plan   # Prostate cancer-on xtandi and was told blood pressure increases will be a side effect-last visit he was off medication and blood pressure was 124/88 and we had a plan to start him on valsartan if blood pressure increased when he restarted medication. - he started with 1 pill of xtandi- then when went up to 4 pills of xtandi or 160mg  and noted BP increasing again- as below -hoping for stability on xtandi- continue current medications - try to increase dose as below  #hypertension S: medication: Valsartan 80 mg Home readings #s: on valsartan 80 mg trended down to 100/70 then started 4 pills of extandi or 160mg  and BP rose again typically 130s-150s/high 80s/90s then went back to 2 pills or 80 mg of xtandi and blood pressure mainly 120s to 130s over high 80s to to 90 though had on ereading 100/68 BP Readings from Last  3 Encounters:  09/16/22 118/82  09/02/22 134/88  08/09/22 (!) 156/111  A/P: blood pressure looks better despite taking xtandi 2 pills or 80mg - lets continue current medicine but since you are going to increase xtandi back to 4 tabltes or 160 mg, monitor blood pressure and if above 135/85 consistently lets have you take 2 valsartan at that point- and update me perhaps a week afterwards -also check bmp to assess any change in cr on valsartan- want to avoid 30% reduction   Recommended follow up: Return in about 1 month (around 10/17/2022) for followup or sooner if needed.Schedule b4 you leave. Future Appointments  Date Time Provider [place]  09/20/2022  2:15 PM Michel Bickers, MD [facility]  10/27/2022 10:20 AM Marin Olp, MD [facility]  06/19/2023  2:00 PM [facility] ANNUAL WELLNESS VISIT 1 [facility]     Lab/Order associations:   ICD-10-CM   1. Other secondary hypertension  [id number] Basic metabolic panel    Basic metabolic panel    CANCELED: Basic metabolic panel    2. Prostate cancer ([place])  C61     3. Mycobacterium avium infection ([place])  A31.0 DG Chest 2 View      No orders of the defined types were placed in this encounter.   Return precautions advised.  Garret Reddish, MD

## 2022-09-16 NOTE — Patient Instructions (Addendum)
blood pressure looks better despite taking xtandi 2 pills or 80mg - lets continue current medicine but since you are going to increase xtandi back to 4 tabltes or 160 mg, monitor blood pressure and if above 135/85 consistently lets have you take 2 valsartan at that point- and update me perhaps a week afterwards  Please stop by lab before you go If you have mychart- we will send your results within 3 business days of [place] receiving them.  If you do not have mychart- we will call you about results within 5 business days of [place] receiving them.  *please also note that you will see labs on mychart as soon as they post. I will later go in and write notes on them- will say "notes from Dr. Yong Channel"   Recommended follow up: Return in about 1 month (around 10/17/2022) for followup or sooner if needed.Schedule b4 you leave.

## 2022-09-17 LAB — BASIC METABOLIC PANEL
BUN: 20 mg/dL (ref 7–25)
CO2: 20 mmol/L (ref 20–32)
Calcium: 8.7 mg/dL (ref 8.6–10.3)
Chloride: 102 mmol/L (ref 98–110)
Creat: 0.99 mg/dL (ref 0.70–1.22)
Glucose, Bld: 92 mg/dL (ref 65–99)
Potassium: 4.3 mmol/L (ref 3.5–5.3)
Sodium: 134 mmol/L — ABNORMAL LOW (ref 135–146)

## 2022-09-20 ENCOUNTER — Other Ambulatory Visit: Payer: Self-pay

## 2022-09-20 ENCOUNTER — Other Ambulatory Visit (HOSPITAL_COMMUNITY): Payer: Self-pay

## 2022-09-20 ENCOUNTER — Ambulatory Visit (INDEPENDENT_AMBULATORY_CARE_PROVIDER_SITE_OTHER): Payer: PPO | Admitting: Internal Medicine

## 2022-09-20 ENCOUNTER — Encounter: Payer: Self-pay | Admitting: Internal Medicine

## 2022-09-20 ENCOUNTER — Ambulatory Visit
Admission: RE | Admit: 2022-09-20 | Discharge: 2022-09-20 | Disposition: A | Discharge status: Home / Self Care | Payer: PPO | Source: Ambulatory Visit | Attending: Internal Medicine | Admitting: Internal Medicine

## 2022-09-20 VITALS — BP 137/92 | HR 77 | Temp 97.6°F | Ht 73.0 in | Wt 159.0 lb

## 2022-09-20 DIAGNOSIS — M47814 Spondylosis without myelopathy or radiculopathy, thoracic region: Secondary | ICD-10-CM | POA: Diagnosis not present

## 2022-09-20 DIAGNOSIS — A310 Pulmonary mycobacterial infection: Secondary | ICD-10-CM

## 2022-09-20 DIAGNOSIS — R918 Other nonspecific abnormal finding of lung field: Secondary | ICD-10-CM | POA: Diagnosis not present

## 2022-09-20 MED ORDER — AZITHROMYCIN 500 MG PO TABS
500.0000 mg | ORAL_TABLET | ORAL | 11 refills | Status: DC
  Filled 2022-09-20 – 2022-10-27 (×2): qty 30, 70d supply, fill #0

## 2022-09-20 MED ORDER — ETHAMBUTOL HCL 400 MG PO TABS
1800.0000 mg | ORAL_TABLET | ORAL | 11 refills | Status: DC
  Filled 2022-09-20 – 2022-10-27 (×2): qty 162, 84d supply, fill #0

## 2022-09-20 MED ORDER — RIFAMPIN 300 MG PO CAPS
600.0000 mg | ORAL_CAPSULE | ORAL | 11 refills | Status: DC
  Filled 2022-09-20 – 2022-10-27 (×2): qty 60, 70d supply, fill #0

## 2022-09-20 NOTE — Progress Notes (Signed)
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   ENZALUTAMIDE (XTANDI) 40 MG CAPSULE       GUAIFENESIN (MUCINEX) 600 MG 12 HR TABLET    Take by mouth 2 (two) times daily as needed.   MELATONIN PO    Take by mouth as needed.   MULTIPLE VITAMIN (MULTIVITAMIN WITH MINERALS) TABS TABLET    Take 1 tablet by mouth daily.   NAPROXEN SODIUM (ALEVE) 220 MG TABLET    Take 220 mg by mouth as needed.   OMEGA-3 FATTY ACIDS (FISH OIL) 1000 MG CAPS    Take by mouth.   VALSARTAN (DIOVAN) 80 MG TABLET    Take 1 tablet (80 mg total) by mouth daily.  Modified Medications   Modified Medication Previous Medication   AZITHROMYCIN (ZITHROMAX) 500 MG TABLET azithromycin (ZITHROMAX) 500 MG tablet      Take 1 tablet (500 mg total) by mouth 3 (three) times a week.    Take 1 tablet (500 mg total) by mouth 3 (three) times a week.   ETHAMBUTOL (MYAMBUTOL) 400 MG TABLET ethambutol (MYAMBUTOL) 400 MG tablet      Take 4.5 tablets (1,800 mg total) by mouth 3 (three) times a week.    Take 4.5 tablets (1,800 mg total) by mouth 3 (three) times a week.   RIFAMPIN (RIFADIN) 300 MG CAPSULE rifampin (RIFADIN) 300 MG capsule      Take 2 capsules (600 mg total) by mouth 3 (three) times a week.    Take 2 capsules (600 mg total) by mouth 3 (three) times a week.  Discontinued Medications   No medications on file     Subjective: [name] is in for his routine follow-up visit.  He started on azithromycin, ethambutol and rifampin on 11/30/2021 for his smoldering Mycobacterium avium pneumonia.  His sputum AFB smear and culture were negative the day he started therapy and again in late July but was positive again in November.    He is tolerating his antibiotics well and never misses doses.  His appetite is good and he i has gained weight.  He is noted significant improvement with decreased cough, decreased sputum production and decreased dyspnea on exertion.  I ordered a chest x-ray last month but he did not understand how to follow through on that.  Repeat AFB smears and cultures from 08/10/2022 were both negative so far.  Review of Systems: Review of Systems  Constitutional:  Negative for fever and weight loss.  Respiratory:  Positive for cough, sputum production and shortness of breath. Negative for hemoptysis.   Cardiovascular:  Negative for chest pain.    Past Medical History:  Diagnosis Date   Adenoma 2008   serrated adenoma of colon   Diverticulosis    GERD (gastroesophageal reflux disease)    H. Pylori related-gone when treated  Hyperlipidemia    Prostate cancer [facility]) 1994   radical prostatectomy Dr. Gentry Fitz   Right inguinal hernia    Seasonal allergies    Sinusitis acute     Social History   Tobacco Use   Smoking status: Never   Smokeless tobacco: Never  Vaping Use   Vaping Use: Never used  Substance Use Topics   Alcohol use: Yes    Alcohol/week: 7.0 standard drinks of alcohol    Types: 7 Standard drinks or equivalent per week    Comment: glass wine with dinner   Drug use: Yes    Types: Amyl nitrate, IV    Family History  Problem Relation Age of Onset   Prostate cancer Paternal Uncle    Prostate cancer Cousin    Pneumonia Father        aspiration   Other Other 70       smoker. sudden death unclear cause   Colon cancer Neg Hx     No Known  Allergies  Objective: Vitals:   09/20/22 1411 09/20/22 1432  BP: (!) 153/91 (!) 137/92  Pulse: 75 77  Temp: 97.6 F (36.4 C)   TempSrc: Oral   SpO2: 100%   Weight: 159 lb (72.1 kg)   Height: 6\' 1"  (1.854 m)    Body mass index is 20.98 kg/m.  Physical Exam Constitutional:      Comments: His spirits are good as usual.  Cardiovascular:     Rate and Rhythm: Normal rate.  Pulmonary:     Effort: Pulmonary effort is normal.  Psychiatric:        Mood and Affect: Mood normal.       Problem List Items Addressed This Visit       High   Mycobacterium avium infection ([place]) - Primary    Other than the positive AFB culture last November he has had a good clinical and microbiologic response to treatment for Mycobacterium avium pneumonia.  He will continue his current 3 drug regimen.  Will reorder the chest x-ray today and have him follow-up in 3 months.      Relevant Medications   azithromycin (ZITHROMAX) 500 MG tablet (Start on 09/21/2022)   ethambutol (MYAMBUTOL) 400 MG tablet (Start on 09/21/2022)   rifampin (RIFADIN) 300 MG capsule (Start on 09/21/2022)   Other Relevant Orders   CBC   Comprehensive metabolic panel   DG Chest 2 View    Michel Bickers, MD [facility] for [place] [phone] pager   [phone] cell 09/20/2022, 2:34 PM

## 2022-09-20 NOTE — Assessment & Plan Note (Signed)
Other than the positive AFB culture last November he has had a good clinical and microbiologic response to treatment for Mycobacterium avium pneumonia.  He will continue his current 3 drug regimen.  Will reorder the chest x-ray today and have him follow-up in 3 months.

## 2022-09-20 NOTE — Telephone Encounter (Signed)
Spoke with patient and scheduled for lab visit only on 3/28 @ 2pm.

## 2022-09-22 ENCOUNTER — Other Ambulatory Visit: Payer: PPO

## 2022-09-22 ENCOUNTER — Other Ambulatory Visit: Payer: Self-pay

## 2022-09-22 DIAGNOSIS — A310 Pulmonary mycobacterial infection: Secondary | ICD-10-CM | POA: Diagnosis not present

## 2022-09-23 LAB — COMPREHENSIVE METABOLIC PANEL
AG Ratio: 1.5 (calc) (ref 1.0–2.5)
ALT: 19 U/L (ref 9–46)
AST: 22 U/L (ref 10–35)
Albumin: 4.1 g/dL (ref 3.6–5.1)
Alkaline phosphatase (APISO): 57 U/L (ref 35–144)
BUN: 19 mg/dL (ref 7–25)
CO2: 27 mmol/L (ref 20–32)
Calcium: 8.7 mg/dL (ref 8.6–10.3)
Chloride: 101 mmol/L (ref 98–110)
Creat: 0.9 mg/dL (ref 0.70–1.22)
Globulin: 2.7 g/dL (calc) (ref 1.9–3.7)
Glucose, Bld: 71 mg/dL (ref 65–99)
Potassium: 4.5 mmol/L (ref 3.5–5.3)
Sodium: 137 mmol/L (ref 135–146)
Total Bilirubin: 1.2 mg/dL (ref 0.2–1.2)
Total Protein: 6.8 g/dL (ref 6.1–8.1)

## 2022-09-23 LAB — MYCOBACTERIA,CULT W/FLUOROCHROME SMEAR
MICRO NUMBER:: 14562016
SMEAR:: NONE SEEN
SPECIMEN QUALITY:: ADEQUATE

## 2022-09-23 LAB — CBC WITH DIFFERENTIAL/PLATELET
Absolute Monocytes: 578 cells/uL (ref 200–950)
Basophils Absolute: 71 cells/uL (ref 0–200)
Basophils Relative: 1.2 %
Eosinophils Absolute: 142 cells/uL (ref 15–500)
Eosinophils Relative: 2.4 %
HCT: 41.7 % (ref 38.5–50.0)
Hemoglobin: 14.7 g/dL (ref 13.2–17.1)
Lymphs Abs: 2319 cells/uL (ref 850–3900)
MCH: 31.3 pg (ref 27.0–33.0)
MCHC: 35.3 g/dL (ref 32.0–36.0)
MCV: 88.9 fL (ref 80.0–100.0)
MPV: 9.5 fL (ref 7.5–12.5)
Monocytes Relative: 9.8 %
Neutro Abs: 2791 cells/uL (ref 1500–7800)
Neutrophils Relative %: 47.3 %
Platelets: 187 10*3/uL (ref 140–400)
RBC: 4.69 10*6/uL (ref 4.20–5.80)
RDW: 12.8 % (ref 11.0–15.0)
Total Lymphocyte: 39.3 %
WBC: 5.9 10*3/uL (ref 3.8–10.8)

## 2022-09-25 ENCOUNTER — Encounter: Payer: Self-pay | Admitting: Family Medicine

## 2022-09-26 ENCOUNTER — Other Ambulatory Visit: Payer: Self-pay

## 2022-09-26 ENCOUNTER — Other Ambulatory Visit (HOSPITAL_COMMUNITY): Payer: Self-pay

## 2022-09-26 ENCOUNTER — Other Ambulatory Visit: Payer: Self-pay | Admitting: Family Medicine

## 2022-09-26 DIAGNOSIS — R9721 Rising PSA following treatment for malignant neoplasm of prostate: Secondary | ICD-10-CM | POA: Diagnosis not present

## 2022-09-26 MED ORDER — VALSARTAN 80 MG PO TABS
80.0000 mg | ORAL_TABLET | Freq: Two times a day (BID) | ORAL | 5 refills | Status: DC
  Filled 2022-09-26: qty 60, 30d supply, fill #0
  Filled 2022-10-21: qty 60, 30d supply, fill #1
  Filled 2022-11-20: qty 60, 30d supply, fill #2
  Filled 2022-12-24: qty 60, 30d supply, fill #3
  Filled 2023-01-24: qty 60, 30d supply, fill #4
  Filled 2023-02-21: qty 60, 30d supply, fill #5

## 2022-10-03 DIAGNOSIS — C61 Malignant neoplasm of prostate: Secondary | ICD-10-CM | POA: Diagnosis not present

## 2022-10-25 ENCOUNTER — Encounter: Payer: Self-pay | Admitting: Family Medicine

## 2022-10-26 ENCOUNTER — Other Ambulatory Visit (HOSPITAL_COMMUNITY): Payer: Self-pay

## 2022-10-27 ENCOUNTER — Encounter: Payer: Self-pay | Admitting: Family Medicine

## 2022-10-27 ENCOUNTER — Ambulatory Visit (INDEPENDENT_AMBULATORY_CARE_PROVIDER_SITE_OTHER): Payer: PPO | Admitting: Family Medicine

## 2022-10-27 VITALS — BP 116/70 | HR 78 | Temp 97.8°F | Ht 73.0 in | Wt 158.0 lb

## 2022-10-27 DIAGNOSIS — C61 Malignant neoplasm of prostate: Secondary | ICD-10-CM | POA: Diagnosis not present

## 2022-10-27 DIAGNOSIS — R911 Solitary pulmonary nodule: Secondary | ICD-10-CM | POA: Diagnosis not present

## 2022-10-27 DIAGNOSIS — I158 Other secondary hypertension: Secondary | ICD-10-CM

## 2022-10-27 NOTE — Progress Notes (Addendum)
Phone [phone] In person visit   Subjective:   Juan Lin is a 82 y.o. 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 30 tablet 11   cetirizine (ZYRTEC) 10 MG tablet Take 10 mg by mouth daily.     enzalutamide (XTANDI) 40 MG capsule      ethambutol (MYAMBUTOL) 400 MG tablet Take 4.5 tablets (1,800 mg total) by mouth 3 (three) times a week. 162 tablet 11   MELATONIN PO Take by mouth as needed.     Multiple Vitamin (MULTIVITAMIN WITH MINERALS) TABS tablet Take 1 tablet by mouth daily.     naproxen sodium (ALEVE) 220 MG tablet Take 220 mg by mouth as needed.     Omega-3 Fatty Acids (FISH OIL) 1000 MG CAPS Take by mouth.     rifampin (RIFADIN) 300 MG capsule Take 2 capsules (600 mg total) by mouth 3 (three) times a week. 60 capsule 11   valsartan (DIOVAN) 80 MG tablet Take 1 tablet (80 mg total) by mouth 2 (two) times daily. 60 tablet 5   guaiFENesin (MUCINEX) 600 MG 12 hr tablet Take by mouth 2 (two) times daily as needed. (Patient not taking: Reported on 06/13/2022)     No  current facility-administered medications for this visit.     Objective:  BP 116/70   Pulse 78   Temp 97.8 F (36.6 C)   Ht 6\' 1"  (1.854 m)   Wt 158 lb (71.7 kg)   SpO2 98%   BMI 20.85 kg/m  Gen: NAD, resting comfortably CV: RRR no murmurs rubs or gallops Lungs: CTAB no crackles, wheeze, rhonchi Ext: no edema Skin: warm, dry     Assessment and Plan   #Prostate cancer- on xtandi and now up to 2 tablets per day (original plan had been to 4 tablets a day but blood pressure was substantially elevated) -does have some mild nipple tenderness reported and on exam has some gynecomastia. He would prefer to remain on this same dose if possibel if adequately controlling the prostate cancer-PSA did come down from nearly 30 to 2.7.  -For now we will continue current medication at the request reach out to Dr. Mena Goes for his opinion goal to titrate dose  Addendum 10/28/22: spoke with Dr. Mena Goes - we will maintain at 2 capsules xtandi given excellent PSA response and gynecomastia  #hypertension S: medication: valsartan 80 mg twice daily  Home readings #s: Some variability but typically controlled BP Readings from Last 3 Encounters:  10/27/22 116/70  09/20/22 (!) 137/92  09/16/22 118/82  A/P: Blood pressure looks excellent-continue current medication-can titrate if needed if we increase the Xtandi and blood pressure increasess   # Pulmonary nodule-noted on most recent chest x-ray 09/20/2022 as 6 mm right midlung nodular opacity and recommended follow-up chest CT-I am aware that at this time  Recommended follow up: Return in about 2 months (around 12/27/2022) for followup or sooner if needed.Schedule b4 you leave. Future Appointments  Date Time Provider Department Center  12/21/2022  2:30 PM Daiva Eves, Lisette Grinder, MD [facility]  12/27/2022  3:00 PM Shelva Majestic, MD [facility]  06/19/2023  2:00 PM [facility] ANNUAL WELLNESS VISIT 1 [facility]    Lab/Order associations:    ICD-10-CM   1. Prostate cancer (HCC)  C61     2. Other secondary hypertension  I15.8     3. Pulmonary nodule  R91.1 CT Chest Wo Contrast     Return precautions advised.  Tana Conch, MD

## 2022-10-27 NOTE — Patient Instructions (Addendum)
I am going to reach out to urology but hold steady for xtandi (2 per day) and valsartan (2 per day)   Radiology will call you within two weeks about your referral for chest CT through [facility].  Their phone number is [phone].  Please call them if you have not heard in 2 weeks   Recommended follow up: Return in about 2 months (around 12/27/2022) for followup or sooner if needed.Schedule b4 you leave. -also consider going ahead and scheduling next available physical

## 2022-10-28 ENCOUNTER — Other Ambulatory Visit (HOSPITAL_COMMUNITY): Payer: Self-pay

## 2022-11-16 DIAGNOSIS — C4441 Basal cell carcinoma of skin of scalp and neck: Secondary | ICD-10-CM | POA: Diagnosis not present

## 2022-11-16 DIAGNOSIS — C44519 Basal cell carcinoma of skin of other part of trunk: Secondary | ICD-10-CM | POA: Diagnosis not present

## 2022-11-16 DIAGNOSIS — D225 Melanocytic nevi of trunk: Secondary | ICD-10-CM | POA: Diagnosis not present

## 2022-11-16 DIAGNOSIS — D485 Neoplasm of uncertain behavior of skin: Secondary | ICD-10-CM | POA: Diagnosis not present

## 2022-11-16 DIAGNOSIS — L570 Actinic keratosis: Secondary | ICD-10-CM | POA: Diagnosis not present

## 2022-11-16 DIAGNOSIS — Z85828 Personal history of other malignant neoplasm of skin: Secondary | ICD-10-CM | POA: Diagnosis not present

## 2022-11-16 DIAGNOSIS — L821 Other seborrheic keratosis: Secondary | ICD-10-CM | POA: Diagnosis not present

## 2022-11-17 ENCOUNTER — Encounter: Payer: Self-pay | Admitting: Family Medicine

## 2022-11-22 ENCOUNTER — Other Ambulatory Visit (HOSPITAL_COMMUNITY): Payer: Self-pay

## 2022-11-29 ENCOUNTER — Ambulatory Visit (INDEPENDENT_AMBULATORY_CARE_PROVIDER_SITE_OTHER): Payer: PPO | Admitting: Family Medicine

## 2022-11-29 ENCOUNTER — Telehealth: Payer: Self-pay | Admitting: Family Medicine

## 2022-11-29 ENCOUNTER — Encounter: Payer: Self-pay | Admitting: Family Medicine

## 2022-11-29 VITALS — BP 116/80 | HR 74 | Temp 97.4°F | Ht 73.0 in | Wt 156.0 lb

## 2022-11-29 DIAGNOSIS — C61 Malignant neoplasm of prostate: Secondary | ICD-10-CM

## 2022-11-29 DIAGNOSIS — I158 Other secondary hypertension: Secondary | ICD-10-CM

## 2022-11-29 DIAGNOSIS — I8000 Phlebitis and thrombophlebitis of superficial vessels of unspecified lower extremity: Secondary | ICD-10-CM

## 2022-11-29 NOTE — Progress Notes (Signed)
Phone [phone] In person visit   Subjective:   Juan Lin is a 82 y.o. year old very pleasant male patient who presents for/with See problem oriented charting Chief Complaint  Patient presents with   superficial thrombophlebitis    See mychart    Past Medical History-  Patient Active Problem List   Diagnosis Date Noted   Other secondary hypertension 11/29/2022    Priority: High   Prostate cancer [facility])     Priority: High   Elevated TSH 06/02/2014    Priority: Medium    Hyperlipidemia 03/10/2009    Priority: Medium    History of colon polyps     Priority: Low   Basal cell cancer 04/08/2013    Priority: Low   Allergic rhinitis 08/06/2007    Priority: Low   H/O radical prostatectomy 08/18/2021   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 30 tablet 11   cetirizine (ZYRTEC) 10 MG tablet Take 10 mg by mouth daily.     enzalutamide (XTANDI) 40 MG capsule      ethambutol (MYAMBUTOL) 400 MG tablet Take 4.5 tablets (1,800 mg total) by mouth 3 (three) times a week. 162 tablet 11   MELATONIN PO Take by mouth as needed.     Multiple Vitamin (MULTIVITAMIN WITH MINERALS) TABS tablet Take 1 tablet by mouth daily.     naproxen sodium (ALEVE) 220 MG tablet Take 220 mg by mouth as needed.     Omega-3 Fatty Acids (FISH OIL) 1000 MG CAPS Take by mouth.     rifampin (RIFADIN) 300 MG capsule Take 2 capsules (600 mg total) by mouth 3 (three) times a week. 60 capsule 11   valsartan (DIOVAN) 80 MG tablet Take 1 tablet (80 mg total) by mouth 2 (two) times daily. 60 tablet 5   guaiFENesin (MUCINEX) 600 MG 12 hr tablet Take by mouth 2 (two) times daily as needed.  (Patient not taking: Reported on 11/29/2022)     No current facility-administered medications for this visit.     Objective:  BP 116/80   Pulse 74   Temp (!) 97.4 F (36.3 C)   Ht 6\' 1"  (1.854 m)   Wt 156 lb (70.8 kg)   SpO2 95%   BMI 20.58 kg/m  Gen: NAD, resting comfortably CV: RRR no murmurs rubs or gallops Lungs: CTAB no crackles, wheeze, rhonchi Ext: no edema but with significant varicose veins on left greater than right.  Along GSV has a 2 x 3 cm raised hardened area that is nontender Skin: warm, dry     Assessment and Plan   # Superficial thrombophlebitis S: Patient noted in her left calf pain along varicose veins-noted a hardened area starting around mid May. - Did some research online which suggested would resolve within a few weeks with suggestions to try elevating the leg, applying heat, taking NSAIDs and wearing compression hose which he tried-pain resolved but hardness persisted -Saw dermatology who recommended he have a visit with primary care A/P: Patient with hardened area over GSV in the lower leg concerning for superficial thrombophlebitis-she is no longer having pain but with this area being 3 x 2 cm and the fact that he  is dealing with prostate cancer-I felt the need to be more cautious and order a venous duplex to make sure there is not further extension of clot I also briefly considered referral to the DVT clinic although once again there is no clear DVT here until further evaluation  #hypertension S: medication: Valsartan 160 mg total per day-80 mg twice daily BP Readings from Last 3 Encounters:  11/29/22 116/80  10/27/22 116/70  09/20/22 (!) 137/92  A/P: Excellent control-continue current medication  # Prostate cancer-significant improvement in PSA reported with starting Xtandi suggesting improvement/slowing of prostate cancer but certainly not full remission-he does have some gynecomastia with this but we have discussed with urology and we feel the  benefits outweigh the risks-at the same time do not feel strongly about increasing the dose of Xtandi 40 mg beyond the 2 tablets he is currently on-he asks about potentially increasing the dose but I defer this until upcoming discussion with urology but I am happy to help with blood pressure management if they do increase the dose and blood pressure increases   Recommended follow up: Return for as needed for new, worsening, persistent symptoms. Future Appointments  Date Time Provider Department Center  12/01/2022  2:00 PM [facility] 1 [facility]  12/21/2022  2:30 PM Daiva Eves, Lisette Grinder, MD [facility]  12/27/2022  3:00 PM Shelva Majestic, MD [facility]  06/19/2023  2:00 PM [facility] ANNUAL WELLNESS VISIT 1 [facility]    Lab/Order associations:   ICD-10-CM   1. Thrombophlebitis of superficial vein of lower leg  I80.00 VAS [place] LOWER EXTREMITY VENOUS (DVT)    2. Prostate cancer (HCC)  C61     3. Other secondary hypertension  I15.8       No orders of the defined types were placed in this encounter.   Return precautions advised.  Tana Conch, MD

## 2022-11-29 NOTE — Telephone Encounter (Signed)
Left voicemail to inform patient about appointment at [facility] for DVT @ 11:30am tomorrow morning.  Please advise.

## 2022-11-29 NOTE — Patient Instructions (Addendum)
Just to be on the safe side lets get an ultrasound of the lower leg to make sure no deeper extension. With your prostate cancer history I just want to make sure- very small chance you may need a blood thinner like eliquis or xarelto  Team please send this to referral point person and let him know whether he needs to wait or not   Recommended follow up: Return for as needed for new, worsening, persistent symptoms.

## 2022-11-30 ENCOUNTER — Ambulatory Visit (HOSPITAL_COMMUNITY)
Admission: RE | Admit: 2022-11-30 | Discharge: 2022-11-30 | Disposition: A | Discharge status: Home / Self Care | Payer: PPO | Source: Ambulatory Visit | Attending: Cardiovascular Disease | Admitting: Cardiovascular Disease

## 2022-11-30 DIAGNOSIS — I8000 Phlebitis and thrombophlebitis of superficial vessels of unspecified lower extremity: Secondary | ICD-10-CM | POA: Diagnosis not present

## 2022-11-30 DIAGNOSIS — I8002 Phlebitis and thrombophlebitis of superficial vessels of left lower extremity: Secondary | ICD-10-CM | POA: Diagnosis not present

## 2022-11-30 NOTE — Addendum Note (Signed)
Addended by: Shelva Majestic on: 11/30/2022 04:20 PM   Modules accepted: Orders

## 2022-12-01 ENCOUNTER — Ambulatory Visit
Admission: RE | Admit: 2022-12-01 | Discharge: 2022-12-01 | Disposition: A | Discharge status: Home / Self Care | Payer: PPO | Source: Ambulatory Visit | Attending: Family Medicine | Admitting: Family Medicine

## 2022-12-01 DIAGNOSIS — R918 Other nonspecific abnormal finding of lung field: Secondary | ICD-10-CM | POA: Diagnosis not present

## 2022-12-01 DIAGNOSIS — R911 Solitary pulmonary nodule: Secondary | ICD-10-CM

## 2022-12-01 DIAGNOSIS — I7121 Aneurysm of the ascending aorta, without rupture: Secondary | ICD-10-CM | POA: Diagnosis not present

## 2022-12-01 DIAGNOSIS — J479 Bronchiectasis, uncomplicated: Secondary | ICD-10-CM | POA: Diagnosis not present

## 2022-12-09 ENCOUNTER — Encounter: Payer: Self-pay | Admitting: Family Medicine

## 2022-12-09 DIAGNOSIS — I700 Atherosclerosis of aorta: Secondary | ICD-10-CM | POA: Insufficient documentation

## 2022-12-21 ENCOUNTER — Other Ambulatory Visit (HOSPITAL_COMMUNITY): Payer: Self-pay

## 2022-12-21 ENCOUNTER — Encounter: Payer: Self-pay | Admitting: Infectious Disease

## 2022-12-21 ENCOUNTER — Ambulatory Visit (INDEPENDENT_AMBULATORY_CARE_PROVIDER_SITE_OTHER): Payer: PPO | Admitting: Infectious Disease

## 2022-12-21 ENCOUNTER — Other Ambulatory Visit: Payer: Self-pay

## 2022-12-21 VITALS — BP 113/77 | HR 83 | Temp 97.4°F | Ht 73.0 in | Wt 155.0 lb

## 2022-12-21 DIAGNOSIS — A310 Pulmonary mycobacterial infection: Secondary | ICD-10-CM

## 2022-12-21 DIAGNOSIS — C61 Malignant neoplasm of prostate: Secondary | ICD-10-CM | POA: Diagnosis not present

## 2022-12-21 MED ORDER — ETHAMBUTOL HCL 400 MG PO TABS
1800.0000 mg | ORAL_TABLET | ORAL | 11 refills | Status: DC
Start: 2022-12-21 — End: 2023-06-19
  Filled 2022-12-21 – 2023-01-12 (×2): qty 162, 84d supply, fill #0
  Filled 2023-04-01: qty 162, 84d supply, fill #1

## 2022-12-21 MED ORDER — RIFAMPIN 300 MG PO CAPS
600.0000 mg | ORAL_CAPSULE | ORAL | 11 refills | Status: DC
Start: 2022-12-21 — End: 2023-06-19
  Filled 2022-12-21: qty 60, 70d supply, fill #0
  Filled 2023-03-13: qty 60, 70d supply, fill #1

## 2022-12-21 MED ORDER — AZITHROMYCIN 500 MG PO TABS
500.0000 mg | ORAL_TABLET | ORAL | 11 refills | Status: DC
Start: 2022-12-21 — End: 2023-06-19
  Filled 2022-12-21: qty 30, 70d supply, fill #0
  Filled 2023-03-13: qty 30, 70d supply, fill #1

## 2022-12-21 NOTE — Progress Notes (Signed)
Subjective:  Chief complaint follow-up for Mycobacterium avium infection    Patient ID: Juan Lin, male    DOB: Mar 22, 1941, 82 y.o.   MRN: [id number]  HPI  Juan Lin is a very nice 82 year old with hx of prostate cancer sp facetectomy but with recurrence currently on Xtandi who developed M avium infection of the lungs--without antecedent smoking, COPD, emphysema or actual lung disease but rather in the form of "lady [place]'s" He is actually the first male patient I have had with [employer].  He has been doing well on his 3 times a week regimen of ethambutol rifampin and azithromycin.  Cough is minimal and weight has improved.  Most recent CT of the lungs also shows improvement in his pulmonary nodules.  His last AFB culture in February was negative after a +1 in November 2023.    Past Medical History:  Diagnosis Date   Adenoma 2008   serrated adenoma of colon   Diverticulosis    GERD (gastroesophageal reflux disease)    H. Pylori related-gone when treated   Hyperlipidemia    Prostate cancer [facility]) 1994   radical prostatectomy Dr. Richardo Priest   Right inguinal hernia    Seasonal allergies    Sinusitis acute     Past Surgical History:  Procedure Laterality Date   APPENDECTOMY     CATARACT EXTRACTION, BILATERAL Bilateral 2018   CHOLECYSTECTOMY  05/2009   INGUINAL HERNIA REPAIR Right 04/13/2020   Procedure: OPEN RIGHT INGUINAL HERNIA REPAIR;  Surgeon: Fritzi Mandes, MD;  Location: [facility];  Service: General;  Laterality: Right;   prostatectomy  1994   DR[facility]   TONSILLECTOMY      Family History  Problem Relation Age of Onset   Prostate cancer Paternal Uncle    Prostate cancer Cousin    Pneumonia Father        aspiration   Other Other 6       smoker. sudden death unclear cause   Colon cancer Neg Hx       Social History   Socioeconomic History   Marital status: Married    Spouse name: Not on file   Number of children: Not  on file   Years of education: Not on file   Highest education level: Doctorate  Occupational History    Comment: currently volunteers as musician  Tobacco Use   Smoking status: Never   Smokeless tobacco: Never  Vaping Use   Vaping Use: Never used  Substance and Sexual Activity   Alcohol use: Yes    Alcohol/week: 7.0 standard drinks of alcohol    Types: 7 Standard drinks or equivalent per week    Comment: glass wine with dinner   Drug use: Not Currently    Types: Amyl nitrate, IV   Sexual activity: Not on file  Other Topics Concern   Not on file  Social History Narrative   From [place]    Came down to [facility] for graduate studies- Phd in botany.    Music career as "Scientist, forensic" career- Dispensing optician. Plays.    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       Married 1985.1st marriage. No kids. No pets.       Hobbies- above + gardening      Social Determinants of Health   Financial Resource Strain: Low Risk  (10/25/2022)   Overall Financial Resource Strain (CARDIA)  Difficulty of Paying Living Expenses: Not hard at all  Food Insecurity: No Food Insecurity (10/25/2022)   Hunger Vital Sign    Worried About Running Out of Food in the Last Year: Never true    Ran Out of Food in the Last Year: Never true  Transportation Needs: No Transportation Needs (10/25/2022)   PRAPARE - Administrator, Civil Service (Medical): No    Lack of Transportation (Non-Medical): No  Physical Activity: Sufficiently Active (10/25/2022)   Exercise Vital Sign    Days of Exercise per Week: 4 days    Minutes of Exercise per Session: 50 min  Stress: No Stress Concern Present (10/25/2022)   [employer] of Occupational Health - Occupational Stress Questionnaire    Feeling of Stress : Only a little  Social Connections: Moderately Integrated (10/25/2022)   Social Connection and Isolation Panel [NHANES]    Frequency of  Communication with Friends and Family: Never    Frequency of Social Gatherings with Friends and Family: Once a week    Attends Religious Services: More than 4 times per year    Active Member of [employer] or Organizations: Yes    Attends Engineer, structural: More than 4 times per year    Marital Status: Married    No Known Allergies   Current Outpatient Medications:    cetirizine (ZYRTEC) 10 MG tablet, Take 10 mg by mouth daily., Disp: , Rfl:    enzalutamide (XTANDI) 40 MG capsule, , Disp: , Rfl:    guaiFENesin (MUCINEX) 600 MG 12 hr tablet, Take by mouth 2 (two) times daily as needed., Disp: , Rfl:    Multiple Vitamin (MULTIVITAMIN WITH MINERALS) TABS tablet, Take 1 tablet by mouth daily., Disp: , Rfl:    naproxen sodium (ALEVE) 220 MG tablet, Take 220 mg by mouth as needed., Disp: , Rfl:    Omega-3 Fatty Acids (FISH OIL) 1000 MG CAPS, Take by mouth., Disp: , Rfl:    valsartan (DIOVAN) 80 MG tablet, Take 1 tablet (80 mg total) by mouth 2 (two) times daily., Disp: 60 tablet, Rfl: 5   azithromycin (ZITHROMAX) 500 MG tablet, Take 1 tablet (500 mg total) by mouth 3 (three) times a week., Disp: 30 tablet, Rfl: 11   ethambutol (MYAMBUTOL) 400 MG tablet, Take 4.5 tablets (1,800 mg total) by mouth 3 (three) times a week., Disp: 162 tablet, Rfl: 11   MELATONIN PO, Take by mouth as needed. (Patient not taking: Reported on 12/21/2022), Disp: , Rfl:    rifampin (RIFADIN) 300 MG capsule, Take 2 capsules (600 mg total) by mouth 3 (three) times a week., Disp: 60 capsule, Rfl: 11    Review of Systems  Constitutional:  Negative for activity change, appetite change, chills, diaphoresis, fatigue, fever and unexpected weight change.  HENT:  Negative for congestion, rhinorrhea, sinus pressure, sneezing, sore throat and trouble swallowing.   Eyes:  Negative for photophobia and visual disturbance.  Respiratory:  Negative for cough, chest tightness, shortness of breath, wheezing and stridor.    Cardiovascular:  Negative for chest pain, palpitations and leg swelling.  Gastrointestinal:  Negative for abdominal distention, abdominal pain, anal bleeding, blood in stool, constipation, diarrhea, nausea and vomiting.  Genitourinary:  Negative for difficulty urinating, dysuria, flank pain and hematuria.  Musculoskeletal:  Negative for arthralgias, back pain, gait problem, joint swelling and myalgias.  Skin:  Negative for color change, pallor, rash and wound.  Neurological:  Negative for dizziness, tremors, weakness and light-headedness.  Hematological:  Negative for  adenopathy. Does not bruise/bleed easily.  Psychiatric/Behavioral:  Negative for agitation, behavioral problems, confusion, decreased concentration, dysphoric mood and sleep disturbance.        Objective:   Physical Exam Constitutional:      Appearance: He is well-developed.  HENT:     Head: Normocephalic and atraumatic.  Eyes:     Conjunctiva/sclera: Conjunctivae normal.  Cardiovascular:     Rate and Rhythm: Normal rate and regular rhythm.     Heart sounds: No murmur heard.    No friction rub. No gallop.  Pulmonary:     Effort: Pulmonary effort is normal. No respiratory distress.     Breath sounds: No stridor. No wheezing or rhonchi.  Abdominal:     General: There is no distension.     Palpations: Abdomen is soft.  Musculoskeletal:        General: No tenderness. Normal range of motion.     Cervical back: Normal range of motion and neck supple.  Skin:    General: Skin is warm and dry.     Coloration: Skin is not pale.     Findings: No erythema or rash.  Neurological:     General: No focal deficit present.     Mental Status: He is alert and oriented to person, place, and time.  Psychiatric:        Mood and Affect: Mood normal.        Behavior: Behavior normal.        Thought Content: Thought content normal.        Judgment: Judgment normal.           Assessment & Plan:   M avium infection of the  lungs:  Continue 3 times weekly regimen.  Will aim to get him into February and then have him submit another sputum culture for AFB stain and culture.  If that 1 is negative and he is still feeling well he will of the filled ATS guidelines for consideration of stopping therapy.  I have informed him and wife that he will very likely experience recurrence of his mycobacterial infection as these infections typically are not curable but lifelong conditions.  Prostate cancer: Under good control under Xtandi even at reduced dose and even with the drug interaction with rifampin  I have personally spent 42 minutes involved in face-to-face and non-face-to-face activities for this patient on the day of the visit. Professional time spent includes the following activities: Preparing to see the patient (review of tests), Obtaining and/or reviewing separately obtained history (admission/discharge record), Performing a medically appropriate examination and/or evaluation , Ordering medications/tests/procedures, referring and communicating with other health care professionals, Documenting clinical information in the EMR, Independently interpreting results (not separately reported), Communicating results to the patient/family/caregiver, Counseling and educating the patient/family/caregiver and Care coordination (not separately reported).

## 2022-12-27 ENCOUNTER — Ambulatory Visit (INDEPENDENT_AMBULATORY_CARE_PROVIDER_SITE_OTHER): Payer: PPO | Admitting: Family Medicine

## 2022-12-27 ENCOUNTER — Telehealth: Payer: Self-pay | Admitting: Family Medicine

## 2022-12-27 ENCOUNTER — Encounter: Payer: Self-pay | Admitting: Family Medicine

## 2022-12-27 VITALS — BP 124/74 | HR 86 | Temp 97.9°F | Ht 73.0 in | Wt 154.6 lb

## 2022-12-27 DIAGNOSIS — E785 Hyperlipidemia, unspecified: Secondary | ICD-10-CM

## 2022-12-27 DIAGNOSIS — I82502 Chronic embolism and thrombosis of unspecified deep veins of left lower extremity: Secondary | ICD-10-CM

## 2022-12-27 DIAGNOSIS — I700 Atherosclerosis of aorta: Secondary | ICD-10-CM | POA: Diagnosis not present

## 2022-12-27 DIAGNOSIS — I158 Other secondary hypertension: Secondary | ICD-10-CM | POA: Diagnosis not present

## 2022-12-27 NOTE — Telephone Encounter (Signed)
Patient requesting to have labs preformed a week prior to physical appt . No future labs ordered yet , please advise .

## 2022-12-27 NOTE — Telephone Encounter (Signed)
CBC with differential, CMP, lipid panel, TSH under hyperlipidemia unspecified please - We did not specifically discuss this at the visit-if there are other labs that he wants please let me know and can reconsider

## 2022-12-27 NOTE — Patient Instructions (Addendum)
We considered doing blood thinners or referral to hematology but opted out for now unless recurrent or worsening issues  Blood pressure looks great!   Recommended follow up: Return in about 4 months (around 04/29/2023) for physical or sooner if needed.Schedule b4 you leave.

## 2022-12-27 NOTE — Progress Notes (Signed)
Phone [phone] In person visit   Subjective:   Juan Lin is a 82 y.o. year old very pleasant male patient who presents for/with See problem oriented charting Chief Complaint  Patient presents with   thrombophlebitis    Pt is here for 2 month f/u on thrombophlebitis,he has a tender spot on the upper left calf that has become hard to the touch   Past Medical History-  Patient Active Problem List   Diagnosis Date Noted   Leg DVT (deep venous thromboembolism), chronic, left (HCC) 12/27/2022    Priority: High   Other secondary hypertension 11/29/2022    Priority: High   Prostate cancer [facility])     Priority: High   Elevated TSH 06/02/2014    Priority: Medium    Hyperlipidemia 03/10/2009    Priority: Medium    History of colon polyps     Priority: Low   Basal cell cancer 04/08/2013    Priority: Low   Allergic rhinitis 08/06/2007    Priority: Low   Aortic atherosclerosis (HCC) 12/09/2022   H/O radical prostatectomy 08/18/2021   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 30 tablet 11   cetirizine (ZYRTEC) 10 MG tablet Take 10 mg by mouth daily.     enzalutamide (XTANDI) 40 MG capsule      ethambutol (MYAMBUTOL) 400 MG tablet Take 4.5 tablets (1,800 mg total) by mouth 3 (three) times a week. 162 tablet 11   MELATONIN PO Take by mouth as needed.     Multiple Vitamin (MULTIVITAMIN WITH MINERALS) TABS tablet Take 1 tablet by mouth daily.     naproxen sodium (ALEVE) 220 MG tablet Take 220 mg by mouth as needed.     Omega-3 Fatty Acids (FISH OIL) 1000 MG CAPS Take by mouth.     rifampin (RIFADIN) 300 MG capsule Take 2 capsules (600 mg total) by mouth 3  (three) times a week. 60 capsule 11   valsartan (DIOVAN) 80 MG tablet Take 1 tablet (80 mg total) by mouth 2 (two) times daily. 60 tablet 5   guaiFENesin (MUCINEX) 600 MG 12 hr tablet Take by mouth 2 (two) times daily as needed. (Patient not taking: Reported on 12/27/2022)     No current facility-administered medications for this visit.     Objective:  BP 124/74   Pulse 86   Temp 97.9 F (36.6 C)   Ht 6\' 1"  (1.854 m)   Wt 154 lb 9.6 oz (70.1 kg)   SpO2 96%   BMI 20.40 kg/m  Gen: NAD, resting comfortably CV: RRR no murmurs rubs or gallops Lungs: CTAB no crackles, wheeze, rhonchi Abdomen: soft/nontender/nondistended/normal bowel sounds. No rebound or guarding.  Ext: no edema on right-trace on left-hardened area from prior superficial thrombophlebitis-above this has a separate area that is now hardened-not tender on exam    Assessment and Plan   #superficial thrombophlebitis and chronic DVT on left leg 11/30/22 S: Previously offered hematology/oncology referral given ongoing cancer for opinion on long term anticoagulant but he would prefer to monitor for worsening or new leg swelling, chest pain, shortness of breath and if those occur consider medicine/workup at that time over considering long term anticoagulant/blood thinner at  present- do not see acute need for treatment as these both appear chronic  Today patient reports he has a tender spot on the upper left calf that has become hard to touch.heat helped and now gone back down. Trying to wear compression stockings A/P: Known Chronic DVT and sounds like is now have 2 other superficial thrombophlebitis cases-discussed with ongoing cancer option of blood thinners or referral to hematology but he opted out for now unless recurrent or worsening issues  #hypertension S: medication: Valsartan 80 mg BP Readings from Last 3 Encounters:  12/27/22 124/74  12/21/22 113/77  11/29/22 116/80  A/P: Controlled. Continue current  medications.  #hyperlipidemia with aortic atherosclerosis S: Medication:none  Lab Results  Component Value Date   CHOL 169 02/09/2021   HDL 70.80 02/09/2021   LDLCALC 75 02/09/2021   TRIG 115.0 02/09/2021   CHOLHDL 2 02/09/2021   A/P: aortic atherosclerosis (presumed stable)- LDL goal ideally <70-just here above goal last check-he agrees to schedule physical in 4 months and come back to have labs repeated at that time - With his cancer treatment and age I do not feel strongly about being aggressive-reevaluate at next visit  Recommended follow up: Return in about 4 months (around 04/29/2023) for physical or sooner if needed.Schedule b4 you leave. Future Appointments  Date Time Provider Department Center  06/19/2023  2:00 PM [facility] ANNUAL WELLNESS VISIT 1 [facility]  07/17/2023  2:00 PM Shelva Majestic, MD [facility]  08/23/2023  2:30 PM Daiva Eves, Lisette Grinder, MD [facility]   Lab/Order associations:   ICD-10-CM   1. Other secondary hypertension  I15.8     2. Hyperlipidemia, unspecified hyperlipidemia type  E78.5     3. Aortic atherosclerosis (HCC) Chronic I70.0     4. Leg DVT (deep venous thromboembolism), chronic, left (HCC)  I82.502       No orders of the defined types were placed in this encounter.   Return precautions advised.  Tana Conch, MD

## 2022-12-27 NOTE — Telephone Encounter (Signed)
See below

## 2022-12-28 NOTE — Telephone Encounter (Signed)
Pt has been scheduled for 07/13/23 @ 10am. Pt has been instructed to fast for labs.

## 2022-12-28 NOTE — Telephone Encounter (Signed)
Orders placed, ok to schedule lab prior to CPE.

## 2022-12-30 DIAGNOSIS — C61 Malignant neoplasm of prostate: Secondary | ICD-10-CM | POA: Diagnosis not present

## 2023-01-05 DIAGNOSIS — C61 Malignant neoplasm of prostate: Secondary | ICD-10-CM | POA: Diagnosis not present

## 2023-01-12 ENCOUNTER — Other Ambulatory Visit: Payer: Self-pay

## 2023-01-24 ENCOUNTER — Other Ambulatory Visit: Payer: Self-pay

## 2023-01-25 ENCOUNTER — Ambulatory Visit (INDEPENDENT_AMBULATORY_CARE_PROVIDER_SITE_OTHER): Payer: PPO | Admitting: Family

## 2023-01-25 VITALS — BP 146/87 | Temp 97.3°F | Ht 73.0 in | Wt 156.8 lb

## 2023-01-25 DIAGNOSIS — M7989 Other specified soft tissue disorders: Secondary | ICD-10-CM | POA: Diagnosis not present

## 2023-01-25 NOTE — Patient Instructions (Signed)
It was very nice to see you today!   As discussed, wear the compression socks during the day. At night you can wear mild compression, but just having legs elevated is enough. On your long drive, take frequent breaks, every hour to walk around and get your blood circulating in your legs. While in the car, do the ankle/foot raises often. Ok to apply heat for up to 30 minutes several times per day. Elevate your leg whenever resting. Hydrate with 8 cups of water daily, starting in the morning. Eat low sodium diet, no added salt, no fast food, no packaged drinks (lemonade, gatorade, etc)  Any worsening of pain, swelling, or redness, you should go to the ER.      PLEASE NOTE:  If you had any lab tests please let us know if you have not heard back within a few days. You may see your results on MyChart before we have a chance to review them but we will give you a call once they are reviewed by [place]. If we ordered any referrals today, please let us know if you have not heard from their office within the next week.

## 2023-01-25 NOTE — Progress Notes (Signed)
Patient ID: Juan Lin, male    DOB: 21-Oct-1940, 82 y.o.   MRN: [id number]  Chief Complaint  Patient presents with   Leg Swelling    Pt c/o left leg swelling,tingling and redness, Has tried compression socks and elevating legs at night.    HPI:      Swollen leg: left leg  with non-pitting swelling and tingling, he denies pain. He is unsure how long he has had as his wife noticed and pointed it out to him.  Reports he has tingling normally, but seems more pronounced w/the swelling. He & wife dance each week and he is active everyday volunteering or doing things at home. Denies SOB, CP or tightness. He has had sx in recent past & seen by PCP and venous U/S indicates chronic thrombus, pt states he is not a candidate for anticoagulants d/t cancer tx . He & wife are leaving tomorrow, for a 2 day drive to go to do english country dancing up in [place] for 6 days and concerned about if he can do, also planned to go dancing this evening.      Assessment & Plan:  1. Left leg swelling- mild swelling noted in left calf compared to right. Hx of thrombophlebitis in leg, believe this is a chronic issue d/t known thrombosis. No erythema or warmth noted. Several firm varicose veins on medial side, just distal to knee. Mild tenderness with palpation. Advised pt to not go dancing this evening, elevate leg as much as possible this evening & continue wearing compression socks during the day. When driving he should do repeated ankle flexion/extension reps and stop every hour to walk around for a few minutes. He can continue to apply warm heat to leg up to tid and elevate leg whenever resting. Any worsening pain, swelling, numbness, redness or warmth he needs to seek an ER.   Subjective:    Outpatient Medications Prior to Visit  Medication Sig Dispense Refill   azithromycin (ZITHROMAX) 500 MG tablet Take 1 tablet (500 mg total) by mouth 3 (three) times a week. 30 tablet 11   cetirizine (ZYRTEC) 10  MG tablet Take 10 mg by mouth daily.     enzalutamide (XTANDI) 40 MG capsule      ethambutol (MYAMBUTOL) 400 MG tablet Take 4.5 tablets (1,800 mg total) by mouth 3 (three) times a week. 162 tablet 11   guaiFENesin (MUCINEX) 600 MG 12 hr tablet Take by mouth 2 (two) times daily as needed.     MELATONIN PO Take by mouth as needed.     Multiple Vitamin (MULTIVITAMIN WITH MINERALS) TABS tablet Take 1 tablet by mouth daily.     naproxen sodium (ALEVE) 220 MG tablet Take 220 mg by mouth as needed.     Omega-3 Fatty Acids (FISH OIL) 1000 MG CAPS Take by mouth.     rifampin (RIFADIN) 300 MG capsule Take 2 capsules (600 mg total) by mouth 3 (three) times a week. 60 capsule 11   valsartan (DIOVAN) 80 MG tablet Take 1 tablet (80 mg total) by mouth 2 (two) times daily. 60 tablet 5   No facility-administered medications prior to visit.   Past Medical History:  Diagnosis Date   Adenoma 2008   serrated adenoma of colon   Diverticulosis    GERD (gastroesophageal reflux disease)    H. Pylori related-gone when treated   Hyperlipidemia    Prostate cancer [facility]) 1994   radical prostatectomy Dr. Richardo Priest   Right  inguinal hernia    Seasonal allergies    Sinusitis acute    Past Surgical History:  Procedure Laterality Date   APPENDECTOMY     CATARACT EXTRACTION, BILATERAL Bilateral 2018   CHOLECYSTECTOMY  05/2009   INGUINAL HERNIA REPAIR Right 04/13/2020   Procedure: OPEN RIGHT INGUINAL HERNIA REPAIR;  Surgeon: Fritzi Mandes, MD;  Location:  SURGERY CENTER;  Service: General;  Laterality: Right;   prostatectomy  1994   [facility]   TONSILLECTOMY     No Known Allergies    Objective:    Physical Exam Vitals and nursing note reviewed.  Constitutional:      General: He is not in acute distress.    Appearance: Normal appearance.  HENT:     Head: Normocephalic.  Cardiovascular:     Rate and Rhythm: Normal rate and regular rhythm.     Pulses:          Posterior tibial pulses are  1+ on the left side.     Comments: left, LLE medial GSV thrombose in several areas, firm to palpation, no warmth or erythema noted Pulmonary:     Effort: Pulmonary effort is normal.     Breath sounds: Normal breath sounds.  Musculoskeletal:        General: Normal range of motion.     Cervical back: Normal range of motion.     Left lower leg: Edema (mild, non-pitting) present.       Legs:  Skin:    General: Skin is warm and dry.  Neurological:     Mental Status: He is alert and oriented to person, place, and time.  Psychiatric:        Mood and Affect: Mood normal.    BP (!) 152/85   Temp (!) 97.3 F (36.3 C) (Temporal)   Ht 6\' 1"  (1.854 m)   Wt 156 lb 12.8 oz (71.1 kg)   BMI 20.69 kg/m  Wt Readings from Last 3 Encounters:  01/25/23 156 lb 12.8 oz (71.1 kg)  12/27/22 154 lb 9.6 oz (70.1 kg)  12/21/22 155 lb (70.3 kg)       Dulce Sellar, NP

## 2023-02-13 ENCOUNTER — Encounter: Payer: Self-pay | Admitting: Family Medicine

## 2023-02-13 ENCOUNTER — Ambulatory Visit: Payer: PPO | Admitting: Family Medicine

## 2023-02-13 VITALS — BP 137/88 | HR 94 | Temp 97.7°F | Ht 73.0 in | Wt 152.0 lb

## 2023-02-13 DIAGNOSIS — I10 Essential (primary) hypertension: Secondary | ICD-10-CM | POA: Diagnosis not present

## 2023-02-13 DIAGNOSIS — I82502 Chronic embolism and thrombosis of unspecified deep veins of left lower extremity: Secondary | ICD-10-CM

## 2023-02-13 NOTE — Progress Notes (Signed)
   Juan Lin is a 82 y.o. male who presents today for an office visit.  Assessment/Plan:  New/Acute Problems: Left Leg Swelling / Chronic DVT Had lengthy discussion with patient and his spouse today regarding management options for his lower extremity edema and chronic DVT with superficial thrombosis with his upcoming flight and wanted.  We also discussed the anatomy and physiology of this clots and extremely low likelihood that this would cause PE or other serious complications.  We discussed conservative measures including compression stockings, leg elevation, and frequent ambulation.  Overall symptoms are stable and swelling has been improving the last couple of weeks.  He can use naproxen as needed for phlebitis pain though this does seem to be improving.  Also discussed importance of prevention of recurrent clot especially in light of upcoming flight.  Would be reasonable for him to take aspirin prior to flight.  His PCP has discussed referring him to oncology for discussion of anticoagulation however he has declined.  We discussed reasons to return to care or seek emergent care.   Chronic Problems Addressed Today: Essential hypertension At goal on valsartan 80 mg twice daily.  Had extensive discussion with patient regarding home blood pressure reading and importance of maintaining average less than 140/90.  They are working on lifestyle interventions as well.    Subjective:  HPI:  See A/P for status of chronic conditions.  Patient is here today for follow-up.  He was seen by NP 3 weeks ago for left leg swelling.  Overall symptoms been going on for couple months at this point.  His PCP did order an ultrasound 2 months ago which showedChronic DVT in the tibioperoneal confluence as well as great saphenous vein and varicosities.  His PCP at that time did recommend referral to hematology oncology to discuss long-term anticoagulation however he deferred at that point.    He does have an  upcoming flight to [facility] and would to discuss ways to prevent swelling from recurring while there.       Objective:  Physical Exam: BP 137/88   Pulse 94   Temp 97.7 F (36.5 C) (Temporal)   Ht 6\' 1"  (1.854 m)   Wt 152 lb (68.9 kg)   SpO2 97%   BMI 20.05 kg/m   Gen: No acute distress, resting comfortably MUSCULOSKELETAL: 1+ edema noted to left leg with several varicosities. Neuro: Grossly normal, moves all extremities Psych: Normal affect and thought content  Time Spent: 40 minutes of total time was spent on the date of the encounter performing the following actions: chart review prior to seeing the patient includingrecent visits with PCP, obtaining history, performing a medically necessary exam, counseling on the treatment plan, placing orders, and documenting in our EHR.        Katina Degree. Jimmey Ralph, MD 02/13/2023 12:30 PM

## 2023-02-13 NOTE — Patient Instructions (Signed)
It was very nice to see you today!  Please continue wearing your compression and staying as mobile as possible.  Try to avoid sitting positions for longer than 30 minutes to an hour at a time.  Return if symptoms worsen or fail to improve.   Take care, Dr Jimmey Ralph  PLEASE NOTE:  If you had any lab tests, please let us know if you have not heard back within a few days. You may see your results on mychart before we have a chance to review them but we will give you a call once they are reviewed by [place].   If we ordered any referrals today, please let us know if you have not heard from their office within the next week.   If you had any urgent prescriptions sent in today, please check with the pharmacy within an hour of our visit to make sure the prescription was transmitted appropriately.   Please try these tips to maintain a healthy lifestyle:  Eat at least 3 REAL meals and 1-2 snacks per day.  Aim for no more than 5 hours between eating.  If you eat breakfast, please do so within one hour of getting up.   Each meal should contain half fruits/vegetables, one quarter protein, and one quarter carbs (no bigger than a computer mouse)  Cut down on sweet beverages. This includes juice, soda, and sweet tea.   Drink at least 1 glass of water with each meal and aim for at least 8 glasses per day  Exercise at least 150 minutes every week.

## 2023-02-21 ENCOUNTER — Other Ambulatory Visit (HOSPITAL_COMMUNITY): Payer: Self-pay

## 2023-03-16 ENCOUNTER — Encounter (HOSPITAL_COMMUNITY): Payer: Self-pay | Admitting: Emergency Medicine

## 2023-03-16 ENCOUNTER — Ambulatory Visit (HOSPITAL_COMMUNITY)
Admission: EM | Admit: 2023-03-16 | Discharge: 2023-03-16 | Disposition: A | Discharge status: Home / Self Care | Payer: PPO | Attending: Emergency Medicine | Admitting: Emergency Medicine

## 2023-03-16 ENCOUNTER — Other Ambulatory Visit: Payer: Self-pay

## 2023-03-16 DIAGNOSIS — H6123 Impacted cerumen, bilateral: Secondary | ICD-10-CM

## 2023-03-16 NOTE — ED Triage Notes (Signed)
Reports he was at the [facility] " location to get hearing aids updated.  Patient says they looked in his ears and reported wax in both .

## 2023-03-16 NOTE — Discharge Instructions (Signed)
You can use warm water and a little hydrogen peroxide to irrigate ears as needed. Otherwise return here as needed.

## 2023-03-16 NOTE — ED Provider Notes (Signed)
[facility]    CSN: [id number] Arrival date & time: 03/16/23  1609      History   Chief Complaint Chief Complaint  Patient presents with   Cerumen Impaction    HPI Juan Lin is a 82 y.o. male.   Patient presents with wife after trying to get his hearing aids updated and they reported his ears were full of wax.  Denies dizziness, nausea, headache, and blurred vision.       Past Medical History:  Diagnosis Date   Adenoma 2008   serrated adenoma of colon   Diverticulosis    GERD (gastroesophageal reflux disease)    H. Pylori related-gone when treated   Hyperlipidemia    Prostate cancer [facility]) 1994   radical prostatectomy Dr. 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   Varicose vein of leg 12/08/2017   Hearing loss 12/08/2017   Hemorrhagic prepatellar bursitis of left knee 09/16/2015   Elevated TSH 06/02/2014   History of colon polyps    Basal cell cancer 04/08/2013   Prostate cancer (HCC)    Hyperlipidemia 03/10/2009   Allergic rhinitis 08/06/2007    Past Surgical History:  Procedure Laterality Date   APPENDECTOMY     CATARACT EXTRACTION, BILATERAL Bilateral 2018   CHOLECYSTECTOMY  05/2009   INGUINAL HERNIA REPAIR Right 04/13/2020   Procedure: OPEN RIGHT INGUINAL HERNIA REPAIR;  Surgeon: Fritzi Mandes, MD;  Location: [facility];  Service: General;  Laterality: Right;   prostatectomy  1994   DR.[facility]HL[facility]   TONSILLECTOMY         Home Medications    Prior to Admission medications   Medication Sig Start Date End Date Taking? Authorizing Provider  azithromycin (ZITHROMAX) 500 MG tablet  Take 1 tablet (500 mg total) by mouth 3 (three) times a week. 12/21/22   Randall Hiss, MD  cetirizine (ZYRTEC) 10 MG tablet Take 10 mg by mouth daily.    [provider]  enzalutamide Diana Eves) 40 MG capsule  07/20/22   [provider]  ethambutol (MYAMBUTOL) 400 MG tablet Take 4.5 tablets (1,800 mg total) by mouth 3 (three) times a week. 12/21/22   Randall Hiss, MD  guaiFENesin (MUCINEX) 600 MG 12 hr tablet Take by mouth 2 (two) times daily as needed. Patient not taking: Reported on 02/13/2023    [provider]  MELATONIN PO Take by mouth as needed.    [provider]  Multiple Vitamin (MULTIVITAMIN WITH MINERALS) TABS tablet Take 1 tablet by mouth daily.    [provider]  naproxen sodium (ALEVE) 220 MG tablet Take 220 mg by mouth as needed.    [provider]  Omega-3 Fatty Acids (FISH OIL) 1000 MG CAPS Take by mouth.    [provider]  rifampin (RIFADIN) 300 MG capsule Take 2 capsules (600 mg total) by mouth 3 (three) times a week. 12/21/22   Randall Hiss, MD  valsartan (DIOVAN) 80 MG tablet Take 1 tablet (80 mg total) by mouth 2 (two) times daily. 09/26/22   Shelva Majestic, MD    Family History Family History  Problem Relation Age of Onset   Prostate cancer Paternal Uncle    Prostate cancer Cousin    Pneumonia Father        aspiration   Other Other 3       smoker. sudden death unclear cause   Colon cancer Neg Hx     Social History Social History   Tobacco Use   Smoking status: Never   Smokeless tobacco: Never  Vaping Use   Vaping status: Never Used  Substance Use Topics   Alcohol use: Yes    Alcohol/week: 7.0 standard drinks of alcohol    Types: 7 Standard drinks or equivalent per week    Comment: glass wine with dinner   Drug use: Not Currently    Types: Amyl nitrate, IV     Allergies   Patient has no known allergies.   Review of Systems Review of Systems  Constitutional:   Negative for chills, fatigue and fever.  HENT:  Negative for congestion and ear pain.   Neurological:  Negative for dizziness and headaches.     Physical Exam Triage Vital Signs ED Triage Vitals  Encounter Vitals Group     BP 03/16/23 1656 (!) 150/70     Systolic BP Percentile --      Diastolic BP Percentile --      Pulse Rate 03/16/23 1656 72     Resp 03/16/23 1656 18     Temp 03/16/23 1656 97.9 F (36.6 C)     Temp Source 03/16/23 1656 Oral     SpO2 03/16/23 1656 94 %     Weight --      Height --      Head Circumference --      Peak Flow --      Pain Score 03/16/23 1654 0     Pain Loc --      Pain Education --      Exclude from Growth Chart --    No data found.  Updated Vital Signs BP (!) 145/85 (BP Location: Left Arm)   Pulse 72   Temp 97.9 F (36.6 C) (Oral)   Resp 18   SpO2 94%   Visual Acuity Right Eye Distance:   Left Eye Distance:   Bilateral Distance:    Right Eye Near:   Left Eye Near:    Bilateral Near:     Physical Exam Vitals and nursing note reviewed.  Constitutional:      General: He is awake. He is not in acute distress.    Appearance: Normal appearance. He is not ill-appearing, toxic-appearing or diaphoretic.  HENT:     Right Ear: There is impacted cerumen.     Left Ear: There is impacted cerumen.  Skin:    General: Skin is warm and dry.  Neurological:     Mental Status: He is alert and oriented to person, place, and time.     GCS: GCS eye subscore is 4. GCS verbal subscore is 5. GCS motor subscore is 6.  Psychiatric:        Behavior: Behavior is cooperative.      UC Treatments / Results  Labs (all labs ordered are listed, but only abnormal results are displayed) Labs Reviewed - No data to display  EKG   Radiology No results found.  Procedures Procedures (including critical care time)  Medications Ordered in UC Medications - No data to display  Initial Impression / Assessment and Plan / UC Course  I have reviewed the  triage  vital signs and the nursing notes.  Pertinent labs & imaging results that were available during my care of the patient were reviewed by me and considered in my medical decision making (see chart for details).     Patient presented with wife after trying to get his hearing aids updated and they reported his ears were full of wax. Denies dizziness, headache, ear pain, and blurred vision.  Upon assessment patient has impacted cerumen in bilateral ears.  Upon reassessment after ear irrigation patient has mild irritation to right ear canal.  No obvious signs of infection.  Discussed follow-up and return precautions. Final Clinical Impressions(s) / UC Diagnoses   Final diagnoses:  Bilateral impacted cerumen     Discharge Instructions      You can use warm water and a little hydrogen peroxide to irrigate ears as needed. Otherwise return here as needed.     ED Prescriptions   None    PDMP not reviewed this encounter.   Wynonia Lawman A, NP 03/16/23 1800

## 2023-03-29 ENCOUNTER — Other Ambulatory Visit: Payer: Self-pay | Admitting: Family Medicine

## 2023-03-29 ENCOUNTER — Other Ambulatory Visit (HOSPITAL_COMMUNITY): Payer: Self-pay

## 2023-03-29 MED ORDER — VALSARTAN 80 MG PO TABS
80.0000 mg | ORAL_TABLET | Freq: Two times a day (BID) | ORAL | 5 refills | Status: DC
  Filled 2023-03-29: qty 60, 30d supply, fill #0
  Filled 2023-05-02: qty 60, 30d supply, fill #1
  Filled 2023-05-26: qty 60, 30d supply, fill #2
  Filled 2023-07-04: qty 60, 30d supply, fill #3
  Filled 2023-09-14: qty 60, 30d supply, fill #4
  Filled 2023-11-14: qty 60, 30d supply, fill #5

## 2023-04-01 ENCOUNTER — Encounter: Payer: Self-pay | Admitting: Family Medicine

## 2023-04-06 ENCOUNTER — Telehealth: Payer: Self-pay | Admitting: Family Medicine

## 2023-04-06 NOTE — Telephone Encounter (Signed)
Pt is going on a cruise this Sunday and would like an RX for antinausea patch for the boat cruise. Please advise.

## 2023-04-06 NOTE — Telephone Encounter (Signed)
See below

## 2023-04-06 NOTE — Telephone Encounter (Signed)
Patient requests to be called when RX is sent to  [facility] Phone: [phone]  Fax: [phone]    States needs to pick up by Saturday morning and [facility] is closed Saturday, if RX is not sent in time for the above pick up, requests  RX be sent to [facility] [address] ([address] of [address])

## 2023-04-07 ENCOUNTER — Other Ambulatory Visit: Payer: Self-pay | Admitting: Family

## 2023-04-07 ENCOUNTER — Other Ambulatory Visit (HOSPITAL_COMMUNITY): Payer: Self-pay

## 2023-04-07 MED ORDER — SCOPOLAMINE 1 MG/3DAYS TD PT72
1.0000 | MEDICATED_PATCH | TRANSDERMAL | 0 refills | Status: DC
  Filled 2023-04-07: qty 10, 30d supply, fill #0

## 2023-04-07 NOTE — Telephone Encounter (Signed)
Called and spoke with pt and made aware Rx sent to [facility] pharmacy.

## 2023-05-11 DIAGNOSIS — C61 Malignant neoplasm of prostate: Secondary | ICD-10-CM | POA: Diagnosis not present

## 2023-05-18 DIAGNOSIS — C61 Malignant neoplasm of prostate: Secondary | ICD-10-CM | POA: Diagnosis not present

## 2023-05-21 ENCOUNTER — Encounter: Payer: Self-pay | Admitting: Infectious Disease

## 2023-05-22 DIAGNOSIS — L821 Other seborrheic keratosis: Secondary | ICD-10-CM | POA: Diagnosis not present

## 2023-05-22 DIAGNOSIS — D485 Neoplasm of uncertain behavior of skin: Secondary | ICD-10-CM | POA: Diagnosis not present

## 2023-05-22 DIAGNOSIS — D225 Melanocytic nevi of trunk: Secondary | ICD-10-CM | POA: Diagnosis not present

## 2023-05-22 DIAGNOSIS — L570 Actinic keratosis: Secondary | ICD-10-CM | POA: Diagnosis not present

## 2023-05-22 DIAGNOSIS — C44612 Basal cell carcinoma of skin of right upper limb, including shoulder: Secondary | ICD-10-CM | POA: Diagnosis not present

## 2023-05-22 DIAGNOSIS — D1801 Hemangioma of skin and subcutaneous tissue: Secondary | ICD-10-CM | POA: Diagnosis not present

## 2023-05-22 DIAGNOSIS — Z85828 Personal history of other malignant neoplasm of skin: Secondary | ICD-10-CM | POA: Diagnosis not present

## 2023-05-22 NOTE — Telephone Encounter (Signed)
Not sure if you already talked to [facility] [place] about this, but I agree.

## 2023-05-29 NOTE — Telephone Encounter (Signed)
Patient called to follow up on mychart message from 11/24.  States that he has since stopped taking Ethambutol 11/24. Did relay provider's response regarding concerns could be related to Dvt and not medication.  Patient states that concerns on right leg. Would like to hold off on appointment for now. Would like to know if provider thinks its okay to d/c MAC treatment and see if he completley treated or if he should come in for evaluation.   Juanita Laster, RMA

## 2023-05-30 NOTE — Telephone Encounter (Signed)
It seems to be an extremely rare side effect with a few case reports over the last several years - though, as you mentioned before, he clearly has several other factors that could cause such symptoms including his recent DVT and diabetes.   If he wants to start ethambutol and continue rifampin + azithro + an alternative agent, we could always try that. Could try clofazimine. I'm sure he'd want to avoid linezolid given its potential for neuropathy.   He is so close to his February year-long treatment since negative sputum of February this year. Wonder if he'd be willing to push out by only 2 months. Or could go ahead and assess sputum sample now and call it. He's clearly more than willing to be off treatment with awareness of long-lasting infection.

## 2023-05-31 NOTE — Telephone Encounter (Signed)
Either way sounds good. I will let you or triage on behalf of you respond to his message.

## 2023-06-01 NOTE — Telephone Encounter (Signed)
I will let [facility] answer this.

## 2023-06-01 NOTE — Telephone Encounter (Signed)
It would probably be azithromycin + rifampin + clofazimine. It sounds like he had also mentioned concern with rifampin causing neuropathy as well. Not sure what other medication we'd replace rifampin with if that's the case.

## 2023-06-19 ENCOUNTER — Ambulatory Visit (INDEPENDENT_AMBULATORY_CARE_PROVIDER_SITE_OTHER): Payer: PPO

## 2023-06-19 VITALS — Wt 151.0 lb

## 2023-06-19 DIAGNOSIS — Z0000 Encounter for general adult medical examination without abnormal findings: Secondary | ICD-10-CM | POA: Diagnosis not present

## 2023-06-19 NOTE — Patient Instructions (Addendum)
Juan Lin , Thank you for taking time to come for your Medicare Wellness Visit. I appreciate your ongoing commitment to your health goals. Please review the following plan we discussed and let me know if I can assist you in the future.   Referrals/Orders/Follow-Ups/Clinician Recommendations: maintain health and activity   This is a list of the screening recommended for you and due dates:  Health Maintenance  Topic Date Due   Medicare Annual Wellness Visit  06/18/2024   DTaP/Tdap/Td vaccine (3 - Td or Tdap) 11/03/2025   Pneumonia Vaccine  Completed   Flu Shot  Completed   COVID-19 Vaccine  Completed   Zoster (Shingles) Vaccine  Completed   HPV Vaccine  Aged Out    Advanced directives: (Copy Requested) Please bring a copy of your health care power of attorney and living will to the office to be added to your chart at your convenience.  Next Medicare Annual Wellness Visit scheduled for next year: Yes

## 2023-06-19 NOTE — Progress Notes (Signed)
Subjective:   Juan Lin is a 82 y.o. male who presents for Medicare Annual/Subsequent preventive examination.  Visit Complete: Virtual I connected with  Juan Lin on 06/19/23 by a audio enabled telemedicine application and verified that I am speaking with the correct person using two identifiers.  Patient Location: Home  Provider Location: Office/Clinic  I discussed the limitations of evaluation and management by telemedicine. The patient expressed understanding and agreed to proceed.  Vital Signs: Because this visit was a virtual/telehealth visit, some criteria may be missing or patient reported. Any vitals not documented were not able to be obtained and vitals that have been documented are patient reported.  Patient Medicare AWV questionnaire was completed by the patient on 06/15/23; I have confirmed that all information answered by patient is correct and no changes since this date.  Cardiac Risk Factors include: advanced age (>35men, >25 women);dyslipidemia;male gender;hypertension     Objective:    Today's Vitals   06/19/23 1407  Weight: 151 lb (68.5 kg)   Body mass index is 19.92 kg/m.     06/19/2023    2:12 PM 06/13/2022    2:50 PM 06/10/2021    1:07 PM 06/04/2020    2:43 PM 04/13/2020    8:21 AM 04/07/2020   10:49 AM 04/23/2019   12:02 PM  Advanced Directives  Does Patient Have a Medical Advance Directive? Yes Yes Yes Yes Yes Yes Yes  Type of Estate agent of [place];Living will Healthcare Power of [place];Living will Healthcare Power of [employer] of [place];Living will Out of facility DNR (pink MOST or yellow form) Healthcare Power of [place];Living will Living will;Healthcare Power of Attorney  Does patient want to make changes to medical advance directive?     No - Patient declined No - Patient declined No - Patient declined  Copy of Healthcare Power of Attorney in Chart? No - copy requested No - copy  requested No - copy requested No - copy requested  No - copy requested No - copy requested    Current Medications (verified) Outpatient Encounter Medications as of 06/19/2023  Medication Sig   cetirizine (ZYRTEC) 10 MG tablet Take 10 mg by mouth daily.   enzalutamide (XTANDI) 40 MG capsule    MELATONIN PO Take by mouth as needed.   Multiple Vitamin (MULTIVITAMIN WITH MINERALS) TABS tablet Take 1 tablet by mouth daily.   naproxen sodium (ALEVE) 220 MG tablet Take 220 mg by mouth as needed.   valsartan (DIOVAN) 80 MG tablet Take 1 tablet (80 mg total) by mouth 2 (two) times daily.   guaiFENesin (MUCINEX) 600 MG 12 hr tablet Take by mouth 2 (two) times daily as needed. (Patient not taking: Reported on 06/19/2023)   [DISCONTINUED] azithromycin (ZITHROMAX) 500 MG tablet Take 1 tablet (500 mg total) by mouth 3 (three) times a week.   [DISCONTINUED] ethambutol (MYAMBUTOL) 400 MG tablet Take 4.5 tablets (1,800 mg total) by mouth 3 (three) times a week.   [DISCONTINUED] Omega-3 Fatty Acids (FISH OIL) 1000 MG CAPS Take by mouth.   [DISCONTINUED] rifampin (RIFADIN) 300 MG capsule Take 2 capsules (600 mg total) by mouth 3 (three) times a week.   [DISCONTINUED] scopolamine (TRANSDERM-SCOP) 1 MG/3DAYS Place 1 patch (1.5 mg total) onto the skin every 3 (three) days.   No facility-administered encounter medications on file as of 06/19/2023.    Allergies (verified) Patient has no known allergies.   History: Past Medical History:  Diagnosis Date   Adenoma 2008  serrated adenoma of colon   Diverticulosis    GERD (gastroesophageal reflux disease)    H. Pylori related-gone when treated   Hyperlipidemia    Prostate cancer [facility]) 1994   radical prostatectomy Dr. Richardo Priest   Right inguinal hernia    Seasonal allergies    Sinusitis acute    Past Surgical History:  Procedure Laterality Date   APPENDECTOMY     CATARACT EXTRACTION, BILATERAL Bilateral 2018   CHOLECYSTECTOMY  05/2009   INGUINAL  HERNIA REPAIR Right 04/13/2020   Procedure: OPEN RIGHT INGUINAL HERNIA REPAIR;  Surgeon: Fritzi Mandes, MD;  Location: [facility];  Service: General;  Laterality: Right;   prostatectomy  1994   DR.MOHLER [facility]   TONSILLECTOMY     Family History  Problem Relation Age of Onset   Prostate cancer Paternal Uncle    Prostate cancer Cousin    Pneumonia Father        aspiration   Other Other 24       smoker. sudden death unclear cause   Colon cancer Neg Hx    Social History   Socioeconomic History   Marital status: Married    Spouse name: Not on file   Number of children: Not on file   Years of education: Not on file   Highest education level: Doctorate  Occupational History    Comment: currently volunteers as musician  Tobacco Use   Smoking status: Never   Smokeless tobacco: Never  Vaping Use   Vaping status: Never Used  Substance and Sexual Activity   Alcohol use: Yes    Alcohol/week: 7.0 standard drinks of alcohol    Types: 7 Standard drinks or equivalent per week    Comment: glass wine with dinner   Drug use: Not Currently    Types: Amyl nitrate, IV   Sexual activity: Not on file  Other Topics Concern   Not on file  Social History Narrative   From [place]    Came down to [employer] for graduate studies- Phd in botany.    Music career as "Scientist, forensic" career- Dispensing optician. Plays.    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       Married 1985.1st marriage. No kids. No pets.       Hobbies- above + gardening      Social Drivers of Health   Financial Resource Strain: Low Risk  (06/19/2023)   Overall Financial Resource Strain (CARDIA)    Difficulty of Paying Living Expenses: Not hard at all  Food Insecurity: No Food Insecurity (06/19/2023)   Hunger Vital Sign    Worried About Running Out of Food in the Last Year: Never true    Ran Out of Food in the Last Year: Never true  Transportation  Needs: No Transportation Needs (06/19/2023)   PRAPARE - Administrator, Civil Service (Medical): No    Lack of Transportation (Non-Medical): No  Physical Activity: Sufficiently Active (06/19/2023)   Exercise Vital Sign    Days of Exercise per Week: 4 days    Minutes of Exercise per Session: 50 min  Stress: No Stress Concern Present (06/19/2023)   [employer] of Occupational Health - Occupational Stress Questionnaire    Feeling of Stress : Not at all  Social Connections: Socially Integrated (06/19/2023)   Social Connection and Isolation Panel [NHANES]    Frequency of Communication with Friends and Family: Never    Frequency  of Social Gatherings with Friends and Family: More than three times a week    Attends Religious Services: 1 to 4 times per year    Active Member of [employer] or Organizations: Yes    Attends Banker Meetings: 1 to 4 times per year    Marital Status: Married    Tobacco Counseling Counseling given: Not Answered   Clinical Intake:  Pre-visit preparation completed: Yes  Pain : No/denies pain     BMI - recorded: 19.92 Nutritional Status: BMI of 19-24  Normal Nutritional Risks: None Diabetes: No  How often do you need to have someone help you when you read instructions, pamphlets, or other written materials from your doctor or pharmacy?: 1 - Never  Interpreter Needed?: No  Information entered by :: Lanier Ensign, LPN   Activities of Daily Living    06/15/2023   11:30 PM  In your present state of health, do you have any difficulty performing the following activities:  Hearing? 0  Vision? 0  Difficulty concentrating or making decisions? 0  Walking or climbing stairs? 0  Dressing or bathing? 0  Doing errands, shopping? 0  Preparing Food and eating ? N  Using the Toilet? N  In the past six months, have you accidently leaked urine? N  Do you have problems with loss of bowel control? N  Managing your Medications? N   Managing your Finances? N  Housekeeping or managing your Housekeeping? N    Patient Care Team: Shelva Majestic, MD as PCP - General (Family Medicine) Genia Del, Daisy Blossom, MD as Consulting Physician (Ophthalmology) Jerilee Field, MD as Consulting Physician (Urology) Memory Argue, Wilson Singer, MD as Consulting Physician (Audiology)  Indicate any recent Medical Services you may have received from other than [facility] providers in the past year (date may be approximate).     Assessment:   This is a routine wellness examination for Rondy.  Hearing/Vision screen Hearing Screening - Comments:: Pt wears hearing aids  Vision Screening - Comments:: Pt follows up with steven Miler for annual eye exams    Goals Addressed             This Visit's Progress    Patient Stated       Maintain health and activity        Depression Screen    06/19/2023    2:12 PM 02/13/2023   11:48 AM 09/20/2022    2:13 PM 09/16/2022    2:27 PM 08/09/2022    2:17 PM 06/13/2022    2:47 PM 05/10/2022    2:10 PM  PHQ 2/9 Scores  PHQ - 2 Score 0 0 0 0 0 0 0  PHQ- 9 Score    1       Fall Risk    06/15/2023   11:30 PM 02/13/2023   11:49 AM 12/21/2022    2:43 PM 09/20/2022    2:13 PM 09/16/2022    2:27 PM  Fall Risk   Falls in the past year? 1 0 0 0 0  Number falls in past yr: 0 0 0 0 0  Injury with Fall? 0 0 0 0 0  Risk for fall due to : No Fall Risks No Fall Risks No Fall Risks No Fall Risks No Fall Risks  Follow up Falls prevention discussed  Falls evaluation completed Falls evaluation completed Falls evaluation completed    MEDICARE RISK AT HOME: Medicare Risk at Home Any stairs in or around the home?: (Patient-Rptd) Yes If so, are  there any without handrails?: (Patient-Rptd) No Home free of loose throw rugs in walkways, pet beds, electrical cords, etc?: (Patient-Rptd) Yes Adequate lighting in your home to reduce risk of falls?: (Patient-Rptd) Yes Life alert?: (Patient-Rptd) No Use of a cane,  walker or w/c?: (Patient-Rptd) No Grab bars in the bathroom?: (Patient-Rptd) Yes Shower chair or bench in shower?: (Patient-Rptd) No Elevated toilet seat or a handicapped toilet?: (Patient-Rptd) No  TIMED UP AND GO:  Was the test performed?  No    Cognitive Function:    06/17/2016    4:28 PM  MMSE - Mini Mental State Exam  Not completed: --        06/19/2023    2:15 PM 06/13/2022    2:52 PM 06/10/2021    1:12 PM 06/04/2020    2:48 PM 04/23/2019   12:03 PM  6CIT Screen  What Year? 0 points 0 points 0 points 0 points 0 points  What month? 0 points 0 points 0 points 0 points 0 points  What time? 0 points 0 points 0 points  0 points  Count back from 20 0 points 0 points 0 points 0 points 0 points  Months in reverse 0 points 0 points 0 points 0 points 0 points  Repeat phrase 2 points 0 points 0 points 0 points 0 points  Total Score 2 points 0 points 0 points  0 points    Immunizations Immunization History  Administered Date(s) Administered   Fluad Quad(high Dose 65+) 03/25/2022   Hepatitis A, Adult 12/16/1993, 07/28/1994   Influenza Whole 03/28/1999   Influenza, High Dose Seasonal PF 03/18/2023   Influenza,inj,Quad PF,6+ Mos 04/03/2013, 03/19/2014, 03/19/2021   Influenza-Unspecified 03/21/2011, 04/24/2015, 03/21/2016, 03/21/2017, 03/25/2018, 03/19/2020   Moderna Covid-19 Fall Seasonal Vaccine 32yrs & older 09/22/2022   PFIZER Comirnaty(Gray Top)Covid-19 Tri-Sucrose Vaccine 03/26/2022   PFIZER(Purple Top)SARS-COV-2 Vaccination 07/17/2019, 08/07/2019, 03/25/2020, 03/29/2021   Pneumococcal Conjugate-13 06/02/2014   Pneumococcal Polysaccharide-23 12/07/2006   Respiratory Syncytial Virus Vaccine,Recomb Aduvanted(Arexvy) 06/12/2022   Td 12/07/2006   Tdap 11/04/2015   Typhoid Inactivated 12/16/1993   Yellow Fever 12/16/1993   Zoster Recombinant(Shingrix) 12/13/2017, 02/21/2018   Zoster, Live 02/23/2010    TDAP status: Up to date  Flu Vaccine status: Up to  date  Pneumococcal vaccine status: Up to date  Covid-19 vaccine status: Completed vaccines  Qualifies for Shingles Vaccine? Yes   Zostavax completed Yes   Shingrix Completed?: Yes  Screening Tests Health Maintenance  Topic Date Due   Medicare Annual Wellness (AWV)  06/18/2024   DTaP/Tdap/Td (3 - Td or Tdap) 11/03/2025   Pneumonia Vaccine 88+ Years old  Completed   INFLUENZA VACCINE  Completed   COVID-19 Vaccine  Completed   Zoster Vaccines- Shingrix  Completed   HPV VACCINES  Aged Out    Health Maintenance  There are no preventive care reminders to display for this patient.   Colorectal cancer screening: No longer required.   Additional Screening:   Vision Screening: Recommended annual ophthalmology exams for early detection of glaucoma and other disorders of the eye. Is the patient up to date with their annual eye exam?  Yes  Who is the provider or what is the name of the office in which the patient attends annual eye exams? Dr Jimmye [place]  If pt is not established with a provider, would they like to be referred to a provider to establish care? No .   Dental Screening: Recommended annual dental exams for proper oral hygiene  Community Resource Referral / Chronic  Care Management: CRR required this visit?  No   CCM required this visit?  No     Plan:     I have personally reviewed and noted the following in the patient's chart:   Medical and social history Use of alcohol, tobacco or illicit drugs  Current medications and supplements including opioid prescriptions. Patient is not currently taking opioid prescriptions. Functional ability and status Nutritional status Physical activity Advanced directives List of other physicians Hospitalizations, surgeries, and ER visits in previous 12 months Vitals Screenings to include cognitive, depression, and falls Referrals and appointments  In addition, I have reviewed and discussed with patient certain preventive  protocols, quality metrics, and best practice recommendations. A written personalized care plan for preventive services as well as general preventive health recommendations were provided to patient.     Marzella Schlein, LPN   [id number]   After Visit Summary: (MyChart) Due to this being a telephonic visit, the after visit summary with patients personalized plan was offered to patient via MyChart   Nurse Notes: none

## 2023-07-06 ENCOUNTER — Inpatient Hospital Stay (HOSPITAL_COMMUNITY)
Admission: EM | Admit: 2023-07-06 | Discharge: 2023-07-09 | DRG: 378 | Disposition: A | Discharge status: Home / Self Care | Payer: PPO | Attending: Internal Medicine | Admitting: Internal Medicine

## 2023-07-06 ENCOUNTER — Other Ambulatory Visit: Payer: Self-pay

## 2023-07-06 ENCOUNTER — Encounter (HOSPITAL_COMMUNITY): Payer: Self-pay | Admitting: Emergency Medicine

## 2023-07-06 DIAGNOSIS — E860 Dehydration: Secondary | ICD-10-CM | POA: Diagnosis present

## 2023-07-06 DIAGNOSIS — I10 Essential (primary) hypertension: Secondary | ICD-10-CM | POA: Diagnosis not present

## 2023-07-06 DIAGNOSIS — I700 Atherosclerosis of aorta: Secondary | ICD-10-CM | POA: Diagnosis present

## 2023-07-06 DIAGNOSIS — Z86718 Personal history of other venous thrombosis and embolism: Secondary | ICD-10-CM

## 2023-07-06 DIAGNOSIS — I951 Orthostatic hypotension: Secondary | ICD-10-CM

## 2023-07-06 DIAGNOSIS — K5792 Diverticulitis of intestine, part unspecified, without perforation or abscess without bleeding: Secondary | ICD-10-CM

## 2023-07-06 DIAGNOSIS — Z8546 Personal history of malignant neoplasm of prostate: Secondary | ICD-10-CM | POA: Diagnosis not present

## 2023-07-06 DIAGNOSIS — E871 Hypo-osmolality and hyponatremia: Secondary | ICD-10-CM | POA: Diagnosis not present

## 2023-07-06 DIAGNOSIS — K5733 Diverticulitis of large intestine without perforation or abscess with bleeding: Secondary | ICD-10-CM | POA: Diagnosis not present

## 2023-07-06 DIAGNOSIS — Z860101 Personal history of adenomatous and serrated colon polyps: Secondary | ICD-10-CM

## 2023-07-06 DIAGNOSIS — N179 Acute kidney failure, unspecified: Secondary | ICD-10-CM | POA: Diagnosis not present

## 2023-07-06 DIAGNOSIS — K625 Hemorrhage of anus and rectum: Secondary | ICD-10-CM | POA: Diagnosis not present

## 2023-07-06 DIAGNOSIS — K219 Gastro-esophageal reflux disease without esophagitis: Secondary | ICD-10-CM | POA: Diagnosis present

## 2023-07-06 DIAGNOSIS — K922 Gastrointestinal hemorrhage, unspecified: Principal | ICD-10-CM

## 2023-07-06 DIAGNOSIS — Z79899 Other long term (current) drug therapy: Secondary | ICD-10-CM

## 2023-07-06 DIAGNOSIS — Z8042 Family history of malignant neoplasm of prostate: Secondary | ICD-10-CM

## 2023-07-06 DIAGNOSIS — Z8672 Personal history of thrombophlebitis: Secondary | ICD-10-CM

## 2023-07-06 DIAGNOSIS — E7849 Other hyperlipidemia: Secondary | ICD-10-CM | POA: Diagnosis not present

## 2023-07-06 DIAGNOSIS — Z9841 Cataract extraction status, right eye: Secondary | ICD-10-CM

## 2023-07-06 DIAGNOSIS — Z9842 Cataract extraction status, left eye: Secondary | ICD-10-CM

## 2023-07-06 DIAGNOSIS — D62 Acute posthemorrhagic anemia: Secondary | ICD-10-CM | POA: Diagnosis not present

## 2023-07-06 DIAGNOSIS — E785 Hyperlipidemia, unspecified: Secondary | ICD-10-CM | POA: Diagnosis present

## 2023-07-06 DIAGNOSIS — Z9049 Acquired absence of other specified parts of digestive tract: Secondary | ICD-10-CM

## 2023-07-06 DIAGNOSIS — K5732 Diverticulitis of large intestine without perforation or abscess without bleeding: Secondary | ICD-10-CM | POA: Diagnosis not present

## 2023-07-06 DIAGNOSIS — Z9079 Acquired absence of other genital organ(s): Secondary | ICD-10-CM

## 2023-07-06 DIAGNOSIS — Z85828 Personal history of other malignant neoplasm of skin: Secondary | ICD-10-CM

## 2023-07-06 DIAGNOSIS — K5793 Diverticulitis of intestine, part unspecified, without perforation or abscess with bleeding: Secondary | ICD-10-CM | POA: Diagnosis not present

## 2023-07-06 LAB — CBC
HCT: 41.1 % (ref 39.0–52.0)
Hemoglobin: 14.2 g/dL (ref 13.0–17.0)
MCH: 31.6 pg (ref 26.0–34.0)
MCHC: 34.5 g/dL (ref 30.0–36.0)
MCV: 91.3 fL (ref 80.0–100.0)
Platelets: 243 10*3/uL (ref 150–400)
RBC: 4.5 MIL/uL (ref 4.22–5.81)
RDW: 12.2 % (ref 11.5–15.5)
WBC: 10.9 10*3/uL — ABNORMAL HIGH (ref 4.0–10.5)
nRBC: 0 % (ref 0.0–0.2)

## 2023-07-06 LAB — COMPREHENSIVE METABOLIC PANEL
ALT: 14 U/L (ref 0–44)
AST: 20 U/L (ref 15–41)
Albumin: 3.6 g/dL (ref 3.5–5.0)
Alkaline Phosphatase: 52 U/L (ref 38–126)
Anion gap: 9 (ref 5–15)
BUN: 20 mg/dL (ref 8–23)
CO2: 25 mmol/L (ref 22–32)
Calcium: 8.9 mg/dL (ref 8.9–10.3)
Chloride: 99 mmol/L (ref 98–111)
Creatinine, Ser: 1.39 mg/dL — ABNORMAL HIGH (ref 0.61–1.24)
GFR, Estimated: 50 mL/min — ABNORMAL LOW (ref >60)
Glucose, Bld: 143 mg/dL — ABNORMAL HIGH (ref 70–99)
Potassium: 4.2 mmol/L (ref 3.5–5.1)
Sodium: 133 mmol/L — ABNORMAL LOW (ref 135–145)
Total Bilirubin: 1.3 mg/dL — ABNORMAL HIGH (ref 0.0–1.2)
Total Protein: 7.1 g/dL (ref 6.5–8.1)

## 2023-07-06 NOTE — ED Triage Notes (Signed)
 Pt presents with an about an hour ago onset of bloody stools.  Had 3 episodes in a row with bright red blood.  Pt is shob and states he is weak and unsteady.  No thinners.

## 2023-07-07 ENCOUNTER — Inpatient Hospital Stay (HOSPITAL_COMMUNITY): Payer: PPO

## 2023-07-07 ENCOUNTER — Encounter (HOSPITAL_COMMUNITY): Payer: Self-pay | Admitting: Internal Medicine

## 2023-07-07 DIAGNOSIS — K922 Gastrointestinal hemorrhage, unspecified: Secondary | ICD-10-CM | POA: Diagnosis not present

## 2023-07-07 DIAGNOSIS — K625 Hemorrhage of anus and rectum: Secondary | ICD-10-CM

## 2023-07-07 DIAGNOSIS — E860 Dehydration: Secondary | ICD-10-CM | POA: Diagnosis not present

## 2023-07-07 DIAGNOSIS — K5732 Diverticulitis of large intestine without perforation or abscess without bleeding: Secondary | ICD-10-CM | POA: Diagnosis not present

## 2023-07-07 DIAGNOSIS — D62 Acute posthemorrhagic anemia: Secondary | ICD-10-CM | POA: Diagnosis not present

## 2023-07-07 DIAGNOSIS — Z860101 Personal history of adenomatous and serrated colon polyps: Secondary | ICD-10-CM | POA: Diagnosis not present

## 2023-07-07 DIAGNOSIS — N179 Acute kidney failure, unspecified: Secondary | ICD-10-CM | POA: Diagnosis not present

## 2023-07-07 DIAGNOSIS — Z8546 Personal history of malignant neoplasm of prostate: Secondary | ICD-10-CM

## 2023-07-07 DIAGNOSIS — Z79899 Other long term (current) drug therapy: Secondary | ICD-10-CM | POA: Diagnosis not present

## 2023-07-07 DIAGNOSIS — I10 Essential (primary) hypertension: Secondary | ICD-10-CM | POA: Diagnosis not present

## 2023-07-07 DIAGNOSIS — Z9841 Cataract extraction status, right eye: Secondary | ICD-10-CM | POA: Diagnosis not present

## 2023-07-07 DIAGNOSIS — K5733 Diverticulitis of large intestine without perforation or abscess with bleeding: Secondary | ICD-10-CM | POA: Diagnosis not present

## 2023-07-07 DIAGNOSIS — Z9049 Acquired absence of other specified parts of digestive tract: Secondary | ICD-10-CM | POA: Diagnosis not present

## 2023-07-07 DIAGNOSIS — K5792 Diverticulitis of intestine, part unspecified, without perforation or abscess without bleeding: Secondary | ICD-10-CM

## 2023-07-07 DIAGNOSIS — Z9079 Acquired absence of other genital organ(s): Secondary | ICD-10-CM | POA: Diagnosis not present

## 2023-07-07 DIAGNOSIS — Z9842 Cataract extraction status, left eye: Secondary | ICD-10-CM | POA: Diagnosis not present

## 2023-07-07 DIAGNOSIS — Z86718 Personal history of other venous thrombosis and embolism: Secondary | ICD-10-CM | POA: Diagnosis not present

## 2023-07-07 DIAGNOSIS — Z85828 Personal history of other malignant neoplasm of skin: Secondary | ICD-10-CM | POA: Diagnosis not present

## 2023-07-07 DIAGNOSIS — E7849 Other hyperlipidemia: Secondary | ICD-10-CM

## 2023-07-07 DIAGNOSIS — E871 Hypo-osmolality and hyponatremia: Secondary | ICD-10-CM | POA: Diagnosis not present

## 2023-07-07 DIAGNOSIS — K449 Diaphragmatic hernia without obstruction or gangrene: Secondary | ICD-10-CM | POA: Diagnosis not present

## 2023-07-07 DIAGNOSIS — Z8672 Personal history of thrombophlebitis: Secondary | ICD-10-CM | POA: Diagnosis not present

## 2023-07-07 DIAGNOSIS — K219 Gastro-esophageal reflux disease without esophagitis: Secondary | ICD-10-CM | POA: Diagnosis not present

## 2023-07-07 DIAGNOSIS — Z8042 Family history of malignant neoplasm of prostate: Secondary | ICD-10-CM | POA: Diagnosis not present

## 2023-07-07 DIAGNOSIS — E785 Hyperlipidemia, unspecified: Secondary | ICD-10-CM | POA: Diagnosis not present

## 2023-07-07 DIAGNOSIS — K5793 Diverticulitis of intestine, part unspecified, without perforation or abscess with bleeding: Secondary | ICD-10-CM | POA: Diagnosis not present

## 2023-07-07 DIAGNOSIS — I700 Atherosclerosis of aorta: Secondary | ICD-10-CM | POA: Diagnosis not present

## 2023-07-07 LAB — COMPREHENSIVE METABOLIC PANEL
ALT: 12 U/L (ref 0–44)
AST: 15 U/L (ref 15–41)
Albumin: 3 g/dL — ABNORMAL LOW (ref 3.5–5.0)
Alkaline Phosphatase: 44 U/L (ref 38–126)
Anion gap: 9 (ref 5–15)
BUN: 18 mg/dL (ref 8–23)
CO2: 23 mmol/L (ref 22–32)
Calcium: 8.3 mg/dL — ABNORMAL LOW (ref 8.9–10.3)
Chloride: 102 mmol/L (ref 98–111)
Creatinine, Ser: 1.27 mg/dL — ABNORMAL HIGH (ref 0.61–1.24)
GFR, Estimated: 56 mL/min — ABNORMAL LOW (ref >60)
Glucose, Bld: 112 mg/dL — ABNORMAL HIGH (ref 70–99)
Potassium: 4.5 mmol/L (ref 3.5–5.1)
Sodium: 134 mmol/L — ABNORMAL LOW (ref 135–145)
Total Bilirubin: 0.8 mg/dL (ref 0.0–1.2)
Total Protein: 6 g/dL — ABNORMAL LOW (ref 6.5–8.1)

## 2023-07-07 LAB — HEMOGLOBIN AND HEMATOCRIT, BLOOD
HCT: 33.5 % — ABNORMAL LOW (ref 39.0–52.0)
HCT: 34.1 % — ABNORMAL LOW (ref 39.0–52.0)
Hemoglobin: 11.7 g/dL — ABNORMAL LOW (ref 13.0–17.0)
Hemoglobin: 11.8 g/dL — ABNORMAL LOW (ref 13.0–17.0)

## 2023-07-07 LAB — ABO/RH: ABO/RH(D): B NEG

## 2023-07-07 LAB — PREPARE RBC (CROSSMATCH)

## 2023-07-07 MED ORDER — SODIUM CHLORIDE 0.9 % IV SOLN
INTRAVENOUS | Status: AC
Start: 2023-07-07 — End: 2023-07-08

## 2023-07-07 MED ORDER — ACETAMINOPHEN 650 MG RE SUPP: 650.0000 mg | Freq: Four times a day (QID) | RECTAL | Status: DC | PRN

## 2023-07-07 MED ORDER — SODIUM CHLORIDE 0.9% IV SOLUTION: Freq: Once | INTRAVENOUS | Status: DC

## 2023-07-07 MED ORDER — ONDANSETRON HCL 4 MG/2ML IJ SOLN: 4.0000 mg | Freq: Four times a day (QID) | INTRAMUSCULAR | Status: DC | PRN

## 2023-07-07 MED ORDER — ONDANSETRON HCL 4 MG PO TABS: 4.0000 mg | ORAL_TABLET | Freq: Four times a day (QID) | ORAL | Status: DC | PRN

## 2023-07-07 MED ORDER — SODIUM CHLORIDE 0.9% FLUSH
3.0000 mL | Freq: Two times a day (BID) | INTRAVENOUS | Status: DC
  Administered 2023-07-07 – 2023-07-09 (×5): 3 mL via INTRAVENOUS

## 2023-07-07 MED ORDER — SODIUM CHLORIDE 0.9 % IV SOLN: 250.0000 mL | INTRAVENOUS | Status: AC | PRN

## 2023-07-07 MED ORDER — IOHEXOL 350 MG/ML SOLN
75.0000 mL | Freq: Once | INTRAVENOUS | Status: AC | PRN
  Administered 2023-07-07: 75 mL via INTRAVENOUS

## 2023-07-07 MED ORDER — PANTOPRAZOLE SODIUM 40 MG IV SOLR
40.0000 mg | Freq: Two times a day (BID) | INTRAVENOUS | Status: DC
Start: 2023-07-07 — End: 2023-07-07
  Administered 2023-07-07 (×2): 40 mg via INTRAVENOUS
  Filled 2023-07-07 (×2): qty 10

## 2023-07-07 MED ORDER — ACETAMINOPHEN 325 MG PO TABS
650.0000 mg | ORAL_TABLET | Freq: Four times a day (QID) | ORAL | Status: DC | PRN
  Filled 2023-07-07: qty 2

## 2023-07-07 MED ORDER — SODIUM CHLORIDE 0.9 % IV BOLUS
1000.0000 mL | Freq: Once | INTRAVENOUS | Status: AC
  Administered 2023-07-07: 1000 mL via INTRAVENOUS

## 2023-07-07 MED ORDER — SODIUM CHLORIDE 0.9 % IV SOLN
2.0000 g | INTRAVENOUS | Status: DC
  Administered 2023-07-07 (×2): 2 g via INTRAVENOUS
  Filled 2023-07-07 (×2): qty 20

## 2023-07-07 MED ORDER — METRONIDAZOLE 500 MG/100ML IV SOLN
500.0000 mg | Freq: Two times a day (BID) | INTRAVENOUS | Status: DC
  Administered 2023-07-07 – 2023-07-08 (×4): 500 mg via INTRAVENOUS
  Filled 2023-07-07 (×4): qty 100

## 2023-07-07 MED ORDER — SODIUM CHLORIDE 0.9% FLUSH: 3.0000 mL | INTRAVENOUS | Status: DC | PRN

## 2023-07-07 NOTE — ED Provider Notes (Signed)
 [place] [facility] Provider Note   CSN: [id number] Arrival date & time: 07/06/23  2255     History  Chief Complaint  Patient presents with   Rectal Bleeding    Juan Lin is a 83 y.o. male.  HPI     This is an 83 year old male with a history of MAC, prostate cancer who presents with bright red blood per rectum.  Patient states he had onset of bloody bowel movements around 9:45 PM.  He had 3 subsequent bowel movements that were grossly bloody.  He has pictures to provide support.  He has never had this before.  He does not have any abdominal pain but feels like my stomach is uneasy.  Denies taking any blood thinners.  Has not had any vomiting or hematemesis.  Does report some dizziness.  Home Medications Prior to Admission medications   Medication Sig Start Date End Date Taking? Authorizing Provider  cetirizine  (ZYRTEC ) 10 MG tablet Take 10 mg by mouth daily.    [provider]  enzalutamide REUBIN) 40 MG capsule  07/20/22   [provider]  guaiFENesin (MUCINEX) 600 MG 12 hr tablet Take by mouth 2 (two) times daily as needed. Patient not taking: Reported on 06/19/2023    [provider]  MELATONIN PO Take by mouth as needed.    [provider]  Multiple Vitamin (MULTIVITAMIN WITH MINERALS) TABS tablet Take 1 tablet by mouth daily.    [provider]  naproxen sodium (ALEVE) 220 MG tablet Take 220 mg by mouth as needed.    [provider]  valsartan  (DIOVAN ) 80 MG tablet Take 1 tablet (80 mg total) by mouth 2 (two) times daily. 03/29/23   [name], MD      Allergies    Patient has no known allergies.    Review of Systems   Review of Systems  Constitutional:  Negative for fever.  Respiratory:  Negative for shortness of breath.   Cardiovascular:  Negative for chest pain.  Gastrointestinal:  Positive for blood in stool. Negative for abdominal pain, nausea and vomiting.   Neurological:  Positive for dizziness.  All other systems reviewed and are negative.   Physical Exam Updated Vital Signs BP (!) 127/93 (BP Location: Left Arm)   Pulse (!) 111   Temp 98 F (36.7 C)   Resp 18   SpO2 96%  Physical Exam Vitals and nursing note reviewed.  Constitutional:      Appearance: He is well-developed. He is not ill-appearing.  HENT:     Head: Normocephalic and atraumatic.     Mouth/Throat:     Mouth: Mucous membranes are dry.  Eyes:     Pupils: Pupils are equal, round, and reactive to light.  Cardiovascular:     Rate and Rhythm: Regular rhythm. Tachycardia present.     Heart sounds: Normal heart sounds. No murmur heard. Pulmonary:     Effort: Pulmonary effort is normal. No respiratory distress.     Breath sounds: Normal breath sounds. No wheezing.  Abdominal:     Palpations: Abdomen is soft.     Tenderness: There is no abdominal tenderness. There is no rebound.     Comments: Increased bowel sounds  Genitourinary:    Comments: Rectal exam deferred, grossly bloody bowel movements noted Musculoskeletal:     Cervical back: Neck supple.  Lymphadenopathy:     Cervical: No cervical adenopathy.  Skin:    General: Skin is warm and  dry.  Neurological:     Mental Status: He is alert and oriented to person, place, and time.  Psychiatric:        Mood and Affect: Mood normal.     ED Results / Procedures / Treatments   Labs (all labs ordered are listed, but only abnormal results are displayed) Labs Reviewed  COMPREHENSIVE METABOLIC PANEL - Abnormal; Notable for the following components:      Result Value   Sodium 133 (*)    Glucose, Bld 143 (*)    Creatinine, Ser 1.39 (*)    Total Bilirubin 1.3 (*)    GFR, Estimated 50 (*)    All other components within normal limits  CBC - Abnormal; Notable for the following components:   WBC 10.9 (*)    All other components within normal limits  POC OCCULT BLOOD, ED  TYPE AND SCREEN  ABO/RH    EKG EKG  Interpretation Date/Time:  Thursday July 06 2023 23:53:33 EST Ventricular Rate:  97 PR Interval:  233 QRS Duration:  92 QT Interval:  335 QTC Calculation: 426 R Axis:   48  Text Interpretation: Sinus rhythm Prolonged PR interval RAE, consider biatrial enlargement Consider left ventricular hypertrophy Technically poor tracing Confirmed by Bari Pfeiffer ([id number]) on 07/07/2023 12:31:00 AM  Radiology No results found.  Procedures .Critical Care  Performed by: Bari Pfeiffer FALCON, MD Authorized by: Bari Pfeiffer FALCON, MD   Critical care provider statement:    Critical care time (minutes):  31   Critical care was necessary to treat or prevent imminent or life-threatening deterioration of the following conditions: Acute GI bleed.   Critical care was time spent personally by me on the following activities:  Development of treatment plan with patient or surrogate, discussions with consultants, evaluation of patient's response to treatment, examination of patient, ordering and review of laboratory studies, ordering and review of radiographic studies, ordering and performing treatments and interventions, pulse oximetry, re-evaluation of patient's condition and review of old charts     Medications Ordered in ED Medications  sodium chloride  0.9 % bolus 1,000 mL (has no administration in time range)    ED Course/ Medical Decision Making/ A&P                                 Medical Decision Making Amount and/or Complexity of Data Reviewed Labs: ordered.  Risk Decision regarding hospitalization.   This patient presents to the ED for concern of lower GI bleed, this involves an extensive number of treatment options, and is a complaint that carries with it a high risk of complications and morbidity.  I considered the following differential and admission for this acute, potentially life threatening condition.  The differential diagnosis includes lower GI bleed, brisk upper GI bleed,  diverticulosis  MDM:    This is an 83 year old male who presents with concerns for rectal bleeding.  He is nontoxic and vital signs are notable for tachycardia to 110s.  Does report some dizziness.  Positive orthostasis with drop in blood pressure upon standing.  He was given a liter of fluids.  He otherwise is clinically stable.  Has not had any ongoing bloody bowel movements here in the emergency department.  Initial hemoglobin is normal at 14.2.  Suspect this will downtrend given obvious bloody stools.  Previously saw [facility] for his colonoscopies.  Will send a message for them to evaluate in the morning.  Will plan  for admission to the hospitalist.  He has been typed and screened. (Labs, imaging, consults)  Labs: I Ordered, and personally interpreted labs.  The pertinent results include: CBC, BMP, type and screen  Imaging Studies ordered: I ordered imaging studies including none I independently visualized and interpreted imaging. I agree with the radiologist interpretation  Additional history obtained from wife at bedside.  External records from outside source obtained and reviewed including prior evaluations including colonoscopy  Cardiac Monitoring: The patient was maintained on a cardiac monitor.  If on the cardiac monitor, I personally viewed and interpreted the cardiac monitored which showed an underlying rhythm of: Sinus  Reevaluation: After the interventions noted above, I reevaluated the patient and found that they have :stayed the same  Social Determinants of Health:  lives independently  Disposition: Admit  Co morbidities that complicate the patient evaluation  Past Medical History:  Diagnosis Date   Adenoma 2008   serrated adenoma of colon   Diverticulosis    GERD (gastroesophageal reflux disease)    H. Pylori related-gone when treated   Hyperlipidemia    Prostate cancer (HCC) 1994   radical prostatectomy Dr. Candi [place]STON   Right inguinal hernia    Seasonal  allergies    Sinusitis acute      Medicines Meds ordered this encounter  Medications   sodium chloride  0.9 % bolus 1,000 mL    I have reviewed the patients home medicines and have made adjustments as needed  Problem List / ED Course: Problem List Items Addressed This Visit   None Visit Diagnoses       Lower GI bleed    -  Primary     Orthostasis                       Final Clinical Impression(s) / ED Diagnoses Final diagnoses:  Lower GI bleed  Orthostasis    Rx / DC Orders ED Discharge Orders     None         Bari Charmaine FALCON, MD 07/07/23 [phone]

## 2023-07-07 NOTE — Consult Note (Addendum)
 Consultation  Referring Provider:  [facility]  Primary Care Physician:  [name], MD Primary Gastroenterologist:  Dr. Obie   Reason for Consultation:     diverticular bleed  LOS: 0 days          HPI:   Juan Lin is a 83 y.o. 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  Patient presents to ED 1/10 with 3 episodes of BRBPR  Upon admission Hgb 14.0, HCT 41, MCV 91.  Platelets 243.  BUN 20, CR 1.39.  Hgb has dropped to 11.7.  He has received 1 L IVF  CTA GI bleed shows no evidence of active GI bleeding.  Wall thickening around sigmoid with extensive diverticulosis.  Possible mild diverticulitis  Last colonoscopy in 2016 showed moderate diverticulosis throughout entire examined colon  Patient states last night he has bright red bloody bowel movement at 9:44pm then another followed at 9:49pm. At 9:52pm he had a very small residual blood clot pass and hasn't had any bleeding since. Denies abdominal pain though does note a feeling of runny bowels in his lower abdomen. Denies weight loss, nausea, vomiting. No previous episodes of this occurring before. He notes soon after his bloody bowel movements he did feel dizzy which prompted his visit to ED. He was found to have mild orthostatic hypotension but was otherwise hemodynamically stable.   PREVIOUS GI WORKUP   Colonoscopy 2016 with Dr. Karyle - Moderate diverticulosis throughout entire examined colon - No recall due to age  Colonoscopy 2011 - Severe diverticulosis in sigmoid and descending colon - Sessile polyp in cecum (0.5 cm).  Pathology showed benign colonic mucosa  Past Medical History:  Diagnosis Date    Adenoma 2008   serrated adenoma of colon   Diverticulosis    GERD (gastroesophageal reflux disease)    H. Pylori related-gone when treated   Hyperlipidemia    Prostate cancer [facility]) 1994   radical prostatectomy Dr. Candi HOUSTON   Right inguinal hernia    Seasonal allergies    Sinusitis acute     Surgical History:  He  has a past surgical history that includes prostatectomy (1994); Appendectomy; Tonsillectomy; Cholecystectomy (05/2009); Cataract extraction, bilateral (Bilateral, 2018); and Inguinal hernia repair (Right, 04/13/2020). Family History:  His family history includes Other (age of onset: 21) in an other family member; Pneumonia in his father; Prostate cancer in his cousin and paternal uncle. Social History:   reports that he has never smoked. He has never used smokeless tobacco. He reports current alcohol use of about 7.0 standard drinks of alcohol per week. He reports that he does not currently use drugs after having used the following drugs: Amyl nitrate and IV.  Prior to Admission medications   Medication Sig Start Date End Date Taking? Authorizing Provider  enzalutamide (XTANDI) 40 MG capsule 80 mg every evening. 07/20/22  Yes [provider]  melatonin 1 MG TABS tablet Take 1 mg by mouth at bedtime as needed.   Yes [provider]  Multiple Vitamin (MULTIVITAMIN WITH MINERALS) TABS tablet Take 1 tablet by mouth daily.   Yes [provider]  valsartan  (DIOVAN ) 80 MG tablet Take 1 tablet (80 mg total) by mouth 2 (two) times daily. 03/29/23  Yes [name], MD  Current Facility-Administered Medications  Medication Dose Route Frequency Provider Last Rate Last Admin   0.9 %  sodium chloride  infusion (Manually program via Guardrails IV Fluids)   Intravenous Once Sundil, Subrina, MD   Held at 07/07/23 0117   0.9 %  sodium chloride  infusion  250 mL Intravenous PRN Sundil, Subrina, MD       0.9 %  sodium chloride  infusion   Intravenous Continuous  Sundil, Subrina, MD 125 mL/hr at 07/07/23 0117 New Bag at 07/07/23 0117   acetaminophen  (TYLENOL ) tablet 650 mg  650 mg Oral Q6H PRN Sundil, Subrina, MD       Or   acetaminophen  (TYLENOL ) suppository 650 mg  650 mg Rectal Q6H PRN Sundil, Subrina, MD       cefTRIAXone  (ROCEPHIN ) 2 g in sodium chloride  0.9 % 100 mL IVPB  2 g Intravenous Q24H Sundil, Subrina, MD   Stopped at 07/07/23 0420   metroNIDAZOLE  (FLAGYL ) IVPB 500 mg  500 mg Intravenous Q12H Sundil, Subrina, MD   Stopped at 07/07/23 0438   ondansetron  (ZOFRAN ) tablet 4 mg  4 mg Oral Q6H PRN Sundil, Subrina, MD       Or   ondansetron  (ZOFRAN ) injection 4 mg  4 mg Intravenous Q6H PRN Sundil, Subrina, MD       pantoprazole  (PROTONIX ) injection 40 mg  40 mg Intravenous Q12H Sundil, Subrina, MD   40 mg at 07/07/23 0111   sodium chloride  flush (NS) 0.9 % injection 3 mL  3 mL Intravenous Q12H Sundil, Subrina, MD   3 mL at 07/07/23 0112   sodium chloride  flush (NS) 0.9 % injection 3 mL  3 mL Intravenous PRN Sundil, Subrina, MD       Current Outpatient Medications  Medication Sig Dispense Refill   enzalutamide (XTANDI) 40 MG capsule 80 mg every evening.     melatonin 1 MG TABS tablet Take 1 mg by mouth at bedtime as needed.     Multiple Vitamin (MULTIVITAMIN WITH MINERALS) TABS tablet Take 1 tablet by mouth daily.     valsartan  (DIOVAN ) 80 MG tablet Take 1 tablet (80 mg total) by mouth 2 (two) times daily. 60 tablet 5    Allergies as of 07/06/2023   (No Known Allergies)    Review of Systems  Constitutional:  Negative for chills, fever and weight loss.  HENT:  Negative for hearing loss and tinnitus.   Eyes:  Negative for blurred vision and double vision.  Respiratory:  Negative for cough and hemoptysis.   Cardiovascular:  Negative for chest pain and palpitations.  Gastrointestinal:  Positive for blood in stool. Negative for abdominal pain, constipation, diarrhea, heartburn, melena, nausea and vomiting.  Genitourinary:  Negative for dysuria  and urgency.  Musculoskeletal:  Negative for myalgias and neck pain.  Skin:  Negative for itching and rash.  Neurological:  Positive for dizziness. Negative for seizures, loss of consciousness and headaches.  Psychiatric/Behavioral:  Negative for depression and suicidal ideas.        Physical Exam:  Vital signs in last 24 hours: Temp:  [97.6 F (36.4 C)-98 F (36.7 C)] 97.6 F (36.4 C) (01/10 0749) Pulse Rate:  [76-111] 76 (01/10 0715) Resp:  [15-24] 15 (01/10 0715) BP: (100-136)/(75-93) 124/82 (01/10 0715) SpO2:  [96 %-99 %] 99 % (01/10 0715)   Last BM recorded by nurses in past 5 days No data recorded  Physical Exam Constitutional:      Appearance: Normal appearance. He is not ill-appearing.  HENT:  Head: Normocephalic and atraumatic.     Nose: Nose normal. No congestion.     Mouth/Throat:     Mouth: Mucous membranes are dry.     Pharynx: Oropharynx is clear.  Eyes:     General: No scleral icterus.    Extraocular Movements: Extraocular movements intact.  Cardiovascular:     Rate and Rhythm: Normal rate and regular rhythm.  Pulmonary:     Effort: Pulmonary effort is normal.     Breath sounds: Normal breath sounds.  Abdominal:     General: Bowel sounds are normal. There is no distension.     Palpations: Abdomen is soft. There is no mass.     Tenderness: There is abdominal tenderness (lower abdomen). There is no guarding or rebound.     Hernia: No hernia is present.  Musculoskeletal:        General: No swelling. Normal range of motion.     Cervical back: Normal range of motion and neck supple.  Skin:    General: Skin is warm and dry.  Neurological:     General: No focal deficit present.     Mental Status: He is oriented to person, place, and time.  Psychiatric:        Mood and Affect: Mood normal.        Behavior: Behavior normal.        Thought Content: Thought content normal.        Judgment: Judgment normal.      LAB RESULTS: Recent Labs     07/06/23 2317 07/07/23 0415  WBC 10.9*  --   HGB 14.2 11.7*  HCT 41.1 33.5*  PLT 243  --    BMET Recent Labs    07/06/23 2317 07/07/23 0415  NA 133* 134*  K 4.2 4.5  CL 99 102  CO2 25 23  GLUCOSE 143* 112*  BUN 20 18  CREATININE 1.39* 1.27*  CALCIUM 8.9 8.3*   LFT Recent Labs    07/07/23 0415  PROT 6.0*  ALBUMIN 3.0*  AST 15  ALT 12  ALKPHOS 44  BILITOT 0.8   PT/INR No results for input(s): LABPROT, INR in the last 72 hours.  STUDIES: CT ANGIO GI BLEED Result Date: 07/07/2023 CLINICAL DATA:  Lower GI bleed. Bloody stools. Three episodes in a row with prior red blood. Shortness of breath. Weak and unsteady. EXAM: CTA ABDOMEN AND PELVIS WITHOUT AND WITH CONTRAST TECHNIQUE: Multidetector CT imaging of the abdomen and pelvis was performed using the standard protocol during bolus administration of intravenous contrast. Multiplanar reconstructed images and MIPs were obtained and reviewed to evaluate the vascular anatomy. RADIATION DOSE REDUCTION: This exam was performed according to the departmental dose-optimization program which includes automated exposure control, adjustment of the mA and/or kV according to patient size and/or use of iterative reconstruction technique. CONTRAST:  75mL OMNIPAQUE  IOHEXOL  350 MG/ML SOLN COMPARISON:  PET/CT 04/29/2021 FINDINGS: VASCULAR Scattered calcified atherosclerotic plaque the aorta and its mesenteric, renal, and iliac artery branches without hemodynamically significant stenosis. No aortic aneurysm or dissection. Review of the MIP images confirms the above findings. NON-VASCULAR Lower chest: No acute abnormality. Hepatobiliary: Cholecystectomy.  No acute abnormality. Pancreas: Unremarkable. Spleen: Unremarkable. Adrenals/Urinary Tract: Unremarkable adrenal glands. No urinary calculi or hydronephrosis. Unremarkable bladder. Stomach/Bowel: Wall thickening about the sigmoid colon where there is extensive diverticulosis minimal if any adjacent  stranding. No evidence of active GI bleeding. Small hiatal hernia. No bowel obstruction. The appendix is not definitively visualized. Lymphatic: No lymphadenopathy. Reproductive: Metallic clips  about the prostate. Other: No free intraperitoneal fluid or air. Musculoskeletal: No acute fracture. IMPRESSION: 1. No evidence of active GI bleeding. 2. Wall thickening about the sigmoid colon where there is extensive diverticulosis. Minimal if any adjacent stranding. Findings could represent mild diverticulitis. Aortic Atherosclerosis (ICD10-I70.0). Electronically Signed   By: Norman Gatlin M.D.   On: 07/07/2023 02:46      Impression/Plan   Rectal bleeding History of diverticulosis CTA GI bleed with extensive diverticulosis and possible stranding representing mild diverticulitis Colonoscopy 2016 with extensive diverticulosis throughout colon Initial Hgb 14.0 (now 11.7) Normal BUN Drop in hemoglobin could be from continued bleeding versus hemodilution from IVF versus combination of both. Reassuringly no further bleeding since admission hemodynamically stable -- likely diverticular bleed, appears to maybe be resolving -- continue to monitor for bleeding -- Continue daily CBC and transfuse as needed to maintain HGB > 7  -- continue supportive care -- can continue abx for suspected diverticulitis per CT per primary team.  History of DVT not on anticoagulation  Thank you for your kind consultation, we will continue to follow.   Bayley CHRISTELLA Blower  07/07/2023, 9:15 AM    Attending physician's note  I have taken a history, reviewed the chart and examined the patient. I performed a substantive portion of this encounter, including complete performance of at least one of the key components, in conjunction with the APP. I agree with the APP's note, impression and recommendations.    65 yr M with lower GI bleed and mild sigmoid diverticulitis Likely etiology is acute diverticular hemorrhage for lower GI  bleed Monitor Hgb and transfuse if below 7 Continue Abx for 7 day course  Slowly advance diet as tolerated if hgb stable with no further bleeding   The patient was provided an opportunity to ask questions and all were answered. The patient agreed with the plan and demonstrated an understanding of the instructions.  LOIS Wilkie Mcgee , MD [phone]

## 2023-07-07 NOTE — H&P (Addendum)
 History and Physical    Juan Lin FMW:[id number] DOB: [birth year] DOA: 07/06/2023  PCP: Juan [name], MD   Patient coming from: Home   Chief Complaint:  Chief Complaint  Patient presents with   Rectal Bleeding   ED TRIAGE note:  Pt presents with an about an hour ago onset of bloody stools.  Had 3 episodes in a row with bright red blood.  Pt is shob and states he is weak and unsteady.  No thinners.             HPI:  Juan Lin is a 83 y.o. 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  Patient stated that he has new onset of the bloody bowel movement around 9:45 PM.  He had 3 subsequent bowel movement that were grossly bloody.  Patient never had any experience similar episodes before.  Patient also complaining about abdominal discomfort. Patient denies any nausea, vomiting, chest pain, shortness of breath and palpitation.  Denies any previous episodes of GI bleed in the past.  Reported he takes intermittently naproxen.  Denies any heartburn and dyspepsia.  Per chart review patient has colonoscopy in 2016 which showed moderate diverticulosis. Before that previous colonoscopy in 2021 showed chronic polyp.   ED Course:  At presentation to ED patient is hemodynamically stable CBC WBC count 10.9 hemoglobin 14.0 hematocrit 41, MCV 91 and platelet count 243.  Hemoglobin at baseline. CMP showed slightly low sodium 133, elevated creatinine 1.39 elevated bilirubin 1.3 and GFR 50.  EKG showing normal sinus rhythm heart rate 97, prolonged PR interval, bilateral enlargement. Pending chest x-ray/ In the ED FOBT has been ordered.  1 L of NS bolus has been  given.  ED physician has been consulted [facility] Dr. Ruby who stated that will evaluate patient.  Hospitalist has been contacted for the further management of bright red blood per rectum/GI bleed.  Significant labs in the ED: Lab Orders         Culture, blood (Routine X 2) w Reflex to ID Panel         Comprehensive metabolic panel         CBC         Comprehensive metabolic panel         CBC         Hemoglobin and hematocrit, blood         POC occult blood, ED       Review of Systems:  Review of Systems  Constitutional:  Negative for chills, fever, malaise/fatigue and weight loss.  Respiratory:  Negative for cough, sputum production and shortness of breath.   Cardiovascular:  Negative for chest pain and palpitations.  Gastrointestinal:  Positive for abdominal pain and blood in stool. Negative for constipation, diarrhea, heartburn, melena, nausea and vomiting.  Neurological:  Negative for dizziness and headaches.  Psychiatric/Behavioral:  The patient is not nervous/anxious.   All other systems reviewed and are negative.   Past Medical History:  Diagnosis Date   Adenoma 2008   serrated adenoma of colon   Diverticulosis    GERD (gastroesophageal reflux disease)    H. Pylori related-gone when treated   Hyperlipidemia    Prostate cancer [facility]) 1994   radical prostatectomy Dr. Candi HOUSTON  Right inguinal hernia    Seasonal allergies    Sinusitis acute     Past Surgical History:  Procedure Laterality Date   APPENDECTOMY     CATARACT EXTRACTION, BILATERAL Bilateral 2018   CHOLECYSTECTOMY  05/2009   INGUINAL HERNIA REPAIR Right 04/13/2020   Procedure: OPEN RIGHT INGUINAL HERNIA REPAIR;  Surgeon: Dasie Leonor CROME, MD;  Location: [facility];  Service: General;  Laterality: Right;   prostatectomy  1994   [facility]   TONSILLECTOMY       reports that he has never smoked. He has never used smokeless tobacco. He reports current alcohol use of about 7.0  standard drinks of alcohol per week. He reports that he does not currently use drugs after having used the following drugs: Amyl nitrate and IV.  No Known Allergies  Family History  Problem Relation Age of Onset   Prostate cancer Paternal Uncle    Prostate cancer Cousin    Pneumonia Father        aspiration   Other Other 27       smoker. sudden death unclear cause   Colon cancer Neg Hx     Prior to Admission medications   Medication Sig Start Date End Date Taking? Authorizing Provider  cetirizine  (ZYRTEC ) 10 MG tablet Take 10 mg by mouth daily.    [provider]  enzalutamide REUBIN) 40 MG capsule  07/20/22   [provider]  guaiFENesin (MUCINEX) 600 MG 12 hr tablet Take by mouth 2 (two) times daily as needed. Patient not taking: Reported on 06/19/2023    [provider]  MELATONIN PO Take by mouth as needed.    [provider]  Multiple Vitamin (MULTIVITAMIN WITH MINERALS) TABS tablet Take 1 tablet by mouth daily.    [provider]  naproxen sodium (ALEVE) 220 MG tablet Take 220 mg by mouth as needed.    [provider]  valsartan  (DIOVAN ) 80 MG tablet Take 1 tablet (80 mg total) by mouth 2 (two) times daily. 03/29/23   Juan [name], MD     Physical Exam: Vitals:   07/06/23 2345 07/07/23 0000 07/07/23 0015 07/07/23 0030  BP: 115/78 111/82 114/82 100/75  Pulse: 98 99 98 93  Resp:  (!) 22 19 (!) 24  Temp:      SpO2: 98% 98% 99% 97%    Physical Exam Vitals and nursing note reviewed.  Constitutional:      General: He is not in acute distress.    Appearance: He is not ill-appearing.  HENT:     Mouth/Throat:     Mouth: Mucous membranes are moist.  Eyes:     Conjunctiva/sclera: Conjunctivae normal.  Cardiovascular:     Rate and Rhythm: Normal rate and regular rhythm.     Pulses: Normal pulses.     Heart sounds: Normal heart sounds.  Pulmonary:     Effort: Pulmonary effort is normal.     Breath sounds: Normal  breath sounds.  Abdominal:     General: Bowel sounds are normal. There is no distension.     Palpations: Abdomen is soft.     Tenderness: There is no abdominal tenderness. There is no guarding or rebound.  Musculoskeletal:     Cervical back: Neck supple.     Right lower leg: No edema.     Left lower leg: No edema.  Skin:    Capillary Refill: Capillary refill takes less than 2 seconds.     Coloration: Skin  is not pale.     Findings: No bruising or lesion.  Neurological:     Mental Status: He is alert and oriented to person, place, and time.  Psychiatric:        Mood and Affect: Mood normal.        Thought Content: Thought content normal.        Judgment: Judgment normal.      Media Information   Document Information  Photos    07/07/2023 00:57  Attached To:  Hospital Encounter on 07/06/23  Source Information  Lee Kingfisher, MD  [facility]  Document History     Labs on Admission: I have personally reviewed following labs and imaging studies  CBC: Recent Labs  Lab 07/06/23 2317  WBC 10.9*  HGB 14.2  HCT 41.1  MCV 91.3  PLT 243   Basic Metabolic Panel: Recent Labs  Lab 07/06/23 2317  NA 133*  K 4.2  CL 99  CO2 25  GLUCOSE 143*  BUN 20  CREATININE 1.39*  CALCIUM 8.9   GFR: CrCl cannot be calculated (Unknown ideal weight.). Liver Function Tests: Recent Labs  Lab 07/06/23 2317  AST 20  ALT 14  ALKPHOS 52  BILITOT 1.3*  PROT 7.1  ALBUMIN 3.6   No results for input(s): LIPASE, AMYLASE in the last 168 hours. No results for input(s): AMMONIA in the last 168 hours. Coagulation Profile: No results for input(s): INR, PROTIME in the last 168 hours. Cardiac Enzymes: No results for input(s): CKTOTAL, CKMB, CKMBINDEX, TROPONINI, TROPONINIHS in the last 168 hours. BNP (last 3 results) No results for input(s): BNP in the last 8760 hours. HbA1C: No results for input(s): HGBA1C in the last 72 hours. CBG: No results for  input(s): GLUCAP in the last 168 hours. Lipid Profile: No results for input(s): CHOL, HDL, LDLCALC, TRIG, CHOLHDL, LDLDIRECT in the last 72 hours. Thyroid  Function Tests: No results for input(s): TSH, T4TOTAL, FREET4, T3FREE, THYROIDAB in the last 72 hours. Anemia Panel: No results for input(s): VITAMINB12, FOLATE, FERRITIN, TIBC, IRON, RETICCTPCT in the last 72 hours. Urine analysis:    Component Value Date/Time   BILIRUBINUR n 07/05/2016 1316   PROTEINUR n 07/05/2016 1316   UROBILINOGEN 0.2 07/05/2016 1316   NITRITE n 07/05/2016 1316   LEUKOCYTESUR Negative 07/05/2016 1316    Radiological Exams on Admission: I have personally reviewed images CT ANGIO GI BLEED Result Date: 07/07/2023 CLINICAL DATA:  Lower GI bleed. Bloody stools. Three episodes in a row with prior red blood. Shortness of breath. Weak and unsteady. EXAM: CTA ABDOMEN AND PELVIS WITHOUT AND WITH CONTRAST TECHNIQUE: Multidetector CT imaging of the abdomen and pelvis was performed using the standard protocol during bolus administration of intravenous contrast. Multiplanar reconstructed images and MIPs were obtained and reviewed to evaluate the vascular anatomy. RADIATION DOSE REDUCTION: This exam was performed according to the departmental dose-optimization program which includes automated exposure control, adjustment of the mA and/or kV according to patient size and/or use of iterative reconstruction technique. CONTRAST:  75mL OMNIPAQUE  IOHEXOL  350 MG/ML SOLN COMPARISON:  PET/CT 04/29/2021 FINDINGS: VASCULAR Scattered calcified atherosclerotic plaque the aorta and its mesenteric, renal, and iliac artery branches without hemodynamically significant stenosis. No aortic aneurysm or dissection. Review of the MIP images confirms the above findings. NON-VASCULAR Lower chest: No acute abnormality. Hepatobiliary: Cholecystectomy.  No acute abnormality. Pancreas: Unremarkable. Spleen: Unremarkable.  Adrenals/Urinary Tract: Unremarkable adrenal glands. No urinary calculi or hydronephrosis. Unremarkable bladder. Stomach/Bowel: Wall thickening about the sigmoid colon where there is extensive  diverticulosis minimal if any adjacent stranding. No evidence of active GI bleeding. Small hiatal hernia. No bowel obstruction. The appendix is not definitively visualized. Lymphatic: No lymphadenopathy. Reproductive: Metallic clips about the prostate. Other: No free intraperitoneal fluid or air. Musculoskeletal: No acute fracture. IMPRESSION: 1. No evidence of active GI bleeding. 2. Wall thickening about the sigmoid colon where there is extensive diverticulosis. Minimal if any adjacent stranding. Findings could represent mild diverticulitis. Aortic Atherosclerosis (ICD10-I70.0). Electronically Signed   By: Norman Gatlin M.D.   On: 07/07/2023 02:46     EKG: My personal interpretation of EKG shows: EKG showing sinus rhythm, heart rate 97, prolonged PR [place] and left nipple hypertrophy pattern.     Assessment/Plan: Principal Problem:   GI bleed Active Problems:   Acute diverticulitis   HLD (hyperlipidemia)   History of DVT (deep vein thrombosis)   Essential hypertension   History of prostate cancer   AKI (acute kidney injury) (HCC)   Hyponatremia   Hyperbilirubinemia    Assessment and Plan: GI bleed Bright red blood per rectum History of colonic polyp and diverticulosis Acute diverticulitis -Patient reported 1 episode of bright red blood per rectum.  Afterward he had 3 more episodes of bloody bowel movement per patient report.  In the ED patient is hemodynamically stable.  Patient reported intermittent using of naproxen.  Blood pressure borderline soft which improved with 1 L of NS bolus.  -Given the patient's nature of bleeding, bright red blood per rectum concern for diverticular bleed and lower source of GI bleed with that then upper GI bleed.  However as patient has history of taking  naproxen there is a risk for upper GI bleed as well. Per chart review previous colonoscopy 2011 and 2019 showed chronic polyp, diverticulosis. -Obtaining CT angio GI bleed. - Plan to continue Protonix  40 mg IV twice daily - Continue to check H&H 3 times daily, will transfuse as needed. - Obtaining type and screen and preparing 2 units of blood in case patient need blood transfusion.  Holding for transfusion for now unless hemoglobin drop very rapidly or patient become hemodynamically unstable. -Discussed with both patient and his wife at the bedside, patient has been giving consent for blood transfusion if needed. - ED physician consulted [facility] Dr. Lucita.  Will see patient. - In the ED patient received 1 L of NS bolus.  Continue NS fluid NS 125 cc/h. -Continue cardiac monitoring. Addendum: - CT angio bleed no evidence of active GI bleed.  It showed extensive sigmoid diverticulosis and mild diverticulitis. - Patient denies any fever, chill and diarrhea.  However he has lower abdominal cramping pain. - Obtaining blood cultures and then starting empirical treatment IV ceftriaxone  2 g daily and metronidazole  500 mg twice daily. -Starting clear liquid diet.   Prerenal acute kidney injury - Creatinine 1.39 and GFR 50.  Prerenal acute kidney injury in the setting of hypotension in the context of GI bleed. - Continue continue IV fluid NS 125 cc/h. - Continue to monitor renal function, avoid nephrotoxic agent and monitor urine output.  Hyponatremia - Serum sodium 133.  Hyponatremia in setting of AKI.  Continue to monitor.  Hyperbilirubinemia - Elevated bilirubin 1.3.  Hyperbilirubinemia in the setting of dehydration.  Patient has normal AST and ALT.  Denies any epigastric and right upper quadrant abdominal pain.  Continue to monitor hepatic function panel.  Hypertension -Patient blood pressure is borderline soft.  Holding Diovan   Hyperlipidemia -Currently not any oral antilipemic  agent.  History of  prostate cancer status post radical prostatectomy -History of prostate cancer as per prostectomy 1954. -Patient reported his urologist has been paused to take Xtandi as his PSA level has been improved 19 to 5.   History of superficial left lower extremity DVT - Patient reported he has history of varicose vein and developed left-sided lower superficial DVT.  Patient has been here for been on any anticoagulation in the past.  Given it is not superficial DVT no need to treat with anticoagulation.  Continue TED hose and SCD     DVT prophylaxis:  SCDs Code Status:  Full Code Diet: Npo Family Communication:   Family was present at bedside, at the time of interview. Opportunity was given to ask question and all questions were answered satisfactorily.  Disposition Plan: Pending GI bleed CT study.  Continue to monitor H&H and transfuse as needed.  Monitor patient hemodynamically.  Will follow-up with GI recommendation Consults: Gastroenterology Admission status:   Inpatient, Step Down Unit  Severity of Illness: The appropriate patient status for this patient is INPATIENT. Inpatient status is judged to be reasonable and necessary in order to provide the required intensity of service to ensure the patient's safety. The patient's presenting symptoms, physical exam findings, and initial radiographic and laboratory data in the context of their chronic comorbidities is felt to place them at high risk for further clinical deterioration. Furthermore, it is not anticipated that the patient will be medically stable for discharge from the hospital within 2 midnights of admission.   * I certify that at the point of admission it is my clinical judgment that the patient will require inpatient hospital care spanning beyond 2 midnights from the point of admission due to high intensity of service, high risk for further deterioration and high frequency of surveillance required.DEWAINE    Jnai Snellgrove, MD [facility]  How to contact the [facility] Attending or Consulting provider 7A - 7P or covering provider during after hours 7P -7A, for this patient.  Check the care team in [facility] and look for a) attending/consulting [facility] provider listed and b) the [facility] team listed Log into [email or web address] and use [place]'s universal password to access. If you do not have the password, please contact the hospital operator. Locate the [facility] provider you are looking for under [facility] and page to a number that you can be directly reached. If you still have difficulty reaching the provider, please page the [facility] (Director on Call) for the Hospitalists listed on amion for assistance.  07/07/2023, 2:57 AM

## 2023-07-07 NOTE — Progress Notes (Signed)
 Patient seen and examined.  Wife at the bedside.  Currently complains of mild bloating but denies any other issues.  Denies nausea vomiting.  Wondering whether he can have solid food. In brief, 83 year old with history of diverticulosis, currently not on any anticoagulation presented with 3 episodes of bright red blood per rectum, painless.  Bleeding scan in the emergency room was negative for active bleeding.  He has subsequently stabilized.  Last bowel movement at home before coming to the ER was with trace streaks of blood.  He has pictures of all his bleeding.  Diverticular bleed: Suspected diverticulitis on basis of CT scan.  Stabilizing.  Seen by GI.  Anticipating conservative management.  Continue to monitor hemoglobin every 12 hours.  Continue maintenance IV fluids.  Tolerating clears.  Will advance to full liquid diet.  Patient can be admitted to MedSurg bed given his stability.  Will change admission status.  Same-day admit.  No charge visit.

## 2023-07-08 DIAGNOSIS — K5793 Diverticulitis of intestine, part unspecified, without perforation or abscess with bleeding: Secondary | ICD-10-CM | POA: Diagnosis not present

## 2023-07-08 DIAGNOSIS — K5792 Diverticulitis of intestine, part unspecified, without perforation or abscess without bleeding: Secondary | ICD-10-CM | POA: Diagnosis not present

## 2023-07-08 DIAGNOSIS — Z86718 Personal history of other venous thrombosis and embolism: Secondary | ICD-10-CM | POA: Diagnosis not present

## 2023-07-08 DIAGNOSIS — I10 Essential (primary) hypertension: Secondary | ICD-10-CM | POA: Diagnosis not present

## 2023-07-08 DIAGNOSIS — N179 Acute kidney failure, unspecified: Secondary | ICD-10-CM | POA: Diagnosis not present

## 2023-07-08 LAB — HEMOGLOBIN AND HEMATOCRIT, BLOOD
HCT: 33.3 % — ABNORMAL LOW (ref 39.0–52.0)
Hemoglobin: 11.6 g/dL — ABNORMAL LOW (ref 13.0–17.0)

## 2023-07-08 MED ORDER — AMOXICILLIN-POT CLAVULANATE 875-125 MG PO TABS
1.0000 | ORAL_TABLET | Freq: Two times a day (BID) | ORAL | Status: DC
  Administered 2023-07-08 – 2023-07-09 (×2): 1 via ORAL
  Filled 2023-07-08 (×2): qty 1

## 2023-07-08 MED ORDER — AMOXICILLIN-POT CLAVULANATE 875-125 MG PO TABS
1.0000 | ORAL_TABLET | Freq: Two times a day (BID) | ORAL | 0 refills | Status: DC
  Filled 2023-07-08: qty 12, 6d supply, fill #0

## 2023-07-08 NOTE — Progress Notes (Signed)
 PROGRESS NOTE        PATIENT DETAILS Name: Juan Lin Age: 83 y.o. Sex: male Date of Birth: 20-Sep-1940 Admit Date: 07/06/2023 Admitting Physician Juan Applebaum, MD ERE:Juan, [name], MD  Brief Summary: Patient is a 83 y.o.  male 83 year old with history of HTN, HLD-presented with hematochezia and subsequently admitted to the hospitalist service.    Consults: GI  Subjective: Lying comfortably in bed-denies any chest pain or shortness of breath.Plans were to d/c today-but then had a small volume hematochezia earlier in the day. He is now willing to stay another night  Objective: Vitals: Blood pressure 112/68, pulse 65, temperature 98 F (36.7 C), temperature source Oral, resp. rate 11, weight 68.5 kg, SpO2 93%.   Exam: Gen Exam:Alert awake-not in any distress HEENT:atraumatic, normocephalic Chest: B/L clear to auscultation anteriorly CVS:S1S2 regular Abdomen:soft non tender, non distended Extremities:no edema Neurology: Non focal Skin: no rash  Pertinent Labs/Radiology:    Latest Ref Rng & Units 07/08/2023    5:17 AM 07/07/2023    5:02 PM 07/07/2023    4:15 AM  CBC  Hemoglobin 13.0 - 17.0 g/dL 88.3  88.1  88.2   Hematocrit 39.0 - 52.0 % 33.3  34.1  33.5     Lab Results  Component Value Date   NA 134 (L) 07/07/2023   K 4.5 07/07/2023   CL 102 07/07/2023   CO2 23 07/07/2023      Assessment/Plan: Lower GI bleeding with mild acute blood loss anemia Felt to be diverticular etiology Overall better-still with some lingering GI bleed-as he had a small volume hematochezia this afternoon Slight drop in hemoglobin-down to 11.6-has not require dany transfusion Repeat CBC in am   Acute diverticulitis Hardly any abdominal pain Seen mostly on CT imaging On Rocephin /Flagyl -will switch to Augmentin  to complete 7 days of antibiotics   AKI Mild hemodynamically mediated kidney injury Improving with supportive care    Hyponatremia Mild-doubt any clinical significance   HTN BP initially soft but now creeping up Resume Diovan  on discharge   History of prostate cancer Resume outpatient follow-up with primary oncology/urology  Code status:   Code Status: Full Code   DVT Prophylaxis: SCDs Start: 07/07/23 0048 Place TED hose Start: 07/07/23 0048   Family Communication: None at bedside   Disposition Plan: Status is: Inpatient Remains inpatient appropriate because: Severity of illness   Planned Discharge Destination:Home   Diet: Diet Order             DIET SOFT Room service appropriate? Yes; Fluid consistency: Thin  Diet effective now           Diet - low sodium heart healthy                     Antimicrobial agents: Anti-infectives (From admission, onward)    Start     Dose/Rate Route Frequency Ordered Stop   07/08/23 2200  amoxicillin -clavulanate (AUGMENTIN ) 875-125 MG per tablet 1 tablet        1 tablet Oral Every 12 hours 07/08/23 1039     07/08/23 0000  amoxicillin -clavulanate (AUGMENTIN ) 875-125 MG tablet        1 tablet Oral Every 12 hours 07/08/23 1039 07/14/23 2359   07/07/23 0315  metroNIDAZOLE  (FLAGYL ) IVPB 500 mg  Status:  Discontinued        500 mg 100 mL/hr  over 60 Minutes Intravenous Every 12 hours 07/07/23 0254 07/08/23 1039   07/07/23 0300  cefTRIAXone  (ROCEPHIN ) 2 g in sodium chloride  0.9 % 100 mL IVPB  Status:  Discontinued        2 g 200 mL/hr over 30 Minutes Intravenous Every 24 hours 07/07/23 0254 07/08/23 1039        MEDICATIONS: Scheduled Meds:  sodium chloride    Intravenous Once   amoxicillin -clavulanate  1 tablet Oral Q12H   sodium chloride  flush  3 mL Intravenous Q12H   Continuous Infusions: PRN Meds:.acetaminophen  **OR** acetaminophen , ondansetron  **OR** ondansetron  (ZOFRAN ) IV, sodium chloride  flush   I have personally reviewed following labs and imaging studies  LABORATORY DATA: CBC: Recent Labs  Lab 07/06/23 2317 07/07/23 0415  07/07/23 1702 07/08/23 0517  WBC 10.9*  --   --   --   HGB 14.2 11.7* 11.8* 11.6*  HCT 41.1 33.5* 34.1* 33.3*  MCV 91.3  --   --   --   PLT 243  --   --   --     Basic Metabolic Panel: Recent Labs  Lab 07/06/23 2317 07/07/23 0415  NA 133* 134*  K 4.2 4.5  CL 99 102  CO2 25 23  GLUCOSE 143* 112*  BUN 20 18  CREATININE 1.39* 1.27*  CALCIUM 8.9 8.3*    GFR: Estimated Creatinine Clearance: 42 mL/min (A) (by C-G formula based on SCr of 1.27 mg/dL (H)).  Liver Function Tests: Recent Labs  Lab 07/06/23 2317 07/07/23 0415  AST 20 15  ALT 14 12  ALKPHOS 52 44  BILITOT 1.3* 0.8  PROT 7.1 6.0*  ALBUMIN 3.6 3.0*   No results for input(s): LIPASE, AMYLASE in the last 168 hours. No results for input(s): AMMONIA in the last 168 hours.  Coagulation Profile: No results for input(s): INR, PROTIME in the last 168 hours.  Cardiac Enzymes: No results for input(s): CKTOTAL, CKMB, CKMBINDEX, TROPONINI in the last 168 hours.  BNP (last 3 results) No results for input(s): PROBNP in the last 8760 hours.  Lipid Profile: No results for input(s): CHOL, HDL, LDLCALC, TRIG, CHOLHDL, LDLDIRECT in the last 72 hours.  Thyroid  Function Tests: No results for input(s): TSH, T4TOTAL, FREET4, T3FREE, THYROIDAB in the last 72 hours.  Anemia Panel: No results for input(s): VITAMINB12, FOLATE, FERRITIN, TIBC, IRON, RETICCTPCT in the last 72 hours.  Urine analysis:    Component Value Date/Time   BILIRUBINUR n 07/05/2016 1316   PROTEINUR n 07/05/2016 1316   UROBILINOGEN 0.2 07/05/2016 1316   NITRITE n 07/05/2016 1316   LEUKOCYTESUR Negative 07/05/2016 1316    Sepsis Labs: Lactic Acid, Venous No results found for: LATICACIDVEN  MICROBIOLOGY: Recent Results (from the past 240 hours)  Culture, blood (Routine X 2) w Reflex to ID Panel     Status: None (Preliminary result)   Collection Time: 07/07/23  3:28 AM   Specimen: BLOOD   Result Value Ref Range Status   Specimen Description BLOOD BLOOD RIGHT ARM  Final   Special Requests   Final    BOTTLES DRAWN AEROBIC AND ANAEROBIC Blood Culture results may not be optimal due to an inadequate volume of blood received in culture bottles   Culture   Final    NO GROWTH 1 DAY Performed at [facility], [address], [place], [place] [address]    Report Status PENDING  Incomplete  Culture, blood (Routine X 2) w Reflex to ID Panel     Status: None (Preliminary result)  Collection Time: 07/07/23  3:28 AM   Specimen: BLOOD  Result Value Ref Range Status   Specimen Description BLOOD BLOOD RIGHT HAND  Final   Special Requests   Final    BOTTLES DRAWN AEROBIC AND ANAEROBIC Blood Culture results may not be optimal due to an inadequate volume of blood received in culture bottles   Culture   Final    NO GROWTH 1 DAY Performed at [facility], [address], [place], [place] [address]    Report Status PENDING  Incomplete    RADIOLOGY STUDIES/RESULTS: CT ANGIO GI BLEED Result Date: 07/07/2023 CLINICAL DATA:  Lower GI bleed. Bloody stools. Three episodes in a row with prior red blood. Shortness of breath. Weak and unsteady. EXAM: CTA ABDOMEN AND PELVIS WITHOUT AND WITH CONTRAST TECHNIQUE: Multidetector CT imaging of the abdomen and pelvis was performed using the standard protocol during bolus administration of intravenous contrast. Multiplanar reconstructed images and MIPs were obtained and reviewed to evaluate the vascular anatomy. RADIATION DOSE REDUCTION: This exam was performed according to the departmental dose-optimization program which includes automated exposure control, adjustment of the mA and/or kV according to patient size and/or use of iterative reconstruction technique. CONTRAST:  75mL OMNIPAQUE  IOHEXOL  350 MG/ML SOLN COMPARISON:  PET/CT 04/29/2021 FINDINGS: VASCULAR Scattered calcified atherosclerotic plaque the aorta and its mesenteric, renal, and iliac  artery branches without hemodynamically significant stenosis. No aortic aneurysm or dissection. Review of the MIP images confirms the above findings. NON-VASCULAR Lower chest: No acute abnormality. Hepatobiliary: Cholecystectomy.  No acute abnormality. Pancreas: Unremarkable. Spleen: Unremarkable. Adrenals/Urinary Tract: Unremarkable adrenal glands. No urinary calculi or hydronephrosis. Unremarkable bladder. Stomach/Bowel: Wall thickening about the sigmoid colon where there is extensive diverticulosis minimal if any adjacent stranding. No evidence of active GI bleeding. Small hiatal hernia. No bowel obstruction. The appendix is not definitively visualized. Lymphatic: No lymphadenopathy. Reproductive: Metallic clips about the prostate. Other: No free intraperitoneal fluid or air. Musculoskeletal: No acute fracture. IMPRESSION: 1. No evidence of active GI bleeding. 2. Wall thickening about the sigmoid colon where there is extensive diverticulosis. Minimal if any adjacent stranding. Findings could represent mild diverticulitis. Aortic Atherosclerosis (ICD10-I70.0). Electronically Signed   By: Norman Gatlin M.D.   On: 07/07/2023 02:46     LOS: 1 day   Donalda Applebaum, MD  [facility]    To contact the attending provider between 7A-7P or the covering provider during after hours 7P-7A, please log into the web site [email or web address] and access using universal [place] password for that web site. If you do not have the password, please call the hospital operator.  07/08/2023, 1:53 PM

## 2023-07-08 NOTE — Progress Notes (Signed)
 No bleeding overnight Hb stable Inquiring about discharge-we both agree that we will watch him for the rest of the day-and if he remains stable-possible discharge later this evening.

## 2023-07-08 NOTE — Progress Notes (Addendum)
 Progress Note   LOS: 1 day   Chief Complaint: Diverticular bleed   Subjective   Patient has had no bowel movement and no bleeding since arriving at the hospital.  Denies abdominal pain, nausea, vomiting.  Tolerating diet without difficulty.  Hemoglobin stable.  No family available at the time of my evaluation.   Objective   Vital signs in last 24 hours: Temp:  [97.9 F (36.6 C)-98.3 F (36.8 C)] 98 F (36.7 C) (01/11 0400) Pulse Rate:  [65-73] 65 (01/11 0400) Resp:  [10-17] 11 (01/11 0400) BP: (112-160)/(68-89) 112/68 (01/11 0400) SpO2:  [92 %-100 %] 93 % (01/11 0400) Weight:  [68.5 kg] 68.5 kg (01/11 1100) Last BM Date : 07/07/23 Last BM recorded by nurses in past 5 days No data recorded  General:   male in no acute distress  Heart:  Regular rate and rhythm; no murmurs Pulm: Clear anteriorly; no wheezing Abdomen: soft, nondistended, normal bowel sounds in all quadrants. Nontender without guarding. No organomegaly appreciated. Extremities:  No edema Neurologic:  Alert and  oriented x4;  No focal deficits.  Psych:  Cooperative. Normal mood and affect.  Intake/Output from previous day: 01/10 0701 - 01/11 0700 In: 496.8 [IV Piggyback:496.8] Out: 1600 [Urine:1600] Intake/Output this shift: No intake/output data recorded.  Studies/Results: CT ANGIO GI BLEED Result Date: 07/07/2023 CLINICAL DATA:  Lower GI bleed. Bloody stools. Three episodes in a row with prior red blood. Shortness of breath. Weak and unsteady. EXAM: CTA ABDOMEN AND PELVIS WITHOUT AND WITH CONTRAST TECHNIQUE: Multidetector CT imaging of the abdomen and pelvis was performed using the standard protocol during bolus administration of intravenous contrast. Multiplanar reconstructed images and MIPs were obtained and reviewed to evaluate the vascular anatomy. RADIATION DOSE REDUCTION: This exam was performed according to the departmental dose-optimization program which includes automated exposure control,  adjustment of the mA and/or kV according to patient size and/or use of iterative reconstruction technique. CONTRAST:  75mL OMNIPAQUE  IOHEXOL  350 MG/ML SOLN COMPARISON:  PET/CT 04/29/2021 FINDINGS: VASCULAR Scattered calcified atherosclerotic plaque the aorta and its mesenteric, renal, and iliac artery branches without hemodynamically significant stenosis. No aortic aneurysm or dissection. Review of the MIP images confirms the above findings. NON-VASCULAR Lower chest: No acute abnormality. Hepatobiliary: Cholecystectomy.  No acute abnormality. Pancreas: Unremarkable. Spleen: Unremarkable. Adrenals/Urinary Tract: Unremarkable adrenal glands. No urinary calculi or hydronephrosis. Unremarkable bladder. Stomach/Bowel: Wall thickening about the sigmoid colon where there is extensive diverticulosis minimal if any adjacent stranding. No evidence of active GI bleeding. Small hiatal hernia. No bowel obstruction. The appendix is not definitively visualized. Lymphatic: No lymphadenopathy. Reproductive: Metallic clips about the prostate. Other: No free intraperitoneal fluid or air. Musculoskeletal: No acute fracture. IMPRESSION: 1. No evidence of active GI bleeding. 2. Wall thickening about the sigmoid colon where there is extensive diverticulosis. Minimal if any adjacent stranding. Findings could represent mild diverticulitis. Aortic Atherosclerosis (ICD10-I70.0). Electronically Signed   By: Norman Gatlin M.D.   On: 07/07/2023 02:46    Lab Results: Recent Labs    07/06/23 2317 07/07/23 0415 07/07/23 1702 07/08/23 0517  WBC 10.9*  --   --   --   HGB 14.2 11.7* 11.8* 11.6*  HCT 41.1 33.5* 34.1* 33.3*  PLT 243  --   --   --    BMET Recent Labs    07/06/23 2317 07/07/23 0415  NA 133* 134*  K 4.2 4.5  CL 99 102  CO2 25 23  GLUCOSE 143* 112*  BUN 20 18  CREATININE  1.39* 1.27*  CALCIUM 8.9 8.3*   LFT Recent Labs    07/07/23 0415  PROT 6.0*  ALBUMIN 3.0*  AST 15  ALT 12  ALKPHOS 44  BILITOT 0.8    PT/INR No results for input(s): LABPROT, INR in the last 72 hours.   Scheduled Meds:  sodium chloride    Intravenous Once   amoxicillin -clavulanate  1 tablet Oral Q12H   sodium chloride  flush  3 mL Intravenous Q12H   Continuous Infusions:    Patient profile:   83 y.o. 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    Impression/plan:   Rectal bleeding History of diverticulosis CTA GI bleed with extensive diverticulosis and possible stranding representing mild diverticulitis Colonoscopy 2016 with extensive diverticulosis throughout colon Initial Hgb 11.6, stable (14.0 on admission) Normal BUN Drop in hemoglobin could be from continued bleeding versus hemodilution from IVF versus combination of both. Reassuringly no further bleeding since admission hemodynamically stable -- likely diverticular bleed, appears to maybe be resolving -- continue to monitor for bleeding -- Continue daily CBC and transfuse as needed to maintain HGB > 7  -- continue supportive care -- can continue abx for suspected diverticulitis per CT per primary team.   History of DVT not on anticoagulation   Bayley M McMichael  07/08/2023, 12:37 PM   Attending physician's note   I have taken a history, reviewed the chart and examined the patient. I performed a substantive portion of this encounter, including complete performance of at least one of the key components, in conjunction with the APP. I agree with the APP's note, impression and recommendations.    Likely etiology mild acute diverticulitis with diverticular hemorrhage No further bleeding, hemoglobin stable Advance diet to soft low  residue Continue supportive care Continue antibiotics to complete 7-day course, can transition to oral antibiotics on discharge  GI signing off, available for any questions  The patient was provided an opportunity to ask questions and all were answered. The patient agreed with the plan and demonstrated an understanding of the instructions.   LOIS Wilkie Mcgee , MD [phone]

## 2023-07-08 NOTE — Plan of Care (Signed)
  Problem: Education: Goal: Knowledge of General Education information will improve Description: Including pain rating scale, medication(s)/side effects and non-pharmacologic comfort measures Outcome: Progressing   Problem: Health Behavior/Discharge Planning: Goal: Ability to manage health-related needs will improve Outcome: Progressing   Problem: Clinical Measurements: Goal: Diagnostic test results will improve Outcome: Progressing   Problem: Nutrition: Goal: Adequate nutrition will be maintained Outcome: Progressing   Problem: Elimination: Goal: Will not experience complications related to bowel motility Outcome: Progressing   Problem: Pain Management: Goal: General experience of comfort will improve Outcome: Progressing

## 2023-07-09 DIAGNOSIS — K5793 Diverticulitis of intestine, part unspecified, without perforation or abscess with bleeding: Secondary | ICD-10-CM

## 2023-07-09 LAB — TYPE AND SCREEN
ABO/RH(D): B NEG
Antibody Screen: NEGATIVE
Unit division: 0
Unit division: 0

## 2023-07-09 LAB — BPAM RBC
Blood Product Expiration Date: 202501162359
Blood Product Expiration Date: 202501242359
ISSUE DATE / TIME: 202501071131
ISSUE DATE / TIME: 202501071131
Unit Type and Rh: 1700
Unit Type and Rh: 1700

## 2023-07-09 LAB — HEMOGLOBIN AND HEMATOCRIT, BLOOD
HCT: 33.3 % — ABNORMAL LOW (ref 39.0–52.0)
Hemoglobin: 11.6 g/dL — ABNORMAL LOW (ref 13.0–17.0)

## 2023-07-09 MED ORDER — AMOXICILLIN-POT CLAVULANATE 875-125 MG PO TABS: 1.0000 | ORAL_TABLET | Freq: Two times a day (BID) | ORAL | 0 refills | Status: AC

## 2023-07-09 MED ORDER — DOCUSATE SODIUM 100 MG PO CAPS: 200.0000 mg | ORAL_CAPSULE | Freq: Two times a day (BID) | ORAL | 0 refills | Status: AC | PRN

## 2023-07-09 NOTE — Plan of Care (Signed)
  Problem: Education: Goal: Knowledge of General Education information will improve Description: Including pain rating scale, medication(s)/side effects and non-pharmacologic comfort measures Outcome: Progressing   Problem: Health Behavior/Discharge Planning: Goal: Ability to manage health-related needs will improve Outcome: Progressing   Problem: Clinical Measurements: Goal: Will remain free from infection Outcome: Progressing Goal: Diagnostic test results will improve Outcome: Progressing   Problem: Pain Management: Goal: General experience of comfort will improve Outcome: Progressing

## 2023-07-09 NOTE — Discharge Summary (Signed)
 Juan Lin FMW:[id number] DOB: [birth year] DOA: 07/06/2023  PCP: Juan [name], MD  Admit date: 07/06/2023  Discharge date: 07/09/2023  Admitted From: Home   Disposition:  Home   Recommendations for Outpatient Follow-up:   Follow up with PCP in 1-2 weeks  PCP Please obtain BMP/CBC, 2 view CXR in 1week,  (see Discharge instructions)   PCP Please follow up on the following pending results:    Home Health: None   Equipment/Devices: None  Consultations: GI Discharge Condition: Stable    CODE STATUS: Full    Diet Recommendation: Soft diet for the next 3 days then advance to regular consistency heart healthy diet as tolerated.  Avoid constipation.    Chief Complaint  Patient presents with   Rectal Bleeding     Brief history of present illness from the day of admission and additional interim summary    83 y.o.  male 83 year old with history of HTN, HLD-presented with hematochezia and subsequently admitted to the hospitalist service.                                                                  Hospital Course   Acute diverticulitis with acute lower GI bleeding with mild acute blood loss anemia Felt to be diverticular etiology, seen by gastroenterology, treated conservatively with bowel rest, IV fluids and antibiotics, bleeding has stopped H&H is stable.  Will be discharged home on 6 more days of oral antibiotics along with soft diet and as needed stool softeners to avoid constipation.  PCP to monitor H&H and arrange for outpatient [facility] follow-up in 7 to 10 days postdischarge.   AKI Mild hemodynamically mediated kidney injury Improving with supportive care   Hyponatremia Mild-doubt any clinical significance   HTN BP initially soft but now creeping up Resume Diovan  on discharge   History of  prostate cancer Resume outpatient follow-up with primary oncology/urology    Discharge diagnosis     Principal Problem:   GI bleed Active Problems:   Acute diverticulitis   HLD (hyperlipidemia)   History of DVT (deep vein thrombosis)   Essential hypertension   History of prostate cancer   AKI (acute kidney injury) (HCC)   Hyponatremia   Hyperbilirubinemia   Bright red rectal bleeding    Discharge instructions    Discharge Instructions     Call MD for:   Complete by: As directed    Recurrent hematochezia   Discharge instructions   Complete by: As directed    Follow with Primary MD Juan [name], MD in 7 days   Get CBC, CMP, 2 view Chest X ray -  checked next visit with your primary MD   Activity: As tolerated with Full fall precautions use walker/cane & assistance as needed  Disposition Home   Diet: Soft  diet for the next 3 days then advance to regular consistency heart healthy diet as tolerated, avoid constipation.  Special Instructions: If you have smoked or chewed Tobacco  in the last 2 yrs please stop smoking, stop any regular Alcohol  and or any Recreational drug use.  On your next visit with your primary care physician please Get Medicines reviewed and adjusted.  Please request your Prim.MD to go over all Hospital Tests and Procedure/Radiological results at the follow up, please get all Hospital records sent to your Prim MD by signing hospital release before you go home.  If you experience worsening of your admission symptoms, develop shortness of breath, life threatening emergency, suicidal or homicidal thoughts you must seek medical attention immediately by calling 911 or calling your MD immediately  if symptoms less severe.  You Must read complete instructions/literature along with all the possible adverse reactions/side effects for all the Medicines you take and that have been prescribed to you. Take any new Medicines after you have completely understood  and accpet all the possible adverse reactions/side effects.   Do not drive when taking Pain medications.  Do not take more than prescribed Pain, Sleep and Anxiety Medications   Increase activity slowly   Complete by: As directed    Increase activity slowly   Complete by: As directed        Discharge Medications   Allergies as of 07/09/2023   No Known Allergies      Medication List     TAKE these medications    amoxicillin -clavulanate 875-125 MG tablet Commonly known as: AUGMENTIN  Take 1 tablet by mouth every 12 (twelve) hours for 6 days.   docusate sodium  100 MG capsule Commonly known as: Colace Take 2 capsules (200 mg total) by mouth 2 (two) times daily as needed for mild constipation.   enzalutamide 40 MG capsule Commonly known as: XTANDI 80 mg every evening.   melatonin 1 MG Tabs tablet Take 1 mg by mouth at bedtime as needed.   multivitamin with minerals Tabs tablet Take 1 tablet by mouth daily.   valsartan  80 MG tablet Commonly known as: DIOVAN  Take 1 tablet (80 mg total) by mouth 2 (two) times daily.         Follow-up Information     Juan [name], MD. Schedule an appointment as soon as possible for a visit in 1 week(s).   Specialty: Family Medicine Contact information: [address] [place] [address] [phone]         Shila Gustav GAILS, MD. Schedule an appointment as soon as possible for a visit in 1 week(s).   Specialty: Gastroenterology Contact information: [address] [place] [address] [phone]                 Major procedures and Radiology Reports - PLEASE review detailed and final reports thoroughly  -     CT ANGIO GI BLEED Result Date: 07/07/2023 CLINICAL DATA:  Lower GI bleed. Bloody stools. Three episodes in a row with prior red blood. Shortness of breath. Weak and unsteady. EXAM: CTA ABDOMEN AND PELVIS WITHOUT AND WITH CONTRAST TECHNIQUE: Multidetector CT imaging of the abdomen and  pelvis was performed using the standard protocol during bolus administration of intravenous contrast. Multiplanar reconstructed images and MIPs were obtained and reviewed to evaluate the vascular anatomy. RADIATION DOSE REDUCTION: This exam was performed according to the departmental dose-optimization program which includes automated exposure control, adjustment of the mA and/or kV according to patient  size and/or use of iterative reconstruction technique. CONTRAST:  75mL OMNIPAQUE  IOHEXOL  350 MG/ML SOLN COMPARISON:  PET/CT 04/29/2021 FINDINGS: VASCULAR Scattered calcified atherosclerotic plaque the aorta and its mesenteric, renal, and iliac artery branches without hemodynamically significant stenosis. No aortic aneurysm or dissection. Review of the MIP images confirms the above findings. NON-VASCULAR Lower chest: No acute abnormality. Hepatobiliary: Cholecystectomy.  No acute abnormality. Pancreas: Unremarkable. Spleen: Unremarkable. Adrenals/Urinary Tract: Unremarkable adrenal glands. No urinary calculi or hydronephrosis. Unremarkable bladder. Stomach/Bowel: Wall thickening about the sigmoid colon where there is extensive diverticulosis minimal if any adjacent stranding. No evidence of active GI bleeding. Small hiatal hernia. No bowel obstruction. The appendix is not definitively visualized. Lymphatic: No lymphadenopathy. Reproductive: Metallic clips about the prostate. Other: No free intraperitoneal fluid or air. Musculoskeletal: No acute fracture. IMPRESSION: 1. No evidence of active GI bleeding. 2. Wall thickening about the sigmoid colon where there is extensive diverticulosis. Minimal if any adjacent stranding. Findings could represent mild diverticulitis. Aortic Atherosclerosis (ICD10-I70.0). Electronically Signed   By: Norman Gatlin M.D.   On: 07/07/2023 02:46    Micro Results    Recent Results (from the past 240 hours)  Culture, blood (Routine X 2) w Reflex to ID Panel     Status: None (Preliminary  result)   Collection Time: 07/07/23  3:28 AM   Specimen: BLOOD  Result Value Ref Range Status   Specimen Description BLOOD BLOOD RIGHT ARM  Final   Special Requests   Final    BOTTLES DRAWN AEROBIC AND ANAEROBIC Blood Culture results may not be optimal due to an inadequate volume of blood received in culture bottles   Culture   Final    NO GROWTH 2 DAYS Performed at [facility], [address], [place], [place] [address]    Report Status PENDING  Incomplete  Culture, blood (Routine X 2) w Reflex to ID Panel     Status: None (Preliminary result)   Collection Time: 07/07/23  3:28 AM   Specimen: BLOOD  Result Value Ref Range Status   Specimen Description BLOOD BLOOD RIGHT HAND  Final   Special Requests   Final    BOTTLES DRAWN AEROBIC AND ANAEROBIC Blood Culture results may not be optimal due to an inadequate volume of blood received in culture bottles   Culture   Final    NO GROWTH 2 DAYS Performed at [facility], [address] [place], [place] [address]    Report Status PENDING  Incomplete    Today   Subjective    Lester Crickenberger today has no headache,no chest abdominal pain,no new weakness tingling or numbness, feels much better wants to go home today.    Objective   Blood pressure 125/78, pulse 75, temperature 98 F (36.7 C), temperature source Oral, resp. rate 16, weight 68.5 kg, SpO2 95%.  No intake or output data in the 24 hours ending 07/09/23 0945  Exam  Awake Alert, No new F.N deficits,    [place].AT,PERRAL Supple Neck,   Symmetrical Chest wall movement, Good air movement bilaterally, CTAB RRR,No Gallops,   +ve B.Sounds, Abd Soft, Non tender,  No Cyanosis, Clubbing or edema    Data Review   Recent Labs  Lab 07/06/23 2317 07/07/23 0415 07/07/23 1702 07/08/23 0517 07/09/23 0516  WBC 10.9*  --   --   --   --   HGB 14.2 11.7* 11.8* 11.6* 11.6*  HCT 41.1 33.5* 34.1* 33.3* 33.3*  PLT 243  --   --   --   --  MCV 91.3  --   --   --   --   MCH  31.6  --   --   --   --   MCHC 34.5  --   --   --   --   RDW 12.2  --   --   --   --     Recent Labs  Lab 07/06/23 2317 07/07/23 0415  NA 133* 134*  K 4.2 4.5  CL 99 102  CO2 25 23  ANIONGAP 9 9  GLUCOSE 143* 112*  BUN 20 18  CREATININE 1.39* 1.27*  AST 20 15  ALT 14 12  ALKPHOS 52 44  BILITOT 1.3* 0.8  ALBUMIN 3.6 3.0*  CALCIUM 8.9 8.3*    Total Time in preparing paper work, data evaluation and todays exam - 35 minutes  Signature  -    Lavada Stank M.D on 07/09/2023 at 9:45 AM   -  To page go to [email or web address]

## 2023-07-09 NOTE — Progress Notes (Signed)
 AVS instructions reviewed with patient and his wife. Soft low residue diet information printed and questions answered. Patient discharging via private vehicle.

## 2023-07-09 NOTE — Discharge Instructions (Signed)
 Follow with Primary MD [name], MD in 7 days   Get CBC, CMP, 2 view Chest X ray -  checked next visit with your primary MD   Activity: As tolerated with Full fall precautions use walker/cane & assistance as needed  Disposition Home   Diet: Soft diet for the next 3 days then advance to regular consistency heart healthy diet as tolerated, avoid constipation.  Special Instructions: If you have smoked or chewed Tobacco  in the last 2 yrs please stop smoking, stop any regular Alcohol  and or any Recreational drug use.  On your next visit with your primary care physician please Get Medicines reviewed and adjusted.  Please request your Prim.MD to go over all Hospital Tests and Procedure/Radiological results at the follow up, please get all Hospital records sent to your Prim MD by signing hospital release before you go home.  If you experience worsening of your admission symptoms, develop shortness of breath, life threatening emergency, suicidal or homicidal thoughts you must seek medical attention immediately by calling 911 or calling your MD immediately  if symptoms less severe.  You Must read complete instructions/literature along with all the possible adverse reactions/side effects for all the Medicines you take and that have been prescribed to you. Take any new Medicines after you have completely understood and accpet all the possible adverse reactions/side effects.   Do not drive when taking Pain medications.  Do not take more than prescribed Pain, Sleep and Anxiety Medications

## 2023-07-10 ENCOUNTER — Other Ambulatory Visit (HOSPITAL_COMMUNITY): Payer: Self-pay

## 2023-07-10 ENCOUNTER — Telehealth: Payer: Self-pay | Admitting: *Deleted

## 2023-07-10 NOTE — Transitions of Care (Post Inpatient/ED Visit) (Signed)
 07/10/2023  Name: Juan Lin MRN: [id number] DOB: 28-Jan-1941  Today's TOC FU Call Status: Today's TOC FU Call Status:: Successful TOC FU Call Completed TOC FU Call Complete Date: 07/10/23 Patient's Name and Date of Birth confirmed.  Transition Care Management Follow-up Telephone Call Date of Discharge: 07/09/23 Discharge Facility: [facility]) Type of Discharge: Inpatient Admission Primary Inpatient Discharge Diagnosis:: GI Bleed How have you been since you were released from the hospital?: Better Any questions or concerns?: Yes Patient Questions/Concerns:: Patient was asking how to tell if you are constipated Patient Questions/Concerns Addressed: Other: (RN discussed symptoms of constipation)  Items Reviewed: Did you receive and understand the discharge instructions provided?: Yes Medications obtained,verified, and reconciled?: Yes (Medications Reviewed) Any new allergies since your discharge?: No Dietary orders reviewed?: Yes Type of Diet Ordered:: Soft diet for the next 3 days then advance to regular consistency heart healthy diet as tolerated, avoid constipation. Do you have support at home?: Yes People in Home: spouse Name of Support/Comfort Primary Source: Ronal Bless  Medications Reviewed Today: Medications Reviewed Today     Reviewed by Kennieth Cathlean VEAR, RN (Case Manager) on 07/10/23 at 1202  Med List Status: <None>   Medication Order Taking? Sig Documenting Provider Last Dose Status Informant  amoxicillin -clavulanate (AUGMENTIN ) 875-125 MG tablet [id number] Yes Take 1 tablet by mouth every 12 (twelve) hours for 6 days. Dennise Lavada POUR, MD Taking Active   docusate sodium  (COLACE) 100 MG capsule [id number] Yes Take 2 capsules (200 mg total) by mouth 2 (two) times daily as needed for mild constipation. Singh, Prashant K, MD Taking Active   enzalutamide REUBIN) 40 MG capsule [id number] Yes 80 mg every evening. [provider] Taking Active Self,  Pharmacy Records, Spouse/Significant Other  melatonin 1 MG TABS tablet [id number] Yes Take 1 mg by mouth at bedtime as needed. [provider] Taking Active Self, Spouse/Significant Other, Pharmacy Records  Multiple Vitamin (MULTIVITAMIN WITH MINERALS) TABS tablet [id number] Yes Take 1 tablet by mouth daily. [provider] Taking Active Self, Pharmacy Records, Spouse/Significant Other  valsartan  (DIOVAN ) 80 MG tablet [id number] Yes Take 1 tablet (80 mg total) by mouth 2 (two) times daily. [name], MD Taking Active Self, Pharmacy Records, Spouse/Significant Other            Home Care and Equipment/Supplies: Were Home Health Services Ordered?: NA Any new equipment or medical supplies ordered?: NA  Functional Questionnaire: Do you need assistance with bathing/showering or dressing?: No Do you need assistance with meal preparation?: Yes Do you need assistance with eating?: No Do you have difficulty maintaining continence: No Do you need assistance with getting out of bed/getting out of a chair/moving?: No Do you have difficulty managing or taking your medications?: No  Follow up appointments reviewed: PCP Follow-up appointment confirmed?: Yes Date of PCP follow-up appointment?: 07/17/23 Follow-up Provider: Dr Garnette Katrinka Specialist [facility] Follow-up appointment confirmed?: No Reason Specialist Follow-Up Not Confirmed: Patient has Specialist Provider Number and will Call for Appointment Do you need transportation to your follow-up appointment?: No Do you understand care options if your condition(s) worsen?: Yes-patient verbalized understanding  SDOH Interventions Today    Flowsheet Row Most Recent Value  SDOH Interventions   Food Insecurity Interventions Intervention Not Indicated  Housing Interventions Intervention Not Indicated  Transportation Interventions Intervention Not Indicated, Patient Resources (Friends/Family)  Utilities Interventions  Intervention Not Indicated      Interventions Today    Flowsheet Row Most Recent Value  Chronic Disease   Chronic  disease during today's visit Other  [GI Bleed]  General Interventions   General Interventions Discussed/Reviewed General Interventions Discussed, General Interventions Reviewed, Doctor Visits  Doctor Visits Discussed/Reviewed PCP, Specialist  PCP/Specialist Visits Compliance with follow-up visit  Education Interventions   Education Provided Provided Education  Provided Verbal Education On Medication  [Docusate capsule  Take 2 capsules (200 mg total) by mouth 2 (two) times daily as needed for mild constipation]  Nutrition Interventions   Nutrition Discussed/Reviewed Nutrition Discussed, Fluid intake  Pharmacy Interventions   Pharmacy Dicussed/Reviewed Pharmacy Topics Discussed, Pharmacy Topics Reviewed     Patient declined f/u care coordination services with RN  Cathlean Headland BSN RN Population Health- Transition of Care Team.  [facility] [phone]

## 2023-07-10 NOTE — Progress Notes (Signed)
 07/11/2023 Juan Lin [id number] [birth year]  Referring provider: [name], MD Primary GI doctor: Dr. Charlanne (Dr. Obie)  ASSESSMENT AND PLAN:   Diverticulosis with recent GI bleed Recent episodes of significant rectal bleeding, now resolved. CT scan showed sigmoid thickening and stranding suggestive of inflammation. No abdominal pain, nausea, or vomiting. Patient is currently on antibiotics. -Check blood count and iron levels to assess for anemia. -Consider colonoscopy in 6-8 weeks to ensure no malignancy is present, pending patient's decision. -Provide patient with information on high fiber diet and fiber supplements to prevent future diverticulitis episodes. -Most likely this is diverticular bleed versus scad versus diverticulitis however last colonoscopy was 9 years ago has some sigmoid thickening, discussed potentially getting colonoscopy 6 to 8 weeks to rule out malignancy with the patient and his wife, at this time they are not interested will but will think about it.  Will schedule 3 to 4-month follow-up to discuss further.    Prostate Cancer Patient is currently on Xtandi for prostate cancer management. Reports fatigue as a side effect. -Continue current management.  Follow-up in 2-3 months to reassess and discuss potential colonoscopy. If patient experiences continued rectal bleeding, abdominal pain, or weight loss, he should seek immediate medical attention.   Patient Care Team: [name], MD as PCP - General (Family Medicine) Regenia, Prentice Clack, MD as Consulting Physician (Ophthalmology) Nieves Cough, MD as Consulting Physician (Urology) Latanya, Corean MATSU, MD as Consulting Physician (Audiology)  HISTORY OF PRESENT ILLNESS: 83 y.o. male with a past medical history of  prostate cancer status post radical prostatectomy, history of colonic polyp, basal cell cancer, superficial thrombophlebitis, chronic superficial DVT of the left leg (not on  anticoagulation), essential hypertension, hyperlipidemia and aortic atherosclerosis and others listed below presents for hospital follow-up  08/15/2014 colonoscopy with Dr. Obie good prep with movie prep showed moderate diverticulosis no recall due to age Patient was admitted 1/10 through 1/12 for 3 episodes of bright red bleeding hemoglobin dropped to 3 units from 14-11.7 CTA shows no evidence of active GI bleeding wall thickening around sigmoid with extensive diverticulosis possible mild diverticulitis.   Placed on antibiotics 7 days. Still on ABX.  07/06/2023  HGB 11.6 MCV 91.3 Platelets 243 B12 534  Discussed the use of AI scribe software for clinical note transcription with the patient, who gave verbal consent to proceed.  History of Present Illness   The bleeding was characterized by three episodes of large volume, bright red blood, which occurred over a span of 20 minutes. The patient reported feeling dizzy during these episodes, but denied any abdominal pain or discomfort until after the bowel movement. There was no associated nausea or vomiting. The patient also reported a history of diverticulosis, with a family history of diverticulitis in a sibling and an uncle.  The patient's bowel movements in the hospital and at home showed a gradual decrease in the amount of blood, with the most recent bowel movement at home showing only a small amount of blood. The patient denied any rectal pain or discomfort with these bowel movements. The patient also reported a history of peripheral neuropathy in both feet, but did not specify the cause.  The patient has a history of prostate cancer, currently managed with Xtandi, which has successfully reduced the PSA to 0.5. However, the patient reported feeling tired, which he attributed to the medication. The patient also reported a history of dizziness during physical activity, specifically during a recent episode of [place] country  dancing that occurred prior  to the bleed. The patient denied any chest pain or shortness of breath.  The patient's diet is reportedly high in fiber, with a focus on whole grains and vegetables. Despite this, the patient has a history of diverticulosis and has been advised to add a fiber supplement to his diet. The patient's weight has been stable, hovering around 150 pounds, as advised by his primary care physician. The patient denied any recent weight loss.  He  reports that he has never smoked. He has never used smokeless tobacco. He reports current alcohol use of about 7.0 standard drinks of alcohol per week. He reports that he does not currently use drugs after having used the following drugs: Amyl nitrate and IV.  Wt Readings from Last 3 Encounters:  07/11/23 152 lb (68.9 kg)  07/08/23 151 lb 0.2 oz (68.5 kg)  06/19/23 151 lb (68.5 kg)    RELEVANT LABS AND IMAGING:  CT scan 01/09 IMPRESSION: 1. No evidence of active GI bleeding. 2. Wall thickening about the sigmoid colon where there is extensive diverticulosis. Minimal if any adjacent stranding. Findings could represent mild diverticulitis.  Colonoscopy 2016 with Dr. Obie - Moderate diverticulosis throughout entire examined colon - No recall due to age   Colonoscopy 2011 - Severe diverticulosis in sigmoid and descending colon - Sessile polyp in cecum (0.5 cm).  Pathology showed benign colonic mucosa  CBC    Component Value Date/Time   WBC 10.9 (H) 07/06/2023 2317   RBC 4.50 07/06/2023 2317   HGB 11.6 (L) 07/09/2023 0516   HCT 33.3 (L) 07/09/2023 0516   PLT 243 07/06/2023 2317   MCV 91.3 07/06/2023 2317   MCH 31.6 07/06/2023 2317   MCHC 34.5 07/06/2023 2317   RDW 12.2 07/06/2023 2317   LYMPHSABS 2,319 09/22/2022 1404   MONOABS 0.6 02/09/2021 1436   EOSABS 142 09/22/2022 1404   BASOSABS 71 09/22/2022 1404   Recent Labs    09/22/22 1404 07/06/23 2317 07/07/23 0415 07/07/23 1702 07/08/23 0517 07/09/23 0516  HGB 14.7 14.2 11.7* 11.8* 11.6*  11.6*    CMP     Component Value Date/Time   NA 134 (L) 07/07/2023 0415   K 4.5 07/07/2023 0415   CL 102 07/07/2023 0415   CO2 23 07/07/2023 0415   GLUCOSE 112 (H) 07/07/2023 0415   BUN 18 07/07/2023 0415   CREATININE 1.27 (H) 07/07/2023 0415   CREATININE 0.90 09/22/2022 1404   CALCIUM 8.3 (L) 07/07/2023 0415   PROT 6.0 (L) 07/07/2023 0415   ALBUMIN 3.0 (L) 07/07/2023 0415   AST 15 07/07/2023 0415   ALT 12 07/07/2023 0415   ALKPHOS 44 07/07/2023 0415   BILITOT 0.8 07/07/2023 0415   GFRNONAA 56 (L) 07/07/2023 0415   GFRAA  06/04/2007 0946    >60        The eGFR has been calculated using the MDRD equation. This calculation has not been validated in all clinical      Latest Ref Rng & Units 07/07/2023    4:15 AM 07/06/2023   11:17 PM 09/22/2022    2:04 PM  Hepatic Function  Total Protein 6.5 - 8.1 g/dL 6.0  7.1  6.8   Albumin 3.5 - 5.0 g/dL 3.0  3.6    AST 15 - 41 U/L 15  20  22    ALT 0 - 44 U/L 12  14  19    Alk Phosphatase 38 - 126 U/L 44  52    Total Bilirubin 0.0 -  1.2 mg/dL 0.8  1.3  1.2       Current Medications:    Current Outpatient Medications (Cardiovascular):    valsartan  (DIOVAN ) 80 MG tablet, Take 1 tablet (80 mg total) by mouth 2 (two) times daily.     Current Outpatient Medications (Other):    amoxicillin -clavulanate (AUGMENTIN ) 875-125 MG tablet, Take 1 tablet by mouth every 12 (twelve) hours for 6 days.   docusate sodium  (COLACE) 100 MG capsule, Take 2 capsules (200 mg total) by mouth 2 (two) times daily as needed for mild constipation.   enzalutamide (XTANDI) 40 MG capsule, 80 mg every evening.   melatonin 1 MG TABS tablet, Take 1 mg by mouth at bedtime as needed.   Multiple Vitamin (MULTIVITAMIN WITH MINERALS) TABS tablet, Take 1 tablet by mouth daily.  Medical History:  Past Medical History:  Diagnosis Date   Adenoma 2008   serrated adenoma of colon   Diverticulosis    GERD (gastroesophageal reflux disease)    H. Pylori related-gone when  treated   Hyperlipidemia    Prostate cancer [facility]) 1994   radical prostatectomy Dr. Candi HOUSTON   Right inguinal hernia    Seasonal allergies    Sinusitis acute    Allergies: No Known Allergies   Surgical History:  He  has a past surgical history that includes prostatectomy (1994); Appendectomy; Tonsillectomy; Cholecystectomy (05/2009); Cataract extraction, bilateral (Bilateral, 2018); and Inguinal hernia repair (Right, 04/13/2020). Family History:  His family history includes Other (age of onset: 35) in an other family member; Pneumonia in his father; Prostate cancer in his cousin and paternal uncle.  REVIEW OF SYSTEMS  : All other systems reviewed and negative except where noted in the History of Present Illness.  PHYSICAL EXAM: BP 122/68   Pulse 80   Ht 6' 1 (1.854 m)   Wt 152 lb (68.9 kg)   BMI 20.05 kg/m  General Appearance: Thin appearing, in no apparent distress. Head:   Normocephalic and atraumatic. Eyes:  sclerae anicteric,conjunctive pink  Respiratory: Respiratory effort normal, BS equal bilaterally without rales, rhonchi, wheezing. Cardio: RRR with no MRGs. Peripheral pulses intact.  Abdomen: Soft,  Non-distended ,active bowel sounds. No tenderness . Without guarding and Without rebound. No masses. Rectal: Not evaluated Musculoskeletal: Full ROM, Normal gait. Without edema. Skin:  Dry and intact without significant lesions or rashes Neuro: Alert and  oriented x4;  No focal deficits. Psych:  Cooperative. Normal mood and affect.    Alan JONELLE Coombs, PA-C 9:47 AM

## 2023-07-11 ENCOUNTER — Ambulatory Visit: Payer: PPO | Admitting: Physician Assistant

## 2023-07-11 ENCOUNTER — Encounter: Payer: Self-pay | Admitting: Physician Assistant

## 2023-07-11 ENCOUNTER — Other Ambulatory Visit (INDEPENDENT_AMBULATORY_CARE_PROVIDER_SITE_OTHER): Payer: PPO

## 2023-07-11 VITALS — BP 122/68 | HR 80 | Ht 73.0 in | Wt 152.0 lb

## 2023-07-11 DIAGNOSIS — K625 Hemorrhage of anus and rectum: Secondary | ICD-10-CM | POA: Diagnosis not present

## 2023-07-11 DIAGNOSIS — I82502 Chronic embolism and thrombosis of unspecified deep veins of left lower extremity: Secondary | ICD-10-CM

## 2023-07-11 DIAGNOSIS — K5792 Diverticulitis of intestine, part unspecified, without perforation or abscess without bleeding: Secondary | ICD-10-CM | POA: Diagnosis not present

## 2023-07-11 DIAGNOSIS — K5730 Diverticulosis of large intestine without perforation or abscess without bleeding: Secondary | ICD-10-CM | POA: Diagnosis not present

## 2023-07-11 DIAGNOSIS — I700 Atherosclerosis of aorta: Secondary | ICD-10-CM

## 2023-07-11 DIAGNOSIS — C61 Malignant neoplasm of prostate: Secondary | ICD-10-CM

## 2023-07-11 LAB — CBC WITH DIFFERENTIAL/PLATELET
Basophils Absolute: 0.1 10*3/uL (ref 0.0–0.1)
Basophils Relative: 0.9 % (ref 0.0–3.0)
Eosinophils Absolute: 0.3 10*3/uL (ref 0.0–0.7)
Eosinophils Relative: 3.7 % (ref 0.0–5.0)
HCT: 39.4 % (ref 39.0–52.0)
Hemoglobin: 13.3 g/dL (ref 13.0–17.0)
Lymphocytes Relative: 23.8 % (ref 12.0–46.0)
Lymphs Abs: 1.7 10*3/uL (ref 0.7–4.0)
MCHC: 33.8 g/dL (ref 30.0–36.0)
MCV: 93.5 fL (ref 78.0–100.0)
Monocytes Absolute: 0.7 10*3/uL (ref 0.1–1.0)
Monocytes Relative: 9.4 % (ref 3.0–12.0)
Neutro Abs: 4.5 10*3/uL (ref 1.4–7.7)
Neutrophils Relative %: 62.2 % (ref 43.0–77.0)
Platelets: 271 10*3/uL (ref 150.0–400.0)
RBC: 4.21 Mil/uL — ABNORMAL LOW (ref 4.22–5.81)
RDW: 13.1 % (ref 11.5–15.5)
WBC: 7.2 10*3/uL (ref 4.0–10.5)

## 2023-07-11 LAB — COMPREHENSIVE METABOLIC PANEL
ALT: 9 U/L (ref 0–53)
AST: 14 U/L (ref 0–37)
Albumin: 4.1 g/dL (ref 3.5–5.2)
Alkaline Phosphatase: 47 U/L (ref 39–117)
BUN: 23 mg/dL (ref 6–23)
CO2: 28 meq/L (ref 19–32)
Calcium: 8.9 mg/dL (ref 8.4–10.5)
Chloride: 95 meq/L — ABNORMAL LOW (ref 96–112)
Creatinine, Ser: 0.98 mg/dL (ref 0.40–1.50)
GFR: 70.65 mL/min (ref >60.00)
Glucose, Bld: 72 mg/dL (ref 70–99)
Potassium: 3.9 meq/L (ref 3.5–5.1)
Sodium: 131 meq/L — ABNORMAL LOW (ref 135–145)
Total Bilirubin: 0.6 mg/dL (ref 0.2–1.2)
Total Protein: 7.3 g/dL (ref 6.0–8.3)

## 2023-07-11 LAB — IBC + FERRITIN
Ferritin: 132.6 ng/mL (ref 22.0–322.0)
Iron: 59 ug/dL (ref 42–165)
Saturation Ratios: 22.3 % (ref 20.0–50.0)
TIBC: 264.6 ug/dL (ref 250.0–450.0)
Transferrin: 189 mg/dL — ABNORMAL LOW (ref 212.0–360.0)

## 2023-07-11 NOTE — Progress Notes (Signed)
 Agree with assessment/plan.  Edman Circle, MD [facility] [phone]

## 2023-07-11 NOTE — Patient Instructions (Addendum)
 Your provider has requested that you go to the basement level for lab work before leaving today. Press B on the elevator. The lab is located at the first door on the left as you exit the elevator.  FIBER SUPPLEMENT You can do metamucil or fibercon once or twice a day but if this causes gas/bloating please switch to Benefiber or Citracel.  Fiber is good for constipation/diarrhea/irritable bowel syndrome.  It can also help with weight loss and can help lower your bad cholesterol (LDL).  Please do 1 TBSP in the morning in water, coffee, or tea.  It can take up to a month before you can see a difference with your bowel movements.  It is cheapest from [employer], [employer], [employer].   Fiber Chart  You should be consuming 25-30g of fiber per day and drinking 8 glasses of water to help your bowels move regularly.  In the chart below you can look up how much fiber you are getting in an average day.  If you are not getting enough fiber, you should add a fiber supplement to your diet.  Examples of this include Metamucil, FiberCon and Citrucel, benefiber.   These can be purchased at your local grocery store or pharmacy.      [email or web address]  Diverticulosis Diverticulosis is a condition that develops when small pouches (diverticula) form in the wall of the large intestine (colon). The colon is where water is absorbed and stool (feces) is formed. The pouches form when the inside layer of the colon pushes through weak spots in the outer layers of the colon. You may have a few pouches or many of them. The pouches usually do not cause problems unless they become inflamed or infected. When this happens, the condition is called diverticulitis- this is left lower quadrant pain, diarrhea, fever, chills, nausea or vomiting.  If this occurs please call the office or go to the hospital. Sometimes these patches without inflammation can also have painless bleeding  associated with them, if this happens please call the office or go to the hospital. Preventing constipation and increasing fiber can help reduce diverticula and prevent complications. Even if you feel you have a high-fiber diet, suggest getting on Benefiber or Cirtracel 2 times daily.  Due to recent changes in healthcare laws, you may see the results of your imaging and laboratory studies on MyChart before your provider has had a chance to review them.  We understand that in some cases there may be results that are confusing or concerning to you. Not all laboratory results come back in the same time frame and the provider may be waiting for multiple results in order to interpret others.  Please give us  48 hours in order for your provider to thoroughly review all the results before contacting the office for clarification of your results.    I appreciate the  opportunity to care for you  Thank You   [facility]

## 2023-07-12 LAB — CULTURE, BLOOD (ROUTINE X 2)
Culture: NO GROWTH
Culture: NO GROWTH

## 2023-07-13 ENCOUNTER — Other Ambulatory Visit: Payer: PPO

## 2023-07-13 DIAGNOSIS — E785 Hyperlipidemia, unspecified: Secondary | ICD-10-CM | POA: Diagnosis not present

## 2023-07-13 LAB — LIPID PANEL
Cholesterol: 167 mg/dL (ref 0–200)
HDL: 66.3 mg/dL (ref >39.00)
LDL Cholesterol: 86 mg/dL (ref 0–99)
NonHDL: 100.5
Total CHOL/HDL Ratio: 3
Triglycerides: 74 mg/dL (ref 0.0–149.0)
VLDL: 14.8 mg/dL (ref 0.0–40.0)

## 2023-07-13 LAB — CBC WITH DIFFERENTIAL/PLATELET
Basophils Absolute: 0.1 10*3/uL (ref 0.0–0.1)
Basophils Relative: 0.8 % (ref 0.0–3.0)
Eosinophils Absolute: 0.2 10*3/uL (ref 0.0–0.7)
Eosinophils Relative: 2.8 % (ref 0.0–5.0)
HCT: 39.2 % (ref 39.0–52.0)
Hemoglobin: 13.2 g/dL (ref 13.0–17.0)
Lymphocytes Relative: 29.8 % (ref 12.0–46.0)
Lymphs Abs: 1.9 10*3/uL (ref 0.7–4.0)
MCHC: 33.7 g/dL (ref 30.0–36.0)
MCV: 93.6 fL (ref 78.0–100.0)
Monocytes Absolute: 0.5 10*3/uL (ref 0.1–1.0)
Monocytes Relative: 8.5 % (ref 3.0–12.0)
Neutro Abs: 3.8 10*3/uL (ref 1.4–7.7)
Neutrophils Relative %: 58.1 % (ref 43.0–77.0)
Platelets: 281 10*3/uL (ref 150.0–400.0)
RBC: 4.19 Mil/uL — ABNORMAL LOW (ref 4.22–5.81)
RDW: 13.3 % (ref 11.5–15.5)
WBC: 6.5 10*3/uL (ref 4.0–10.5)

## 2023-07-13 LAB — COMPREHENSIVE METABOLIC PANEL
ALT: 9 U/L (ref 0–53)
AST: 16 U/L (ref 0–37)
Albumin: 4.1 g/dL (ref 3.5–5.2)
Alkaline Phosphatase: 44 U/L (ref 39–117)
BUN: 24 mg/dL — ABNORMAL HIGH (ref 6–23)
CO2: 27 meq/L (ref 19–32)
Calcium: 9.2 mg/dL (ref 8.4–10.5)
Chloride: 99 meq/L (ref 96–112)
Creatinine, Ser: 1.12 mg/dL (ref 0.40–1.50)
GFR: 60.19 mL/min (ref >60.00)
Glucose, Bld: 81 mg/dL (ref 70–99)
Potassium: 4.2 meq/L (ref 3.5–5.1)
Sodium: 134 meq/L — ABNORMAL LOW (ref 135–145)
Total Bilirubin: 0.6 mg/dL (ref 0.2–1.2)
Total Protein: 7.3 g/dL (ref 6.0–8.3)

## 2023-07-13 LAB — TSH: TSH: 10.06 u[IU]/mL — ABNORMAL HIGH (ref 0.35–5.50)

## 2023-07-14 ENCOUNTER — Other Ambulatory Visit: Payer: Self-pay

## 2023-07-14 DIAGNOSIS — R7989 Other specified abnormal findings of blood chemistry: Secondary | ICD-10-CM

## 2023-07-17 ENCOUNTER — Encounter: Payer: Self-pay | Admitting: Family Medicine

## 2023-07-17 ENCOUNTER — Ambulatory Visit (INDEPENDENT_AMBULATORY_CARE_PROVIDER_SITE_OTHER): Payer: PPO | Admitting: Family Medicine

## 2023-07-17 VITALS — BP 128/78 | HR 77 | Temp 97.7°F | Ht 73.0 in | Wt 147.4 lb

## 2023-07-17 DIAGNOSIS — I158 Other secondary hypertension: Secondary | ICD-10-CM | POA: Diagnosis not present

## 2023-07-17 DIAGNOSIS — C61 Malignant neoplasm of prostate: Secondary | ICD-10-CM

## 2023-07-17 DIAGNOSIS — Z0000 Encounter for general adult medical examination without abnormal findings: Secondary | ICD-10-CM | POA: Diagnosis not present

## 2023-07-17 DIAGNOSIS — E785 Hyperlipidemia, unspecified: Secondary | ICD-10-CM

## 2023-07-17 NOTE — Patient Instructions (Addendum)
Glad you are doing well all things considered- no significant changes today  Recommended follow up: Return in about 1 year (around 07/16/2024) for physical or sooner if needed.Schedule b4 you leave. But sooner if needed for blood pressure or other concerns

## 2023-07-17 NOTE — Progress Notes (Signed)
Phone: [phone]   Subjective:  Patient presents today for their annual physical. Chief complaint-noted.   See problem oriented charting- ROS- full  review of systems was completed and negative  except for: fatigue , occasional dizzy with dancing night before had bleed  The following were reviewed and entered/updated in epic: Past Medical History:  Diagnosis Date   Adenoma 2008   serrated adenoma of colon   Diverticulosis    GERD (gastroesophageal reflux disease)    H. Pylori related-gone when treated   Hyperlipidemia    Prostate cancer [facility]) 1994   radical prostatectomy Dr. Richardo Priest   Right inguinal hernia    Seasonal allergies    Sinusitis acute    Patient Active Problem List   Diagnosis Date Noted   Leg DVT (deep venous thromboembolism), chronic, left (HCC) 12/27/2022    Priority: High   Other secondary hypertension 11/29/2022    Priority: High   Prostate cancer [facility])     Priority: High   Elevated TSH 06/02/2014    Priority: Medium    HLD (hyperlipidemia) 03/10/2009    Priority: Medium    History of colon polyps     Priority: Low   Malignant basal cell neoplasm of skin 04/08/2013    Priority: Low   Allergic rhinitis 08/06/2007    Priority: Low   GI bleed 07/07/2023   History of DVT (deep vein thrombosis) 07/07/2023   Essential hypertension 07/07/2023   History of prostate cancer 07/07/2023   AKI (acute kidney injury) (HCC) 07/07/2023   Hyponatremia 07/07/2023   Hyperbilirubinemia 07/07/2023   Acute diverticulitis 07/07/2023   Bright red rectal bleeding 07/07/2023   Aortic atherosclerosis (HCC) 12/09/2022   H/O radical prostatectomy 08/18/2021   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   Varicose vein of leg 12/08/2017   Hearing loss 12/08/2017   Hemorrhagic prepatellar bursitis of left knee 09/16/2015   Past Surgical History:  Procedure Laterality Date   APPENDECTOMY     CATARACT EXTRACTION, BILATERAL Bilateral 2018   CHOLECYSTECTOMY   05/2009   INGUINAL HERNIA REPAIR Right 04/13/2020   Procedure: OPEN RIGHT INGUINAL HERNIA REPAIR;  Surgeon: Fritzi Mandes, MD;  Location: [facility];  Service: General;  Laterality: Right;   prostatectomy  1994   DR.MOHLER [facility]   TONSILLECTOMY      Family History  Problem Relation Age of Onset   Pneumonia Father        aspiration   Prostate cancer Paternal Uncle    Prostate cancer Cousin    Other Other 34       smoker. sudden death unclear cause   Colon cancer Neg Hx    Liver disease Neg Hx    Esophageal cancer Neg Hx     Medications- reviewed and updated Current Outpatient Medications  Medication Sig Dispense Refill   docusate sodium (COLACE) 100 MG capsule Take 2 capsules (200 mg total) by mouth 2 (two) times daily as needed for mild constipation. 30 capsule 0   enzalutamide (XTANDI) 40 MG capsule 80 mg every evening.     melatonin 1 MG TABS tablet Take 1 mg by mouth at bedtime as needed.     Multiple Vitamin (MULTIVITAMIN WITH MINERALS) TABS tablet Take 1 tablet by mouth daily.     valsartan (DIOVAN) 80 MG tablet Take 1 tablet (80 mg total) by mouth 2 (two) times daily. 60 tablet 5   No current facility-administered medications for this visit.    Allergies-reviewed and updated No Known Allergies  Social History   Social History Narrative   From [place]    Came down to [facility] for graduate studies- Phd in botany.    Music career as "Scientist, forensic" career- Dispensing optician. Plays.    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       Married 1985.1st marriage. No kids. No pets.       Hobbies- above + gardening      Objective  Objective:  BP 128/78   Pulse 77   Temp 97.7 F (36.5 C)   Ht 6\' 1"  (1.854 m)   Wt 147 lb 6.4 oz (66.9 kg)   SpO2 95%   BMI 19.45 kg/m  Gen: NAD, resting comfortably HEENT: Mucous membranes are moist. Oropharynx normal Neck: no thyromegaly CV: RRR no murmurs  rubs or gallops Lungs: CTAB no crackles, wheeze, rhonchi Abdomen: soft/nontender/nondistended/normal bowel sounds. No rebound or guarding.  Ext: no edema Skin: warm, dry Neuro: grossly normal, moves all extremities, PERRLA   Assessment and Plan  83 y.o. male presenting for annual physical.  Health Maintenance counseling: 1. Anticipatory guidance: Patient counseled regarding regular dental exams -q6 months, eye exams - yearly,  avoiding smoking and second hand smoke , limiting alcohol to 2 beverages per day - extremely rare sip of wine, no illicit drugs .   2. Risk factor reduction:  Advised patient of need for regular exercise and diet rich and fruits and vegetables to reduce risk of heart attack and stroke.  Exercise- still dancing but not to gym as much with health issues.  Diet/weight management-weight down some with recent hospitalization- monitor.  Wt Readings from Last 3 Encounters:  07/17/23 147 lb 6.4 oz (66.9 kg)  07/11/23 152 lb (68.9 kg)  07/08/23 151 lb 0.2 oz (68.5 kg)  3. Immunizations/screenings/ancillary studies- vaccines up to date  Immunization History  Administered Date(s) Administered   Fluad Quad(high Dose 65+) 03/25/2022   Hepatitis A, Adult 12/16/1993, 07/28/1994   Influenza Whole 03/28/1999   Influenza, High Dose Seasonal PF 03/18/2023   Influenza,inj,Quad PF,6+ Mos 04/03/2013, 03/19/2014, 03/19/2021   Influenza-Unspecified 03/21/2011, 04/24/2015, 03/21/2016, 03/21/2017, 03/25/2018, 03/19/2020   Moderna Covid-19 Fall Seasonal Vaccine 50yrs & older 09/22/2022   PFIZER Comirnaty(Gray Top)Covid-19 Tri-Sucrose Vaccine 03/26/2022   PFIZER(Purple Top)SARS-COV-2 Vaccination 07/17/2019, 08/07/2019, 03/25/2020, 03/29/2021   Pneumococcal Conjugate-13 06/02/2014   Pneumococcal Polysaccharide-23 12/07/2006   Respiratory Syncytial Virus Vaccine,Recomb Aduvanted(Arexvy) 06/12/2022   Td 12/07/2006   Tdap 11/04/2015   Typhoid Inactivated 12/16/1993   Yellow Fever  12/16/1993   Zoster Recombinant(Shingrix) 12/13/2017, 02/21/2018   Zoster, Live 02/23/2010  4. Prostate cancer - reports Diana Eves may have a pause upcoming per Dr. Mena Goes - he will keep me in loop and lower blood pressure if needed  Lab Results  Component Value Date   PSA 18.74 (H) 02/09/2021   PSA 14.8 (H) 02/04/2020   PSA 16.01 (H) 12/11/2018   5. Colon cancer screening - considering updating with sigmoid colon findings.  6. Skin cancer screening- sees Dr. Yetta Barre. advised regular sunscreen use. Denies worrisome, changing, or new skin lesions.  7. Smoking associated screening (lung cancer screening, AAA screen 65-75, UA)- never smoker 8. STD screening - only active with wife  Status of chronic or acute concerns   # GI bleed-appears to be related to diverticulitis with hospitalization from January 9 to January 12-treated conservatively with IV fluids and antibiotics with stabilization and H&H and plan for outpatient follow-up.  Already had outpatient  follow-up with gastroenterology.  With some sigmoid thickening they are going to be on the safe side and perform colonoscopy in about 2 months (they are undecided). Finished antibiotics from hospital- discharged Augmentin.  - doing 2 docusate twice daily for now backup and hasn't started benefiber. - discussed maybe another week then reduce colace for a few days then can start adding BENEFIBER gradually   #elevated TSH- during hospitalization- we will repeat next month- already scheduled  #tingling in feet- liekly neuropathy- looked back ain in 2022 already reported for 1-2 years but mild- may have worsened with ethambutol. Also mildly elevated TSH could be cuase- we are recchecking that. No proprioception issues  #hypertension S: medication: valsartan 80 mg twice daily BP Readings from Last 3 Encounters:  07/17/23 128/78  07/11/23 122/68  07/09/23 125/78  A/P: stable- continue current medicines  -as comes off Xtandi he will watch blood  pressure- likely needs to reduce medicine  #hyperlipidemia #aortic atherosclerosis  S: Medication:none  Lab Results  Component Value Date   CHOL 167 07/13/2023   HDL 66.30 07/13/2023   LDLCALC 86 07/13/2023   TRIG 74.0 07/13/2023   CHOLHDL 3 07/13/2023   A/P: lipids slightly above goal for aortic atherosclerosis - TSH being off may have affected some. At his age and with other side effects experiencing hold off on treatment for now  #dilated aorta at 42 cm- wife has informed me that this has been a long term issue over the years- they mentioned annual immaging 12/01/22 we can consider this or stretch this  Recommended follow up: Return in about 1 year (around 07/16/2024) for physical or sooner if needed.Schedule b4 you leave. Future Appointments  Date Time Provider Department Center  08/10/2023 10:00 AM [facility]  08/23/2023  2:30 PM Daiva Eves, Lisette Grinder, MD [facility]  09/21/2023  2:30 PM Doree Albee, PA-C [facility]   Lab/Order associations: already did labs    ICD-10-CM   1. Preventative health care  Z00.00     2. Prostate cancer (HCC) Chronic C61     3. Other secondary hypertension  I15.8     4. Hyperlipidemia, unspecified hyperlipidemia type  E78.5       No orders of the defined types were placed in this encounter.   Return precautions advised.  Tana Conch, MD

## 2023-07-20 ENCOUNTER — Encounter: Payer: PPO | Admitting: Family Medicine

## 2023-08-10 ENCOUNTER — Other Ambulatory Visit (INDEPENDENT_AMBULATORY_CARE_PROVIDER_SITE_OTHER): Payer: PPO

## 2023-08-10 ENCOUNTER — Encounter: Payer: Self-pay | Admitting: Family Medicine

## 2023-08-10 DIAGNOSIS — R7989 Other specified abnormal findings of blood chemistry: Secondary | ICD-10-CM | POA: Diagnosis not present

## 2023-08-10 LAB — T3, FREE: T3, Free: 3.3 pg/mL (ref 2.3–4.2)

## 2023-08-10 LAB — T4, FREE: Free T4: 0.91 ng/dL (ref 0.60–1.60)

## 2023-08-10 LAB — TSH: TSH: 6.15 u[IU]/mL — ABNORMAL HIGH (ref 0.35–5.50)

## 2023-08-23 ENCOUNTER — Ambulatory Visit: Payer: PPO | Admitting: Infectious Disease

## 2023-08-30 ENCOUNTER — Ambulatory Visit: Payer: PPO | Admitting: Physician Assistant

## 2023-09-07 DIAGNOSIS — R9721 Rising PSA following treatment for malignant neoplasm of prostate: Secondary | ICD-10-CM | POA: Diagnosis not present

## 2023-09-14 DIAGNOSIS — C61 Malignant neoplasm of prostate: Secondary | ICD-10-CM | POA: Diagnosis not present

## 2023-09-18 ENCOUNTER — Other Ambulatory Visit: Payer: Self-pay

## 2023-09-18 ENCOUNTER — Ambulatory Visit: Payer: PPO | Admitting: Infectious Disease

## 2023-09-18 VITALS — BP 135/82 | HR 79 | Temp 97.9°F | Wt 155.0 lb

## 2023-09-18 DIAGNOSIS — G629 Polyneuropathy, unspecified: Secondary | ICD-10-CM

## 2023-09-18 DIAGNOSIS — E039 Hypothyroidism, unspecified: Secondary | ICD-10-CM

## 2023-09-18 DIAGNOSIS — A310 Pulmonary mycobacterial infection: Secondary | ICD-10-CM | POA: Diagnosis not present

## 2023-09-18 NOTE — Progress Notes (Signed)
 Subjective:  Chief complaint follow-up for Mycobacterium AVM infection   Patient ID: Juan Lin, male    DOB: 1941-04-28, 83 y.o.   MRN: [id number]  HPI  Discussed the use of AI scribe software for clinical note transcription with the patient, who gave verbal consent to proceed.  History of Present Illness   The patient, with a history of Mycobacterium avium complex (MAC) infection, presents for a follow-up visit. He was previously on a regimen of azithromycin, ethambutol, and rifampin. However, the patient discontinued ethambutol and other 2 drugs approximately four months ago due to perceived peripheral neuropathy, which he noticed in both feet. The patient reports that the neuropathy has not worsened since discontinuing the medication. He denies any other new symptoms. The patient also has a history of thyroid issues, which are being monitored by his primary care physician. Currently, the patient is not experiencing any symptoms related to MAC infection, such as coughing, weight loss, or night sweats. The patient's spouse, who is actively involved in the patient's care, confirms the absence of these symptoms.       Past Medical History:  Diagnosis Date   Adenoma 2008   serrated adenoma of colon   Diverticulosis    GERD (gastroesophageal reflux disease)    H. Pylori related-gone when treated   Hyperlipidemia    Prostate cancer [facility]) 1994   radical prostatectomy Dr. Richardo Priest   Right inguinal hernia    Seasonal allergies    Sinusitis acute     Past Surgical History:  Procedure Laterality Date   APPENDECTOMY     CATARACT EXTRACTION, BILATERAL Bilateral 2018   CHOLECYSTECTOMY  05/2009   INGUINAL HERNIA REPAIR Right 04/13/2020   Procedure: OPEN RIGHT INGUINAL HERNIA REPAIR;  Surgeon: Fritzi Mandes, MD;  Location: [facility];  Service: General;  Laterality: Right;   prostatectomy  1994   DR.MOHLER [facility]   TONSILLECTOMY      Family History  Problem  Relation Age of Onset   Pneumonia Father        aspiration   Prostate cancer Paternal Uncle    Prostate cancer Cousin    Other Other 63       smoker. sudden death unclear cause   Colon cancer Neg Hx    Liver disease Neg Hx    Esophageal cancer Neg Hx       Social History   Socioeconomic History   Marital status: Married    Spouse name: Not on file   Number of children: 0   Years of education: Not on file   Highest education level: Doctorate  Occupational History    Comment: currently volunteers as musician   Occupation: retired  Tobacco Use   Smoking status: Never   Smokeless tobacco: Never  Vaping Use   Vaping status: Never Used  Substance and Sexual Activity   Alcohol use: Yes    Alcohol/week: 7.0 standard drinks of alcohol    Types: 7 Standard drinks or equivalent per week    Comment: glass wine with dinner   Drug use: Not Currently    Types: Amyl nitrate, IV   Sexual activity: Not on file  Other Topics Concern   Not on file  Social History Narrative   From [place]    Came down to [facility] for graduate studies- Phd in botany.    Music career as "Scientist, forensic" career- Dispensing optician. Plays.    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       Married 1985.1st marriage. No kids. No pets.       Hobbies- above + gardening      Social Drivers of Health   Financial Resource Strain: Low Risk  (06/19/2023)   Overall Financial Resource Strain (CARDIA)    Difficulty of Paying Living Expenses: Not hard at all  Food Insecurity: No Food Insecurity (07/10/2023)   Hunger Vital Sign    Worried About Running Out of Food in the Last Year: Never true    Ran Out of Food in the Last Year: Never true  Transportation Needs: No Transportation Needs (07/10/2023)   PRAPARE - Administrator, Civil Service (Medical): No    Lack of Transportation (Non-Medical): No  Physical Activity: Sufficiently Active  (06/19/2023)   Exercise Vital Sign    Days of Exercise per Week: 4 days    Minutes of Exercise per Session: 50 min  Stress: No Stress Concern Present (06/19/2023)   [employer] of Occupational Health - Occupational Stress Questionnaire    Feeling of Stress : Not at all  Social Connections: Socially Integrated (07/07/2023)   Social Connection and Isolation Panel [NHANES]    Frequency of Communication with Friends and Family: Never    Frequency of Social Gatherings with Friends and Family: Three times a week    Attends Religious Services: More than 4 times per year    Active Member of Clubs or Organizations: Yes    Attends Banker Meetings: More than 4 times per year    Marital Status: Married    No Known Allergies   Current Outpatient Medications:    enzalutamide (XTANDI) 40 MG capsule, 80 mg every evening., Disp: , Rfl:    melatonin 1 MG TABS tablet, Take 1 mg by mouth at bedtime as needed., Disp: , Rfl:    Multiple Vitamin (MULTIVITAMIN WITH MINERALS) TABS tablet, Take 1 tablet by mouth daily., Disp: , Rfl:    valsartan (DIOVAN) 80 MG tablet, Take 1 tablet (80 mg total) by mouth 2 (two) times daily., Disp: 60 tablet, Rfl: 5   Review of Systems  Constitutional:  Negative for activity change, appetite change, chills, diaphoresis, fatigue, fever and unexpected weight change.  HENT:  Negative for congestion, rhinorrhea, sinus pressure, sneezing, sore throat and trouble swallowing.   Eyes:  Negative for photophobia and visual disturbance.  Respiratory:  Negative for cough, chest tightness, shortness of breath, wheezing and stridor.   Cardiovascular:  Negative for chest pain, palpitations and leg swelling.  Gastrointestinal:  Negative for abdominal distention, abdominal pain, anal bleeding, blood in stool, constipation, diarrhea, nausea and vomiting.  Genitourinary:  Negative for difficulty urinating, dysuria, flank pain and hematuria.  Musculoskeletal:  Negative for  arthralgias, back pain, gait problem, joint swelling and myalgias.  Skin:  Negative for color change, pallor, rash and wound.  Neurological:  Positive for numbness. Negative for dizziness, tremors, weakness and light-headedness.  Hematological:  Negative for adenopathy. Does not bruise/bleed easily.  Psychiatric/Behavioral:  Negative for agitation, behavioral problems, confusion, decreased concentration, dysphoric mood and sleep disturbance.        Objective:   Physical Exam Constitutional:      Appearance: He is well-developed.  HENT:     Head: Normocephalic and atraumatic.  Eyes:     Conjunctiva/sclera: Conjunctivae normal.  Cardiovascular:     Rate and Rhythm: Normal rate and regular rhythm.  Pulmonary:     Effort: Pulmonary effort is  normal. No respiratory distress.     Breath sounds: No wheezing.  Abdominal:     General: There is no distension.     Palpations: Abdomen is soft.  Musculoskeletal:        General: No tenderness. Normal range of motion.     Cervical back: Normal range of motion and neck supple.  Skin:    General: Skin is warm and dry.     Coloration: Skin is not pale.     Findings: No erythema or rash.  Neurological:     General: No focal deficit present.     Mental Status: He is alert and oriented to person, place, and time.  Psychiatric:        Mood and Affect: Mood normal.        Behavior: Behavior normal.        Thought Content: Thought content normal.        Judgment: Judgment normal.           Assessment & Plan:   Assessment and Plan    Mycobacterium avium complex (MAC) infection Asymptomatic post-treatment with azithromycin and rifampin. Ethambutol discontinued due to suspected peripheral neuropathy. No current symptoms. Treatment not advised unless symptoms recur. Clofazimine considered if ethambutol intolerance persists. - Monitor for symptoms: persistent cough, weight loss, night sweats. - Schedule follow-up PRN if symptoms return. -  Consider clofazimine if ethambutol intolerance persists. - Return for evaluation in 6-12 months if asymptomatic.  Peripheral neuropathy Attributed to ethambutol, though rare and I remain highly skeptical that this was the cause.. No worsening post-discontinuation. Further evaluation suggested if needed. - Refer to primary care or neurologist if neuropathy persists or worsens.

## 2023-09-21 ENCOUNTER — Ambulatory Visit: Payer: PPO | Admitting: Physician Assistant

## 2023-09-21 ENCOUNTER — Encounter: Payer: Self-pay | Admitting: Physician Assistant

## 2023-09-21 VITALS — BP 122/68 | HR 72 | Ht 73.0 in | Wt 155.2 lb

## 2023-09-21 DIAGNOSIS — C61 Malignant neoplasm of prostate: Secondary | ICD-10-CM | POA: Diagnosis not present

## 2023-09-21 DIAGNOSIS — K5731 Diverticulosis of large intestine without perforation or abscess with bleeding: Secondary | ICD-10-CM | POA: Diagnosis not present

## 2023-09-21 DIAGNOSIS — Z9079 Acquired absence of other genital organ(s): Secondary | ICD-10-CM | POA: Diagnosis not present

## 2023-09-21 DIAGNOSIS — D649 Anemia, unspecified: Secondary | ICD-10-CM | POA: Diagnosis not present

## 2023-09-21 DIAGNOSIS — K639 Disease of intestine, unspecified: Secondary | ICD-10-CM

## 2023-09-21 DIAGNOSIS — K5792 Diverticulitis of intestine, part unspecified, without perforation or abscess without bleeding: Secondary | ICD-10-CM

## 2023-09-21 DIAGNOSIS — K625 Hemorrhage of anus and rectum: Secondary | ICD-10-CM

## 2023-09-21 NOTE — Patient Instructions (Addendum)
 You have been scheduled for a flexible sigmoidoscopy. Please follow the written instructions given to you at your visit today.  If you use inhalers (even only as needed), please bring them with you on the day of your procedure.  DO NOT TAKE 7 DAYS PRIOR TO TEST- Trulicity (dulaglutide) Ozempic, Wegovy (semaglutide) Mounjaro (tirzepatide) Bydureon Bcise (exanatide extended release)  DO NOT TAKE 1 DAY PRIOR TO YOUR TEST Rybelsus (semaglutide) Adlyxin (lixisenatide) Victoza (liraglutide) Byetta (exanatide) ___________________________________________________________________________ Due to recent changes in healthcare laws, you may see the results of your imaging and laboratory studies on MyChart before your provider has had a chance to review them.  We understand that in some cases there may be results that are confusing or concerning to you. Not all laboratory results come back in the same time frame and the provider may be waiting for multiple results in order to interpret others.  Please give [place] 48 hours in order for your provider to thoroughly review all the results before contacting the office for clarification of your results.  _______________________________________________________  If your blood pressure at your visit was 140/90 or greater, please contact your primary care physician to follow up on this.  _______________________________________________________  If you are age 62 or older, your body mass index should be between 23-30. Your Body mass index is 20.48 kg/m. If this is out of the aforementioned range listed, please consider follow up with your Primary Care Provider.  If you are age 84 or younger, your body mass index should be between 19-25. Your Body mass index is 20.48 kg/m. If this is out of the aformentioned range listed, please consider follow up with your Primary Care Provider.   ________________________________________________________  The [facility] providers  would like to encourage you to use [facility] to communicate with providers for non-urgent requests or questions.  Due to long hold times on the telephone, sending your provider a message by [facility] may be a faster and more efficient way to get a response.  Please allow 48 business hours for a response.  Please remember that this is for non-urgent requests.  _______________________________________________________    Eather Colas is an osmotic laxative.  It only brings more water into the stool.  This is safe to take daily.  Can take up to 17 gram of miralax twice a day.  Mix with juice or coffee.  Start 1 capful at night for 3-4 days and reassess your response in 3-4 days.  You can increase and decrease the dose based on your response.  Remember, it can take up to 3-4 days to take effect OR for the effects to wear off.   Toileting tips to help with your constipation - Drink at least 64-80 ounces of water/liquid per day. - Establish a time to try to move your bowels every day.  For many people, this is after a cup of coffee or after a meal such as breakfast. - Sit all of the way back on the toilet keeping your back fairly straight and while sitting up, try to rest the tops of your forearms on your upper thighs.   - Raising your feet with a step stool/squatty potty can be helpful to improve the angle that allows your stool to pass through the rectum. - Relax the rectum feeling it bulge toward the toilet water.  If you feel your rectum raising toward your body, you are contracting rather than relaxing. - Breathe in and slowly exhale. "Belly breath" by expanding your belly towards your belly  button. Keep belly expanded as you gently direct pressure down and back to the anus.  A low pitched GRRR sound can assist with increasing intra-abdominal pressure.  (Can also trying to blow on a pinwheel and make it move, this helps with the same belly breathing) - Repeat 3-4 times. If unsuccessful, contract the pelvic  floor to restore normal tone and get off the toilet.  Avoid excessive straining. - To reduce excessive wiping by teaching your anus to normally contract, place hands on outer aspect of knees and resist knee movement outward.  Hold 5-10 second then place hands just inside of knees and resist inward movement of knees.  Hold 5 seconds.  Repeat a few times each way.  Go to the ER if unable to pass gas, severe AB pain, unable to hold down food, any shortness of breath of chest pain.  Pelvic Floor Dysfunction, Male     Pelvic floor dysfunction (PFD) is a condition that results when the group of muscles and connective tissues that support the organs in the pelvis (pelvic floor muscles) do not work well. These muscles and their connections form a sling that supports the colon and bladder. In men, these muscles also support the prostate gland. PFD causes pelvic floor muscles to be too weak, too tight, or both. In PFD, muscle movements are not coordinated. This may cause bowel or bladder problems. It may also cause pain. What are the causes? This condition may be caused by an injury to the pelvic area or by a weakening of pelvic muscles. In many cases, the exact cause is not known. What increases the risk? The following factors may make you more likely to develop PFD: Having chronic bladder tissue inflammation (interstitial cystitis). Being an older person. Being overweight. History of radiation treatment for cancer in the pelvic region. Previous pelvic surgery, such as removal of the prostate gland (prostatectomy). What are the signs or symptoms? Symptoms of this condition vary and may include: Bladder symptoms, such as: Trouble starting urination and emptying the bladder. Frequent urinary tract infections. Leaking urine when coughing, laughing, or exercising (stress incontinence). Having to pass urine urgently or frequently. Pain when passing urine. Bowel symptoms, such as: Constipation. Urgent  or frequent bowel movements. Incomplete bowel movements. Painful bowel movements. Leaking stool or gas. Unexplained genital or rectal pain. Genital or rectal muscle spasms. Low back pain. Sexual dysfunction, such as erectile dysfunction, premature ejaculation, or pain during or after sexual activity. How is this diagnosed? This condition is diagnosed based on: Your symptoms and medical history. A physical exam. During the exam, your health care provider may check your pelvic muscles for tightness, spasm, pain, or weakness. This may include a rectal exam. In some cases, you may have diagnostic tests, such as: Electrical muscle function tests. Urine flow testing. X-ray tests of bowel function. Ultrasound of the pelvic organs. How is this treated? Treatment for this condition depends on your symptoms. Treatment options include: Physical therapy. This may include Kegel exercises to help relax or strengthen the pelvic floor muscles. Biofeedback. This type of therapy provides feedback on how tight your pelvic floor muscles are so that you can learn to control them. Massage therapy. A treatment that involves electrical stimulation of the pelvic floor muscles to help control pain (transcutaneous electrical nerve stimulation, or TENS). Sound wave therapy (ultrasound) to reduce muscle spasms. Medicines, such as: Muscle relaxants. Bladder control medicines. Surgery to reconstruct or support pelvic floor muscles may be an option if other treatments do  not help. Follow these instructions at home: Activity Do your usual activities as told by your health care provider. Ask your health care provider if you should modify any activities. Do pelvic floor strengthening or relaxing exercises at home as told by your physical therapist. Lifestyle Maintain a healthy weight. Eat foods that are high in fiber, such as beans, whole grains, and fresh fruits and vegetables. Limit foods that are high in fat and  processed sugars, such as fried or sweet foods. Manage stress with relaxation techniques such as yoga or meditation. General instructions If you have problems with leakage: Use absorbable pads or wear padded underwear. Wash your genital and anal area frequently with mild soap. Keep your genital and anal area as clean and dry as possible. Ask your health care provider if you should try a barrier cream to prevent skin irritation. Take warm baths to relieve pelvic muscle tension or spasms. Take over-the-counter and prescription medicines only as told by your health care provider. Keep all follow-up visits. How is this prevented? The cause of PFD is not always known, but there are a few things you can do to reduce the risk of developing this condition, including: Staying at a healthy weight. Getting regular exercise. Managing stress. Contact a health care provider if: Your symptoms are not improving with home care. You have signs or symptoms of PFD that get worse. You develop new signs or symptoms. You have signs of a urinary tract infection, such as: Fever. Chills. Increased urinary frequency. A burning feeling when urinating. You have not had a bowel movement in 3 days (constipation). Summary Pelvic floor dysfunction results when the muscles and connective tissues in your pelvic floor do not work well. These muscles and their connections form a sling that supports your colon and bladder. In men, these muscles also support the prostate gland. PFD may be caused by an injury to the pelvic area or by a weakening of pelvic muscles. PFD causes pelvic floor muscles to be too weak, too tight, or a combination of both. Symptoms may vary from person to person. In most cases, PFD can be treated with physical therapies and medicines. Surgery may be an option if other treatments do not help. This information is not intended to replace advice given to you by your health care provider. Make sure you  discuss any questions you have with your health care provider. Document Revised: 10/21/2020 Document Reviewed: 10/21/2020 Elsevier Patient Education  2024 [employer].

## 2023-09-21 NOTE — Progress Notes (Signed)
 09/21/2023 Juan Lin [id number] July 11, 1940  Referring provider: Shelva Majestic, MD Primary GI doctor: Dr. Chales Abrahams (Dr. Juanda Chance)  ASSESSMENT AND PLAN:   Diverticulosis with recent GI bleed  07/07/2023 CT scan showed sigmoid thickening and stranding suggestive of inflammation. No abdominal pain, nausea, or vomiting.  2016 colonoscopy Dr. Juanda Chance, moderate diverticulosis Still having some incomplete Bm's, constipation, some AB discomfort but no further blood -Most likely this is diverticular bleed versus scad versus rule out malignancy, discussed colon versus flex sig, he does not want a colonoscopy, will plan on flex sigmoidoscopy in the [facility] - possible component of pelvis floor dysfunction with history and symptoms.  Will treat with miralax/fiber, squatty potty. May have to add on stimulant.  Consider pelvic floor PT  Anemia 07/13/2023  HGB 13.2 MCV 93.6 Platelets 281.0 07/11/2023 Iron 59 Ferritin 132.6 B12 534 No further anemia from last labs Recent Labs    09/22/22 1404 07/06/23 2317 07/07/23 0415 07/07/23 1702 07/08/23 0517 07/09/23 0516 07/11/23 0956 07/13/23 0959  HGB 14.7 14.2 11.7* 11.8* 11.6* 11.6* 13.3 13.2   Prostate Cancer S/p proctectomy in the 90's Patient is currently on Xtandi for prostate cancer management. Reports fatigue as a side effect. -Continue current management.   Patient Care Team: Shelva Majestic, MD as PCP - General (Family Medicine) Genia Del, Daisy Blossom, MD as Consulting Physician (Ophthalmology) Jerilee Field, MD as Consulting Physician (Urology) Memory Argue, Wilson Singer, MD as Consulting Physician (Audiology)  HISTORY OF PRESENT ILLNESS: 83 y.o. male with a past medical history of  prostate cancer status post radical prostatectomy, history of colonic polyp, basal cell cancer, superficial thrombophlebitis, chronic superficial DVT of the left leg (not on anticoagulation), essential hypertension, hyperlipidemia and aortic  atherosclerosis and others listed below presents for hospital follow-up  08/15/2014 colonoscopy with Dr. Juanda Chance good prep with movie prep showed moderate diverticulosis no recall due to age Patient was admitted 1/10 through 1/12 for 3 episodes of bright red bleeding hemoglobin dropped to 3 units from 14--->11.7 CTA shows no evidence of active GI bleeding wall thickening around sigmoid with extensive diverticulosis possible mild diverticulitis.  Treated with ABX 07/06/2023  HGB 11.6 MCV 91.3 Platelets 243 B12 534  Discussed the use of AI scribe software for clinical note transcription with the patient, who gave verbal consent to proceed.  History of Present Illness   Juan Lin "Juan Lin" is an 83 year old male with a history of diverticulitis who presents with bowel movement irregularities and incomplete evacuation.  He has experienced a change in bowel habits since his last hospital visit, characterized by bowel movement irregularities and a sensation of incomplete evacuation. Stools are now small, finger-sized, and brown, differing from the previous larger, black stools. He often feels incomplete evacuation, accompanied by a 'big fart' and a spray of black dots in the toilet bowl.  Benefiber, previously effective for constipation, is now less helpful. He occasionally experiences a sensation of wetness and minor fecal leakage, though not enough to soil his underwear. He questions whether these symptoms are related to his previous diverticulitis, which was located in the sigmoid colon.  No current bleeding, noting that previous episodes involved bright or dark red blood. Discomfort is present during bowel movements, with a sensation of something not moving properly through his colon. He has not undergone a colonoscopy since 2016.  He has a history of prostate cancer, treated with prostate removal in 1994. Urinary symptoms include a weak stream and the need to 'squirt out the  last batch' to prevent  leakage. No chest pain or shortness of breath.   He  reports that he has never smoked. He has never used smokeless tobacco. He reports current alcohol use of about 7.0 standard drinks of alcohol per week. He reports that he does not currently use drugs after having used the following drugs: Amyl nitrate and IV.  Wt Readings from Last 3 Encounters:  09/21/23 155 lb 4 oz (70.4 kg)  09/18/23 155 lb (70.3 kg)  07/17/23 147 lb 6.4 oz (66.9 kg)    RELEVANT LABS AND IMAGING:  CT scan 01/09 IMPRESSION: 1. No evidence of active GI bleeding. 2. Wall thickening about the sigmoid colon where there is extensive diverticulosis. Minimal if any adjacent stranding. Findings could represent mild diverticulitis.  Colonoscopy 2016 with Dr. Juanda Chance - Moderate diverticulosis throughout entire examined colon - No recall due to age   Colonoscopy 2011 - Severe diverticulosis in sigmoid and descending colon - Sessile polyp in cecum (0.5 cm).  Pathology showed benign colonic mucosa  CBC    Component Value Date/Time   WBC 6.5 07/13/2023 0959   RBC 4.19 (L) 07/13/2023 0959   HGB 13.2 07/13/2023 0959   HCT 39.2 07/13/2023 0959   PLT 281.0 07/13/2023 0959   MCV 93.6 07/13/2023 0959   MCH 31.6 07/06/2023 2317   MCHC 33.7 07/13/2023 0959   RDW 13.3 07/13/2023 0959   LYMPHSABS 1.9 07/13/2023 0959   MONOABS 0.5 07/13/2023 0959   EOSABS 0.2 07/13/2023 0959   BASOSABS 0.1 07/13/2023 0959   Recent Labs    09/22/22 1404 07/06/23 2317 07/07/23 0415 07/07/23 1702 07/08/23 0517 07/09/23 0516 07/11/23 0956 07/13/23 0959  HGB 14.7 14.2 11.7* 11.8* 11.6* 11.6* 13.3 13.2    CMP     Component Value Date/Time   NA 134 (L) 07/13/2023 0959   K 4.2 07/13/2023 0959   CL 99 07/13/2023 0959   CO2 27 07/13/2023 0959   GLUCOSE 81 07/13/2023 0959   BUN 24 (H) 07/13/2023 0959   CREATININE 1.12 07/13/2023 0959   CREATININE 0.90 09/22/2022 1404   CALCIUM 9.2 07/13/2023 0959   PROT 7.3 07/13/2023 0959    ALBUMIN 4.1 07/13/2023 0959   AST 16 07/13/2023 0959   ALT 9 07/13/2023 0959   ALKPHOS 44 07/13/2023 0959   BILITOT 0.6 07/13/2023 0959   GFRNONAA 56 (L) 07/07/2023 0415   GFRAA  06/04/2007 0946    >60        The eGFR has been calculated using the MDRD equation. This calculation has not been validated in all clinical      Latest Ref Rng & Units 07/13/2023    9:59 AM 07/11/2023    9:56 AM 07/07/2023    4:15 AM  Hepatic Function  Total Protein 6.0 - 8.3 g/dL 7.3  7.3  6.0   Albumin 3.5 - 5.2 g/dL 4.1  4.1  3.0   AST 0 - 37 U/L 16  14  15    ALT 0 - 53 U/L 9  9  12    Alk Phosphatase 39 - 117 U/L 44  47  44   Total Bilirubin 0.2 - 1.2 mg/dL 0.6  0.6  0.8       Current Medications:    Current Outpatient Medications (Cardiovascular):    valsartan (DIOVAN) 80 MG tablet, Take 1 tablet (80 mg total) by mouth 2 (two) times daily.  Current Outpatient Medications (Respiratory):    Cetirizine HCl 10 MG CAPS,     Current  Outpatient Medications (Other):    enzalutamide (XTANDI) 40 MG capsule, 80 mg every evening.   melatonin 1 MG TABS tablet, Take 1 mg by mouth at bedtime as needed.   Multiple Vitamin (MULTIVITAMIN WITH MINERALS) TABS tablet, Take 1 tablet by mouth daily.  Medical History:  Past Medical History:  Diagnosis Date   Adenoma 2008   serrated adenoma of colon   Diverticulosis    GERD (gastroesophageal reflux disease)    H. Pylori related-gone when treated   Hyperlipidemia    Prostate cancer [facility]) 1994   radical prostatectomy Dr. Richardo Priest   Right inguinal hernia    Seasonal allergies    Sinusitis acute    Allergies: No Known Allergies   Surgical History:  He  has a past surgical history that includes prostatectomy (1994); Appendectomy; Tonsillectomy; Cholecystectomy (05/2009); Cataract extraction, bilateral (Bilateral, 2018); and Inguinal hernia repair (Right, 04/13/2020). Family History:  His family history includes Other (age of onset: 56) in an other family  member; Pneumonia in his father; Prostate cancer in his cousin and paternal uncle.  REVIEW OF SYSTEMS  : All other systems reviewed and negative except where noted in the History of Present Illness.  PHYSICAL EXAM: BP 122/68   Pulse 72   Ht 6\' 1"  (1.854 m)   Wt 155 lb 4 oz (70.4 kg)   SpO2 97%   BMI 20.48 kg/m  General Appearance: Thin appearing, in no apparent distress. Head:   Normocephalic and atraumatic. Eyes:  sclerae anicteric,conjunctive pink  Respiratory: Respiratory effort normal, BS equal bilaterally without rales, rhonchi, wheezing. Cardio: RRR with no MRGs. Peripheral pulses intact.  Abdomen: Soft,  Non-distended ,active bowel sounds. No tenderness . Without guarding and Without rebound. No masses. Rectal: declines Musculoskeletal: Full ROM, Normal gait. Without edema. Skin:  Dry and intact without significant lesions or rashes Neuro: Alert and  oriented x4;  No focal deficits. Psych:  Cooperative. Normal mood and affect.    Doree Albee, PA-C 3:24 PM

## 2023-10-05 ENCOUNTER — Ambulatory Visit: Admitting: Gastroenterology

## 2023-10-05 ENCOUNTER — Encounter: Payer: Self-pay | Admitting: Gastroenterology

## 2023-10-05 VITALS — BP 116/78 | HR 63 | Temp 97.2°F | Resp 14 | Ht 73.0 in | Wt 155.4 lb

## 2023-10-05 DIAGNOSIS — K640 First degree hemorrhoids: Secondary | ICD-10-CM

## 2023-10-05 DIAGNOSIS — K625 Hemorrhage of anus and rectum: Secondary | ICD-10-CM

## 2023-10-05 DIAGNOSIS — K639 Disease of intestine, unspecified: Secondary | ICD-10-CM

## 2023-10-05 DIAGNOSIS — K5792 Diverticulitis of intestine, part unspecified, without perforation or abscess without bleeding: Secondary | ICD-10-CM

## 2023-10-05 DIAGNOSIS — K5730 Diverticulosis of large intestine without perforation or abscess without bleeding: Secondary | ICD-10-CM | POA: Diagnosis not present

## 2023-10-05 DIAGNOSIS — K5732 Diverticulitis of large intestine without perforation or abscess without bleeding: Secondary | ICD-10-CM | POA: Diagnosis not present

## 2023-10-05 MED ORDER — SODIUM CHLORIDE 0.9 % IV SOLN: 500.0000 mL | Freq: Once | INTRAVENOUS | Status: DC

## 2023-10-05 NOTE — Patient Instructions (Signed)
   Handouts provided about hemorrhoids and diverticulosis.  Use Benefiber 1 teaspoon oral daily with 8 ounces of water.  Preparation H ointment:  Apply externally twice a day as needed for 7-10 days.  Follow up in GI clinic in case of any problems.    YOU HAD AN ENDOSCOPIC PROCEDURE TODAY AT THE [facility]:   Refer to the procedure report that was given to you for any specific questions about what was found during the examination.  If the procedure report does not answer your questions, please call your gastroenterologist to clarify.  If you requested that your care partner not be given the details of your procedure findings, then the procedure report has been included in a sealed envelope for you to review at your convenience later.  YOU SHOULD EXPECT: Some feelings of bloating in the abdomen. Passage of more gas than usual.  Walking can help get rid of the air that was put into your GI tract during the procedure and reduce the bloating. If you had a lower endoscopy (such as a colonoscopy or flexible sigmoidoscopy) you may notice spotting of blood in your stool or on the toilet paper. If you underwent a bowel prep for your procedure, you may not have a normal bowel movement for a few days.  Please Note:  You might notice some irritation and congestion in your nose or some drainage.  This is from the oxygen used during your procedure.  There is no need for concern and it should clear up in a day or so.  SYMPTOMS TO REPORT IMMEDIATELY:  Following lower endoscopy (colonoscopy or flexible sigmoidoscopy):  Excessive amounts of blood in the stool  Significant tenderness or worsening of abdominal pains  Swelling of the abdomen that is new, acute  Fever of 100F or higher  For urgent or emergent issues, a gastroenterologist can be reached at any hour by calling [phone]. Do not use MyChart messaging for urgent concerns.    DIET:  We do recommend a small meal at first, but then  you may proceed to your regular diet.  Drink plenty of fluids but you should avoid alcoholic beverages for 24 hours.  ACTIVITY:  You should plan to take it easy for the rest of today and you should NOT DRIVE or use heavy machinery until tomorrow (because of the sedation medicines used during the test).    FOLLOW UP: Our staff will call the number listed on your records the next business day following your procedure.  We will call around 7:15- 8:00 am to check on you and address any questions or concerns that you may have regarding the information given to you following your procedure. If we do not reach you, we will leave a message.     If any biopsies were taken you will be contacted by phone or by letter within the next 1-3 weeks.  Please call us at [phone] if you have not heard about the biopsies in 3 weeks.    SIGNATURES/CONFIDENTIALITY: You and/or your care partner have signed paperwork which will be entered into your electronic medical record.  These signatures attest to the fact that that the information above on your After Visit Summary has been reviewed and is understood.  Full responsibility of the confidentiality of this discharge information lies with you and/or your care-partner.

## 2023-10-05 NOTE — Op Note (Signed)
 [facility] Patient Name: Juan Lin Procedure Date: 10/05/2023 2:16 PM MRN: [id number] Endoscopist: Lynann Bologna , MD, [id number] Age: 83 Referring MD:  Date of Birth: Jul 03, 1940 Gender: Male Account #: [id number] Procedure:                Flexible Sigmoidoscopy Indications:              Abnormal CT of the GI tract showing sigmoid wall                            thickening. Neg colonoscopy in 2016 by Dr. Juanda Chance.                            Pt would like to get flexible sigmoidoscopy                            performed. Medicines:                Propofol per Anesthesia Procedure:                Pre-Anesthesia Assessment:                           - Prior to the procedure, a History and Physical                            was performed, and patient medications and                            allergies were reviewed. The patient's tolerance of                            previous anesthesia was also reviewed. The risks                            and benefits of the procedure and the sedation                            options and risks were discussed with the patient.                            All questions were answered, and informed consent                            was obtained. Prior Anticoagulants: The patient has                            taken no anticoagulant or antiplatelet agents. ASA                            Grade Assessment: II - A patient with mild systemic                            disease. After reviewing the risks and benefits,  the patient was deemed in satisfactory condition to                            undergo the procedure.                           After obtaining informed consent, the scope was                            passed under direct vision. The Olympus Scope SN                            [id number] was introduced through the anus and                            advanced to the the splenic flexure (60 cm).                             Stopped due to retained stool. The flexible                            sigmoidoscopy was accomplished without difficulty.                            The patient tolerated the procedure well. The                            quality of the bowel preparation was good. Scope In: 2:34:20 PM Scope Out: 2:40:53 PM Total Procedure Duration: 0 hours 6 minutes 33 seconds  Findings:                 Hemorrhoids were found on perianal exam.                           Multiple medium-mouthed diverticula were found in                            the sigmoid colon and few in descending colon.                            There was evidence of muscular hypertrophy in the                            sigmoid colon. Few diverticula with stool impacted.                            No endoscopic evidence of diverticulitis. No masses.                           Non-bleeding internal hemorrhoids were found during                            retroflexion. The hemorrhoids were moderate and  Grade I (internal hemorrhoids that do not prolapse).                           The exam was otherwise without abnormality. Complications:            No immediate complications. Estimated Blood Loss:     Estimated blood loss: none. Impression:               - Hemorrhoids found on perianal exam.                           - Left colonic diverticulosis predominantly in the                            sigmoid colon.                           - Non-bleeding internal hemorrhoids.                           - The examination was otherwise normal.                           - No specimens collected. Recommendation:           - Use Benefiber one teaspoon PO daily with 8 ounces                            of water.                           - Preparation H ointment: Apply externally BID PRN                            x 7 to 10 days.                           - FU GI clinic in case of any problems.                            - The findings and recommendations were discussed                            with the patient's family. Lynann Bologna, MD 10/05/2023 2:47:02 PM This report has been signed electronically.

## 2023-10-05 NOTE — Progress Notes (Signed)
 Pt's states no medical or surgical changes since previsit or office visit.

## 2023-10-05 NOTE — Progress Notes (Signed)
 Report to PACU, RN, vss, BBS= Clear.

## 2023-10-05 NOTE — Progress Notes (Signed)
 09/21/2023 Juan Lin [id number] [birth year]   Referring provider: Shelva Majestic, MD Primary GI doctor: Dr. Chales Abrahams (Dr. Juanda Chance)   ASSESSMENT AND PLAN:    Diverticulosis with recent GI bleed  07/07/2023 CT scan showed sigmoid thickening and stranding suggestive of inflammation. No abdominal pain, nausea, or vomiting.  2016 colonoscopy Dr. Juanda Chance, moderate diverticulosis Still having some incomplete Bm's, constipation, some AB discomfort but no further blood -Most likely this is diverticular bleed versus scad versus rule out malignancy, discussed colon versus flex sig, he does not want a colonoscopy, will plan on flex sigmoidoscopy in the [facility] - possible component of pelvis floor dysfunction with history and symptoms.  Will treat with miralax/fiber, squatty potty. May have to add on stimulant.  Consider pelvic floor PT   Anemia 07/13/2023  HGB 13.2 MCV 93.6 Platelets 281.0 07/11/2023 Iron 59 Ferritin 132.6 B12 534 No further anemia from last labs Recent Labs (within last 365 days)            Recent Labs    09/22/22 1404 07/06/23 2317 07/07/23 0415 07/07/23 1702 07/08/23 0517 07/09/23 0516 07/11/23 0956 07/13/23 0959  HGB 14.7 14.2 11.7* 11.8* 11.6* 11.6* 13.3 13.2      Prostate Cancer S/p proctectomy in the 90's Patient is currently on Xtandi for prostate cancer management. Reports fatigue as a side effect. -Continue current management.     Patient Care Team: Shelva Majestic, MD as PCP - General (Family Medicine) Genia Del, Daisy Blossom, MD as Consulting Physician (Ophthalmology) Jerilee Field, MD as Consulting Physician (Urology) Memory Argue, Wilson Singer, MD as Consulting Physician (Audiology)   HISTORY OF PRESENT ILLNESS: 83 y.o. 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 Juanda Chance good prep with movie prep showed moderate diverticulosis no recall due to age Patient was admitted 1/10 through 1/12 for 3 episodes of bright red bleeding hemoglobin dropped to 3 units from 14--->11.7 CTA shows no evidence of active GI bleeding wall thickening around sigmoid with extensive diverticulosis possible mild diverticulitis.  Treated with ABX 07/06/2023  HGB 11.6 MCV 91.3 Platelets 243 B12 534   Discussed the use of AI scribe software for clinical note transcription with the patient, who gave verbal consent to proceed.   History of Present Illness   Juan Lin "Juan Lin" is an 83 year old male with a history of diverticulitis who presents with bowel movement irregularities and incomplete evacuation.   He has experienced a change in bowel habits since his last hospital visit, characterized by bowel movement irregularities and a sensation of incomplete evacuation. Stools are now small, finger-sized, and brown, differing from the previous larger, black stools. He often feels incomplete evacuation, accompanied by a 'big fart' and a spray of black dots in the toilet bowl.   Benefiber, previously effective for constipation, is now less helpful. He occasionally experiences a sensation of wetness and minor fecal leakage, though not enough to soil his underwear. He questions whether these symptoms are related to his previous diverticulitis, which was located in the sigmoid colon.   No current bleeding, noting that previous episodes involved bright or dark red blood. Discomfort is present during bowel movements, with a sensation of something not moving properly through his colon. He has not  undergone a colonoscopy since 2016.   He has a history of prostate cancer, treated with prostate removal in 1994. Urinary symptoms include a weak  stream and the need to 'squirt out the last batch' to prevent leakage. No chest pain or shortness of breath.    He  reports that he has never smoked. He has never used smokeless tobacco. He reports current alcohol use of about 7.0 standard drinks of alcohol per week. He reports that he does not currently use drugs after having used the following drugs: Amyl nitrate and IV.      Wt Readings from Last 3 Encounters:  09/21/23 155 lb 4 oz (70.4 kg)  09/18/23 155 lb (70.3 kg)  07/17/23 147 lb 6.4 oz (66.9 kg)      RELEVANT LABS AND IMAGING:   CT scan 01/09 IMPRESSION: 1. No evidence of active GI bleeding. 2. Wall thickening about the sigmoid colon where there is extensive diverticulosis. Minimal if any adjacent stranding. Findings could represent mild diverticulitis.   Colonoscopy 2016 with Dr. Juanda Chance - Moderate diverticulosis throughout entire examined colon - No recall due to age   Colonoscopy 2011 - Severe diverticulosis in sigmoid and descending colon - Sessile polyp in cecum (0.5 cm).  Pathology showed benign colonic mucosa   CBC Labs (Brief)          Component Value Date/Time    WBC 6.5 07/13/2023 0959    RBC 4.19 (L) 07/13/2023 0959    HGB 13.2 07/13/2023 0959    HCT 39.2 07/13/2023 0959    PLT 281.0 07/13/2023 0959    MCV 93.6 07/13/2023 0959    MCH 31.6 07/06/2023 2317    MCHC 33.7 07/13/2023 0959    RDW 13.3 07/13/2023 0959    LYMPHSABS 1.9 07/13/2023 0959    MONOABS 0.5 07/13/2023 0959    EOSABS 0.2 07/13/2023 0959    BASOSABS 0.1 07/13/2023 0959      Recent Labs (within last 365 days)            Recent Labs    09/22/22 1404 07/06/23 2317 07/07/23 0415 07/07/23 1702 07/08/23 0517 07/09/23 0516 07/11/23 0956 07/13/23 0959  HGB 14.7 14.2 11.7* 11.8* 11.6* 11.6* 13.3 13.2        CMP     Labs (Brief)           Component Value Date/Time    NA 134 (L) 07/13/2023 0959    K 4.2 07/13/2023 0959    CL 99 07/13/2023 0959    CO2 27 07/13/2023 0959     GLUCOSE 81 07/13/2023 0959    BUN 24 (H) 07/13/2023 0959    CREATININE 1.12 07/13/2023 0959    CREATININE 0.90 09/22/2022 1404    CALCIUM 9.2 07/13/2023 0959    PROT 7.3 07/13/2023 0959    ALBUMIN 4.1 07/13/2023 0959    AST 16 07/13/2023 0959    ALT 9 07/13/2023 0959    ALKPHOS 44 07/13/2023 0959    BILITOT 0.6 07/13/2023 0959    GFRNONAA 56 (L) 07/07/2023 0415    GFRAA   06/04/2007 0946      >60        The eGFR has been calculated using the MDRD equation. This calculation has not been validated in all clinical          Latest Ref Rng & Units 07/13/2023    9:59 AM 07/11/2023    9:56 AM 07/07/2023    4:15 AM  Hepatic Function  Total Protein  6.0 - 8.3 g/dL 7.3  7.3  6.0   Albumin 3.5 - 5.2 g/dL 4.1  4.1  3.0   AST 0 - 37 U/L 16  14  15    ALT 0 - 53 U/L 9  9  12    Alk Phosphatase 39 - 117 U/L 44  47  44   Total Bilirubin 0.2 - 1.2 mg/dL 0.6  0.6  0.8       Current Medications:      Current Outpatient Medications (Cardiovascular):    valsartan (DIOVAN) 80 MG tablet, Take 1 tablet (80 mg total) by mouth 2 (two) times daily.   Current Outpatient Medications (Respiratory):    Cetirizine HCl 10 MG CAPS,        Current Outpatient Medications (Other):    enzalutamide (XTANDI) 40 MG capsule, 80 mg every evening.   melatonin 1 MG TABS tablet, Take 1 mg by mouth at bedtime as needed.   Multiple Vitamin (MULTIVITAMIN WITH MINERALS) TABS tablet, Take 1 tablet by mouth daily.   Medical History:      Past Medical History:  Diagnosis Date   Adenoma 2008    serrated adenoma of colon   Diverticulosis     GERD (gastroesophageal reflux disease)      H. Pylori related-gone when treated   Hyperlipidemia     Prostate cancer [facility]) 1994    radical prostatectomy Dr. Richardo Priest   Right inguinal hernia     Seasonal allergies     Sinusitis acute          Allergies:  Allergies  No Known Allergies      Surgical History:  He  has a past surgical history that includes  prostatectomy (1994); Appendectomy; Tonsillectomy; Cholecystectomy (05/2009); Cataract extraction, bilateral (Bilateral, 2018); and Inguinal hernia repair (Right, 04/13/2020). Family History:  His family history includes Other (age of onset: 33) in an other family member; Pneumonia in his father; Prostate cancer in his cousin and paternal uncle.   REVIEW OF SYSTEMS  : All other systems reviewed and negative except where noted in the History of Present Illness.   PHYSICAL EXAM: BP 122/68   Pulse 72   Ht 6\' 1"  (1.854 m)   Wt 155 lb 4 oz (70.4 kg)   SpO2 97%   BMI 20.48 kg/m  General Appearance: Thin appearing, in no apparent distress. Head:   Normocephalic and atraumatic. Eyes:  sclerae anicteric,conjunctive pink  Respiratory: Respiratory effort normal, BS equal bilaterally without rales, rhonchi, wheezing. Cardio: RRR with no MRGs. Peripheral pulses intact.  Abdomen: Soft,  Non-distended ,active bowel sounds. No tenderness . Without guarding and Without rebound. No masses. Rectal: declines Musculoskeletal: Full ROM, Normal gait. Without edema. Skin:  Dry and intact without significant lesions or rashes Neuro: Alert and  oriented x4;  No focal deficits. Psych:  Cooperative. Normal mood and affect.      Doree Albee, PA-C 3:24 PM     Attending physician's note   I have taken history, reviewed the chart and examined the patient. I performed a substantive portion of this encounter, including complete performance of at least one of the key components, in conjunction with the APP. I agree with the Advanced Practitioner's note, impression and recommendations.    For FS today.RE: abn CT showing sigmoid thickening. Neg colon 2016.    Edman Circle, MD [facility] [phone]

## 2023-10-06 ENCOUNTER — Telehealth: Payer: Self-pay

## 2023-10-06 NOTE — Telephone Encounter (Signed)
No answer on follow up call. 

## 2023-10-13 ENCOUNTER — Other Ambulatory Visit: Payer: Self-pay | Admitting: Family Medicine

## 2023-10-17 ENCOUNTER — Other Ambulatory Visit (HOSPITAL_COMMUNITY): Payer: Self-pay

## 2023-10-17 MED ORDER — CETIRIZINE HCL 10 MG PO TABS
10.0000 mg | ORAL_TABLET | Freq: Every day | ORAL | 1 refills | Status: AC
  Filled 2023-10-17: qty 100, 100d supply, fill #0

## 2023-10-23 ENCOUNTER — Other Ambulatory Visit (HOSPITAL_COMMUNITY): Payer: Self-pay

## 2023-11-02 DIAGNOSIS — C61 Malignant neoplasm of prostate: Secondary | ICD-10-CM | POA: Diagnosis not present

## 2023-11-28 ENCOUNTER — Encounter: Payer: Self-pay | Admitting: Pharmacist

## 2023-11-28 NOTE — Progress Notes (Signed)
 Pharmacy Quality Measure Review  This patient is appearing on a report for being at risk of failing the adherence measure for hypertension (ACEi/ARB) medications this calendar year.   Medication: valsartan  80mg  Last fill date: 09/14/2023 for 30 day supply - Directions are take 1 tablet twice a day on Rx.   Insurance report was not up to date. No action needed at this time.  - per Epic records valsartan  80mg  was filled for 30 days on 11/14/2023 by [facility].  Unfortunately this was after last impactable date so he will fail [facility] measure for 2025.  Attempted to outreach patient to verify direction for valsartan  and discuss adherence. LM on VM with CB [phone].   Cecilie Coffee, PharmD Clinical Pharmacist [facility] Primary Care  Population Health [phone]

## 2023-11-30 ENCOUNTER — Ambulatory Visit: Payer: Self-pay

## 2023-11-30 NOTE — Telephone Encounter (Signed)
 FYI Only or Action Required?: Action required by provider  Patient was last seen in primary care on 07/17/2023 by Almira Jaeger, MD. Called Nurse Triage reporting Hypertension. Symptoms began today checked BP and became aware, has not checked in several weeks so he is unsure how long is has been elevated. Interventions attempted: Prescription medications: Valsartan  80,g once daily. Symptoms are: hypertension (BP this afternoon 150/100)stable as patient states he has been asymptomatic.  Triage Disposition: See PCP When Office is Open (Within 3 Days)  Patient/caregiver understands and will follow disposition?: No, wishes to speak with PCP              Copied from CRM [id number]. Topic: Clinical - Medication Question >> Nov 30, 2023  2:31 PM Alyse July wrote: Reason for CRM: Patient would like to know if he should increase his valsartan  (DIOVAN ) 80 MG tablet. Contact Confirmed [phone] Reason for Disposition  Systolic BP  >= 160 OR Diastolic >= 100  Answer Assessment - Initial Assessment Questions 1. BLOOD PRESSURE: "What is the blood pressure?" "Did you take at least two measurements 5 minutes apart?"     150/100.  2. ONSET: "When did you take your blood pressure?"     This afternoon around 1 hour ago.  3. HOW: "How did you take your blood pressure?" (e.g., automatic home BP monitor, visiting nurse)     Automatic home BP monitor.  4. HISTORY: "Do you have a history of high blood pressure?"     Yes.  5. MEDICINES: "Are you taking any medicines for blood pressure?" "Have you missed any doses recently?"     Valsartan . He states he only been taking the Valsartan  once daily.  6. OTHER SYMPTOMS: "Do you have any symptoms?" (e.g., blurred vision, chest pain, difficulty breathing, headache, weakness)     Patient denies: chest pain, difficulty breathing, dizziness, blurry vision, headaches, unilateral numbness or weakness.  7. PREGNANCY: "Is there any chance you are pregnant?"  "When was your last menstrual period?"     N/A.  Protocols used: Blood Pressure - High-A-AH

## 2023-12-01 NOTE — Telephone Encounter (Signed)
 He can increase the blood pressure medicine to twice a day and schedule him for follow-up within the next week or 2 to recheck in person-can bring his log of blood pressures with him

## 2023-12-01 NOTE — Telephone Encounter (Signed)
 Please see pt triage note and advise

## 2023-12-01 NOTE — Telephone Encounter (Signed)
 Patient called and verified, patient was informed of Dr. Lolita Rise advice, patient verbalized understanding. Patient has an appointment for there 24th at 4pm to monitor this. Patient was advised to take ihs blood pressure 4x a week leading up to his appointment and to call us  if not tolerating well.

## 2023-12-14 DIAGNOSIS — C61 Malignant neoplasm of prostate: Secondary | ICD-10-CM | POA: Diagnosis not present

## 2023-12-19 ENCOUNTER — Ambulatory Visit (INDEPENDENT_AMBULATORY_CARE_PROVIDER_SITE_OTHER): Admitting: Family Medicine

## 2023-12-19 ENCOUNTER — Other Ambulatory Visit (HOSPITAL_COMMUNITY): Payer: Self-pay

## 2023-12-19 ENCOUNTER — Encounter: Payer: Self-pay | Admitting: Family Medicine

## 2023-12-19 VITALS — BP 120/70 | HR 82 | Wt 149.8 lb

## 2023-12-19 DIAGNOSIS — C61 Malignant neoplasm of prostate: Secondary | ICD-10-CM

## 2023-12-19 DIAGNOSIS — I10 Essential (primary) hypertension: Secondary | ICD-10-CM

## 2023-12-19 DIAGNOSIS — G629 Polyneuropathy, unspecified: Secondary | ICD-10-CM | POA: Diagnosis not present

## 2023-12-19 DIAGNOSIS — A310 Pulmonary mycobacterial infection: Secondary | ICD-10-CM | POA: Diagnosis not present

## 2023-12-19 MED ORDER — VALSARTAN 80 MG PO TABS
80.0000 mg | ORAL_TABLET | Freq: Two times a day (BID) | ORAL | 3 refills | Status: AC
  Filled 2023-12-19: qty 180, 90d supply, fill #0
  Filled 2024-03-26: qty 180, 90d supply, fill #1

## 2023-12-19 NOTE — Progress Notes (Signed)
 Phone [phone] In person visit   Subjective:   Juan Lin is a 83 y.o. year old very pleasant male patient who presents for/with See problem oriented charting Chief Complaint  Patient presents with   Follow-up    Follow from last visit and discuss imaging results. He is still having some tingling in both feet.    Past Medical History-  Patient Active Problem List   Diagnosis Date Noted   Leg DVT (deep venous thromboembolism), chronic, left (HCC) 12/27/2022    Priority: High   Other secondary hypertension 11/29/2022    Priority: High   Prostate cancer [facility])     Priority: High   Elevated TSH 06/02/2014    Priority: Medium    HLD (hyperlipidemia) 03/10/2009    Priority: Medium    History of colon polyps     Priority: Low   Malignant basal cell neoplasm of skin 04/08/2013    Priority: Low   Allergic rhinitis 08/06/2007    Priority: Low   GI bleed 07/07/2023   History of DVT (deep vein thrombosis) 07/07/2023   Essential hypertension 07/07/2023   History of prostate cancer 07/07/2023   AKI (acute kidney injury) (HCC) 07/07/2023   Hyponatremia 07/07/2023   Hyperbilirubinemia 07/07/2023   Acute diverticulitis 07/07/2023   Bright red rectal bleeding 07/07/2023   Aortic atherosclerosis (HCC) 12/09/2022   H/O radical prostatectomy 08/18/2021   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 100 tablet 1   enzalutamide (XTANDI) 40 MG capsule 80 mg every evening.     melatonin 1 MG TABS tablet Take 1 mg by mouth at bedtime as needed.     Multiple Vitamin (MULTIVITAMIN WITH MINERALS) TABS tablet Take 1 tablet by mouth daily.     valsartan  (DIOVAN ) 80 MG tablet Take 1  tablet (80 mg total) by mouth 2 (two) times daily. 180 tablet 3   No current facility-administered medications for this visit.     Objective:  BP 120/70   Pulse 82   Wt 149 lb 12.8 oz (67.9 kg)   SpO2 97%   BMI 19.76 kg/m  Gen: NAD, resting comfortably CV: RRR no murmurs rubs or gallops Lungs: CTAB no crackles, wheeze, rhonchi Ext: minimal edema Skin: warm, dry    Assessment and Plan   # Prostate cancer S: Patient remains on Xtandi but down to 1 now- waiting on results from most recent PSA A/P: Hopefully remains well treated with reduced dose of Xtandi-waiting on PSA results-continue current treatment and urology follow-up-his blood pressure response appears to be related to this treatment-no history of hypertension prior to treatment   #hypertension S: medication: Valsartan  80 mg twice daily-had only been on once a day but blood pressure increased to around 150/100 at home before increasing back to twice daily Home readings #s: home average 132/87 on most recent readings since going to twice daily again BP Readings from Last 3 Encounters:  12/19/23 120/70  10/05/23 116/78  09/21/23 122/68  A/P: Blood pressure well-controlled-continue current medication.  Diastolic readings at home slightly high but looks excellent today so we will wait on these results   # MAI/neuropathy S: Symptoms started on Ethambutol  (rarely associated) and continued even after discontinuation- no worse  and perhaps slightly better -Completed course of treatment for MAI completely -6 to 80-month follow-up if asymptomatic sooner if symptomatic A/P: MAI appears adequately treated and has appropriate infectious disease follow-up.  No recurrent symptoms.  Does have possible neuropathy related to ethambutol  but seems to have improved slightly as he gets further out from treatment  #MCL tear with dancing on left knee- ibuprofen helpful but discussed can raise blood pressure so would make notes if taking  # GI  placed on Benefiber and notes smaller segments of feces-we discussed this is not a major concern  # Elevated TSH-very mild and preferred to wait until next visit to check again   Recommended follow up: Return for next already scheduled visit or sooner if needed. Future Appointments  Date Time Provider Department Center  07/18/2024  2:00 PM [name], MD [facility]   Lab/Order associations:   ICD-10-CM   1. Essential hypertension  I10     2. Prostate cancer (HCC)  C61     3. Mycobacterium avium infection (HCC)  A31.0     4. Neuropathy  G62.9       Meds ordered this encounter  Medications   valsartan  (DIOVAN ) 80 MG tablet    Sig: Take 1 tablet (80 mg total) by mouth 2 (two) times daily.    Dispense:  180 tablet    Refill:  3    Return precautions advised.  Garnette Katrinka, MD

## 2023-12-19 NOTE — Patient Instructions (Addendum)
 Blood pressure looks great today- continue valsartan  twice daily unless blood pressure comes down too low lets say 100/60 or so or you feel really lightheaded  Hoping for good psa  Recommended follow up: Return for next already scheduled visit or sooner if needed.

## 2023-12-26 ENCOUNTER — Other Ambulatory Visit (HOSPITAL_COMMUNITY): Payer: Self-pay

## 2024-03-08 ENCOUNTER — Encounter: Payer: Self-pay | Admitting: Pharmacist

## 2024-03-08 NOTE — Progress Notes (Signed)
 Pharmacy Quality Measure Review  This patient is appearing on a report for being at risk of failing the adherence measure for hypertension (ACEi/ARB) medications this calendar year.   Medication: valsartan  Last fill date: 12/19/2023 for 90 day supply, Other refills in 2025 for valsartan  - 30 day supply on 07/05/2023, 09/18/2023 and 11/14/2023 Last impactable date for [facility] is 03/12/2024  MyChart message sent to patient reminding him that valsartan  will be due to be refill soon.   Madelin Ray, PharmD Clinical Pharmacist [name] Primary Care  Population Health [phone]

## 2024-03-14 DIAGNOSIS — C61 Malignant neoplasm of prostate: Secondary | ICD-10-CM | POA: Diagnosis not present

## 2024-03-21 DIAGNOSIS — C61 Malignant neoplasm of prostate: Secondary | ICD-10-CM | POA: Diagnosis not present

## 2024-03-26 ENCOUNTER — Other Ambulatory Visit (HOSPITAL_COMMUNITY): Payer: Self-pay

## 2024-05-21 ENCOUNTER — Other Ambulatory Visit (HOSPITAL_COMMUNITY): Payer: Self-pay

## 2024-05-21 DIAGNOSIS — L570 Actinic keratosis: Secondary | ICD-10-CM | POA: Diagnosis not present

## 2024-05-21 DIAGNOSIS — D225 Melanocytic nevi of trunk: Secondary | ICD-10-CM | POA: Diagnosis not present

## 2024-05-21 DIAGNOSIS — L821 Other seborrheic keratosis: Secondary | ICD-10-CM | POA: Diagnosis not present

## 2024-05-21 DIAGNOSIS — Z85828 Personal history of other malignant neoplasm of skin: Secondary | ICD-10-CM | POA: Diagnosis not present

## 2024-05-21 DIAGNOSIS — C44519 Basal cell carcinoma of skin of other part of trunk: Secondary | ICD-10-CM | POA: Diagnosis not present

## 2024-05-21 MED ORDER — IMIQUIMOD 5 % EX CREA
0.2500 | TOPICAL_CREAM | Freq: Every evening | CUTANEOUS | 0 refills | Status: AC
  Filled 2024-05-21: qty 12, 28d supply, fill #0

## 2024-05-21 MED ORDER — IMIQUIMOD 5 % EX CREA
TOPICAL_CREAM | Freq: Every evening | CUTANEOUS | 0 refills | Status: DC
  Filled 2024-05-21: qty 12, 67d supply, fill #0

## 2024-05-22 ENCOUNTER — Encounter (HOSPITAL_COMMUNITY): Payer: Self-pay

## 2024-05-22 ENCOUNTER — Other Ambulatory Visit (HOSPITAL_COMMUNITY): Payer: Self-pay

## 2024-05-24 ENCOUNTER — Other Ambulatory Visit (HOSPITAL_COMMUNITY): Payer: Self-pay

## 2024-06-07 ENCOUNTER — Encounter: Payer: Self-pay | Admitting: Pharmacist

## 2024-06-07 NOTE — Progress Notes (Signed)
 Pharmacy Quality Measure Review  This patient is appearing on a report for being at risk of failing the adherence measure for hypertension (ACEi/ARB) medications this calendar year.   Medication: valsartan  Last fill date: 04/02/2024 for 90 day supply, so although he is on adherence list he is not due to refill valsartan  yet.  Other refills in 2025 for valsartan  - 30 day supply on 07/05/2023, 09/18/2023 and 11/14/2023 90 day supply was filled 12/26/2023 and 04/02/2024 Last impactable date per current adherence report is 12/15 but patient has already miss more than 20% of days for 2025. He has missed 91 days   Patient has medication on hand. No further intervention needed for 2025 but will follow adherence in 2026.   Madelin Ray, PharmD Clinical Pharmacist [facility] Primary Care  Population Health [phone]

## 2024-07-02 ENCOUNTER — Telehealth: Payer: Self-pay | Admitting: Family Medicine

## 2024-07-02 ENCOUNTER — Ambulatory Visit (INDEPENDENT_AMBULATORY_CARE_PROVIDER_SITE_OTHER)

## 2024-07-02 VITALS — Ht 72.0 in | Wt 149.0 lb

## 2024-07-02 DIAGNOSIS — Z0000 Encounter for general adult medical examination without abnormal findings: Secondary | ICD-10-CM

## 2024-07-02 NOTE — Patient Instructions (Addendum)
 Juan Lin,  Thank you for taking the time for your Medicare Wellness Visit. I appreciate your continued commitment to your health goals. Please review the care plan we discussed, and feel free to reach out if I can assist you further.  Please note that Annual Wellness Visits do not include a physical exam. Some assessments may be limited, especially if the visit was conducted virtually. If needed, we may recommend an in-person follow-up with your provider.  Ongoing Care Seeing your primary care provider every 3 to 6 months helps us  monitor your health and provide consistent, personalized care.   Referrals If a referral was made during today's visit and you haven't received any updates within two weeks, please contact the referred provider directly to check on the status.  Recommended Screenings:  Health Maintenance  Topic Date Due   COVID-19 Vaccine (8 - 2025-26 season) 02/26/2024   Medicare Annual Wellness Visit  07/02/2025   DTaP/Tdap/Td vaccine (3 - Td or Tdap) 11/03/2025   Pneumococcal Vaccine for age over 43  Completed   Flu Shot  Completed   Zoster (Shingles) Vaccine  Completed   Meningitis B Vaccine  Aged Out       07/02/2024    3:43 PM  Advanced Directives  Does Patient Have a Medical Advance Directive? Yes  Type of Estate Agent of [place];Living will  Does patient want to make changes to medical advance directive? Yes (Inpatient - patient requests chaplain consult to change a medical advance directive)  Copy of Healthcare Power of Attorney in Chart? No - copy requested    Vision: Annual vision screenings are recommended for early detection of glaucoma, cataracts, and diabetic retinopathy. These exams can also reveal signs of chronic conditions such as diabetes and high blood pressure.  Dental: Annual dental screenings help detect early signs of oral cancer, gum disease, and other conditions linked to overall health, including heart disease and  diabetes.

## 2024-07-02 NOTE — Progress Notes (Signed)
 "  Chief Complaint  Patient presents with   Medicare Wellness     Subjective:   Juan Lin is a 84 y.o. male who presents for a Medicare Annual Wellness Visit.  Visit info / Clinical Intake: Medicare Wellness Visit Type:: Subsequent Annual Wellness Visit Persons participating in visit and providing information:: patient Medicare Wellness Visit Mode:: Telephone If telephone:: video declined Since this visit was completed virtually, some vitals may be partially provided or unavailable. Missing vitals are due to the limitations of the virtual format.: Documented vitals are patient reported If Telephone or Video please confirm:: I connected with patient using audio/video enable telemedicine. I verified patient identity with two identifiers, discussed telehealth limitations, and patient agreed to proceed. Patient Location:: Home Provider Location:: Office Interpreter Needed?: No Pre-visit prep was completed: yes AWV questionnaire completed by patient prior to visit?: yes Date:: 07/01/24 Living arrangements:: lives with spouse/significant other Patient's Overall Health Status Rating: good Typical amount of pain: some Does pain affect daily life?: no Are you currently prescribed opioids?: no  Dietary Habits and Nutritional Risks How many meals a day?: 3 Eats fruit and vegetables daily?: yes Most meals are obtained by: preparing own meals In the last 2 weeks, have you had any of the following?: none Diabetic:: no  Functional Status Activities of Daily Living (to include ambulation/medication): Independent Ambulation: Independent with device- listed below Home Assistive Devices/Equipment: Eyeglasses Medication Administration: Independent Home Management (perform basic housework or laundry): Independent Manage your own finances?: yes Primary transportation is: driving Concerns about vision?: no *vision screening is required for WTM* Concerns about hearing?: (!) yes Uses  hearing aids?: (!) yes Hear whispered voice?: yes  Fall Screening Falls in the past year?: 0 Number of falls in past year: 0 Was there an injury with Fall?: 0 Fall Risk Category Calculator: 0 Patient Fall Risk Level: Low Fall Risk  Fall Risk Patient at Risk for Falls Due to: No Fall Risks Fall risk Follow up: Falls evaluation completed; Falls prevention discussed  Home and Transportation Safety: All rugs have non-skid backing?: (!) no All stairs or steps have railings?: yes Grab bars in the bathtub or shower?: yes Have non-skid surface in bathtub or shower?: (!) no Good home lighting?: yes Regular seat belt use?: yes Hospital stays in the last year:: (!) yes How many hospital stays:: 1 Reason: intestinal bleeding  Cognitive Assessment Difficulty concentrating, remembering, or making decisions? : no Will 6CIT or Mini Cog be Completed: yes What year is it?: 0 points What month is it?: 0 points Give patient an address phrase to remember (5 components): [address] [place], [place] About what time is it?: 0 points Count backwards from 20 to 1: 0 points Say the months of the year in reverse: 0 points Repeat the address phrase from earlier: 0 points 6 CIT Score: 0 points  Advance Directives (For Healthcare) Does Patient Have a Medical Advance Directive?: Yes Does patient want to make changes to medical advance directive?: Yes (Inpatient - patient requests chaplain consult to change a medical advance directive) Type of Advance Directive: Healthcare Power of [place]; Living will Copy of Healthcare Power of Attorney in Chart?: No - copy requested Copy of Living Will in Chart?: No - copy requested  Reviewed/Updated  Reviewed/Updated: Reviewed All (Medical, Surgical, Family, Medications, Allergies, Care Teams, Patient Goals)    Allergies (verified) Patient has no known allergies.   Current Medications (verified) Outpatient Encounter Medications as of 07/02/2024  Medication  Sig   cetirizine  (ZYRTEC )  10 MG tablet Take 1 tablet (10 mg total) by mouth daily.   enzalutamide (XTANDI) 40 MG capsule 80 mg every evening.   imiquimod  (ALDARA ) 5 % cream Apply 1/4 packet to affect area every night Monday - Friday for 4 weeks.   melatonin 1 MG TABS tablet Take 1 mg by mouth at bedtime as needed.   Multiple Vitamin (MULTIVITAMIN WITH MINERALS) TABS tablet Take 1 tablet by mouth daily.   valsartan  (DIOVAN ) 80 MG tablet Take 1 tablet (80 mg total) by mouth 2 (two) times daily.   No facility-administered encounter medications on file as of 07/02/2024.    History: Past Medical History:  Diagnosis Date   Adenoma 2008   serrated adenoma of colon   Allergy    Arthritis    Clotting disorder 2025   Diverticulosis    GERD (gastroesophageal reflux disease)    H. Pylori related-gone when treated   Hyperlipidemia    Hypertension 2024   Xtandi   Neuromuscular disorder [facility]) 2024   Prostate cancer [facility]) 1994   radical prostatectomy Dr. Candi HOUSTON   Right inguinal hernia    Seasonal allergies    Sinusitis acute    Past Surgical History:  Procedure Laterality Date   APPENDECTOMY     CATARACT EXTRACTION, BILATERAL Bilateral 2018   CHOLECYSTECTOMY  05/2009   EYE SURGERY  2019   cataracts, both eyes   HERNIA REPAIR  2023   INGUINAL HERNIA REPAIR Right 04/13/2020   Procedure: OPEN RIGHT INGUINAL HERNIA REPAIR;  Surgeon: Dasie Leonor CROME, MD;  Location: [facility];  Service: General;  Laterality: Right;   prostatectomy  1994   DR.MOHLER [facility]   TONSILLECTOMY     Family History  Problem Relation Age of Onset   Pneumonia Father        aspiration   Prostate cancer Paternal Uncle    Prostate cancer Cousin    Other Other 4       smoker. sudden death unclear cause   Colon cancer Neg Hx    Liver disease Neg Hx    Esophageal cancer Neg Hx    Social History   Occupational History    Comment: currently volunteers as musician   Occupation: retired  Tobacco  Use   Smoking status: Never   Smokeless tobacco: Never  Vaping Use   Vaping status: Never Used  Substance and Sexual Activity   Alcohol use: Yes    Alcohol/week: 8.0 standard drinks of alcohol    Types: 1 Glasses of wine, 7 Standard drinks or equivalent per week    Comment: glass wine with dinner   Drug use: Not Currently    Types: Amyl nitrate, IV   Sexual activity: Not Currently   Tobacco Counseling Counseling given: No  SDOH Screenings   Food Insecurity: No Food Insecurity (07/02/2024)  Housing: Low Risk (07/02/2024)  Transportation Needs: No Transportation Needs (07/02/2024)  Utilities: Not At Risk (07/02/2024)  Alcohol Screen: Low Risk (07/01/2024)  Depression (PHQ2-9): Low Risk (07/02/2024)  Financial Resource Strain: Low Risk (07/01/2024)  Physical Activity: Sufficiently Active (07/02/2024)  Social Connections: Socially Integrated (07/02/2024)  Stress: No Stress Concern Present (07/02/2024)  Tobacco Use: Low Risk (07/02/2024)  Health Literacy: Adequate Health Literacy (07/02/2024)   See flowsheets for full screening details  Depression Screen PHQ 2 & 9 Depression Scale- Over the past 2 weeks, how often have you been bothered by any of the following problems? Little interest or pleasure in doing things: 0 Feeling down, depressed,  or hopeless (PHQ Adolescent also includes...irritable): 0 PHQ-2 Total Score: 0 Trouble falling or staying asleep, or sleeping too much: 0 Feeling tired or having little energy: 0 Poor appetite or overeating (PHQ Adolescent also includes...weight loss): 0 Feeling bad about yourself - or that you are a failure or have let yourself or your family down: 0 Trouble concentrating on things, such as reading the newspaper or watching television (PHQ Adolescent also includes...like school work): 0 Moving or speaking so slowly that other people could have noticed. Or the opposite - being so fidgety or restless that you have been moving around a lot more than usual:  0 Thoughts that you would be better off dead, or of hurting yourself in some way: 0 PHQ-9 Total Score: 0 If you checked off any problems, how difficult have these problems made it for you to do your work, take care of things at home, or get along with other people?: Not difficult at all  Depression Treatment Depression Interventions/Treatment : Medication; Currently on Treatment; PHQ2-9 Score <4 Follow-up Not Indicated     Goals Addressed               This Visit's Progress     Patient Stated (pt-stated)        Patient stated he plans to continue being active and dancing              Objective:    Today's Vitals   07/02/24 1540  Weight: 149 lb (67.6 kg)  Height: 6' (1.829 m)   Body mass index is 20.21 kg/m.  Hearing/Vision screen Hearing Screening - Comments:: Wears hearing aids Vision Screening - Comments:: Wears eyeglasses for reading - up to date with [facility] Immunizations and Health Maintenance Health Maintenance  Topic Date Due   COVID-19 Vaccine (8 - 2025-26 season) 02/26/2024   Medicare Annual Wellness (AWV)  07/02/2025   DTaP/Tdap/Td (3 - Td or Tdap) 11/03/2025   Pneumococcal Vaccine: 50+ Years  Completed   Influenza Vaccine  Completed   Zoster Vaccines- Shingrix  Completed   Meningococcal B Vaccine  Aged Out        Assessment/Plan:  This is a routine wellness examination for Juan Lin.  Patient Care Team: [name], MD as PCP - General (Family Medicine) Nieves Cough, MD as Consulting Physician (Urology) Latanya, Corean MATSU, MD as Consulting Physician (Audiology) Cleotilde Elspeth CROME, OD (Optometry)  I have personally reviewed and noted the following in the patients chart:   Medical and social history Use of alcohol, tobacco or illicit drugs  Current medications and supplements including opioid prescriptions. Functional ability and status Nutritional status Physical activity Advanced directives List of  other physicians Hospitalizations, surgeries, and ER visits in previous 12 months Vitals Screenings to include cognitive, depression, and falls Referrals and appointments  No orders of the defined types were placed in this encounter.  In addition, I have reviewed and discussed with patient certain preventive protocols, quality metrics, and best practice recommendations. A written personalized care plan for preventive services as well as general preventive health recommendations were provided to patient.   Verdie CHRISTELLA Saba, CMA   07/02/2024   Return in 1 year (on 07/02/2025).  After Visit Summary: (MyChart) Due to this being a telephonic visit, the after visit summary with patients personalized plan was offered to patient via MyChart   Nurse Notes: Appointment(s) made: (scheduled 2027 AWV appt)  "

## 2024-07-02 NOTE — Telephone Encounter (Signed)
 Pt came in for his AWV, but there was a miscommunication with his appointment as it is supposed to be a Telephone visit.  I informed the patient that the visit was a telephone call. I offered to reschedule him for an in person appt, but he declined. I checked the pt in for his telephone visit, and he informed me he would wait for the phone call, and walked out of our clinic.

## 2024-07-18 ENCOUNTER — Encounter: Payer: PPO | Admitting: Family Medicine

## 2024-08-29 ENCOUNTER — Encounter: Admitting: Family Medicine

## 2025-07-03 ENCOUNTER — Ambulatory Visit
